# Patient Record
Sex: Male | Born: 1955
Health system: Southern US, Community
[De-identification: ages and names within clinical notes are randomized; demographics above are authoritative.]

## PROBLEM LIST (undated history)

## (undated) DIAGNOSIS — F419 Anxiety disorder, unspecified: Secondary | ICD-10-CM

## (undated) DIAGNOSIS — R519 Headache, unspecified: Secondary | ICD-10-CM

## (undated) DIAGNOSIS — G709 Myoneural disorder, unspecified: Secondary | ICD-10-CM

## (undated) DIAGNOSIS — N400 Enlarged prostate without lower urinary tract symptoms: Secondary | ICD-10-CM

## (undated) DIAGNOSIS — T8859XA Other complications of anesthesia, initial encounter: Secondary | ICD-10-CM

## (undated) DIAGNOSIS — I499 Cardiac arrhythmia, unspecified: Secondary | ICD-10-CM

## (undated) DIAGNOSIS — I10 Essential (primary) hypertension: Secondary | ICD-10-CM

## (undated) DIAGNOSIS — E7800 Pure hypercholesterolemia, unspecified: Secondary | ICD-10-CM

## (undated) DIAGNOSIS — Z8679 Personal history of other diseases of the circulatory system: Secondary | ICD-10-CM

## (undated) DIAGNOSIS — G4730 Sleep apnea, unspecified: Secondary | ICD-10-CM

## (undated) DIAGNOSIS — I493 Ventricular premature depolarization: Secondary | ICD-10-CM

## (undated) DIAGNOSIS — M1990 Unspecified osteoarthritis, unspecified site: Secondary | ICD-10-CM

## (undated) DIAGNOSIS — E119 Type 2 diabetes mellitus without complications: Secondary | ICD-10-CM

## (undated) DIAGNOSIS — E785 Hyperlipidemia, unspecified: Secondary | ICD-10-CM

## (undated) DIAGNOSIS — H548 Legal blindness, as defined in USA: Secondary | ICD-10-CM

## (undated) DIAGNOSIS — I2510 Atherosclerotic heart disease of native coronary artery without angina pectoris: Secondary | ICD-10-CM

## (undated) DIAGNOSIS — J189 Pneumonia, unspecified organism: Secondary | ICD-10-CM

## (undated) DIAGNOSIS — G629 Polyneuropathy, unspecified: Secondary | ICD-10-CM

## (undated) DIAGNOSIS — J449 Chronic obstructive pulmonary disease, unspecified: Secondary | ICD-10-CM

## (undated) HISTORY — DX: Anxiety disorder, unspecified: F41.9

## (undated) HISTORY — PX: HERNIA REPAIR: SHX51

## (undated) HISTORY — DX: Legal blindness, as defined in USA: H54.8

## (undated) HISTORY — DX: Hyperlipidemia, unspecified: E78.5

## (undated) HISTORY — PX: CARDIAC SURGERY: SHX584

## (undated) HISTORY — PX: CARPAL TUNNEL RELEASE: SHX101

## (undated) HISTORY — PX: ULNAR NERVE REPAIR: SHX2594

## (undated) HISTORY — DX: Essential (primary) hypertension: I10

## (undated) HISTORY — PX: TONSILLECTOMY: SUR1361

## (undated) HISTORY — PX: JOINT REPLACEMENT: SHX530

## (undated) HISTORY — DX: Type 2 diabetes mellitus without complications: E11.9

## (undated) HISTORY — PX: BACK SURGERY: SHX140

## (undated) HISTORY — PX: HAND SURGERY: SHX662

---

## 1998-10-08 DIAGNOSIS — I213 ST elevation (STEMI) myocardial infarction of unspecified site: Secondary | ICD-10-CM

## 1998-10-08 HISTORY — DX: ST elevation (STEMI) myocardial infarction of unspecified site: I21.3

## 2007-05-20 ENCOUNTER — Ambulatory Visit: Payer: Self-pay | Admitting: Internal Medicine

## 2009-08-21 ENCOUNTER — Emergency Department: Payer: Self-pay | Admitting: Emergency Medicine

## 2012-08-13 ENCOUNTER — Ambulatory Visit: Payer: Self-pay | Admitting: Pain Medicine

## 2013-08-28 DIAGNOSIS — R7989 Other specified abnormal findings of blood chemistry: Secondary | ICD-10-CM | POA: Insufficient documentation

## 2013-08-28 DIAGNOSIS — G8929 Other chronic pain: Secondary | ICD-10-CM | POA: Insufficient documentation

## 2013-08-28 DIAGNOSIS — L920 Granuloma annulare: Secondary | ICD-10-CM | POA: Insufficient documentation

## 2013-08-28 DIAGNOSIS — E7800 Pure hypercholesterolemia, unspecified: Secondary | ICD-10-CM | POA: Insufficient documentation

## 2013-08-28 DIAGNOSIS — I2510 Atherosclerotic heart disease of native coronary artery without angina pectoris: Secondary | ICD-10-CM | POA: Insufficient documentation

## 2013-08-28 DIAGNOSIS — N400 Enlarged prostate without lower urinary tract symptoms: Secondary | ICD-10-CM | POA: Insufficient documentation

## 2013-08-28 DIAGNOSIS — I10 Essential (primary) hypertension: Secondary | ICD-10-CM | POA: Insufficient documentation

## 2013-08-28 DIAGNOSIS — G629 Polyneuropathy, unspecified: Secondary | ICD-10-CM | POA: Insufficient documentation

## 2013-08-28 DIAGNOSIS — G5622 Lesion of ulnar nerve, left upper limb: Secondary | ICD-10-CM | POA: Insufficient documentation

## 2013-08-28 DIAGNOSIS — H543 Unqualified visual loss, both eyes: Secondary | ICD-10-CM | POA: Insufficient documentation

## 2013-08-28 DIAGNOSIS — F419 Anxiety disorder, unspecified: Secondary | ICD-10-CM | POA: Insufficient documentation

## 2013-11-12 DIAGNOSIS — M67439 Ganglion, unspecified wrist: Secondary | ICD-10-CM | POA: Insufficient documentation

## 2013-12-11 DIAGNOSIS — G4733 Obstructive sleep apnea (adult) (pediatric): Secondary | ICD-10-CM | POA: Insufficient documentation

## 2013-12-11 DIAGNOSIS — F5104 Psychophysiologic insomnia: Secondary | ICD-10-CM | POA: Insufficient documentation

## 2013-12-11 DIAGNOSIS — G43909 Migraine, unspecified, not intractable, without status migrainosus: Secondary | ICD-10-CM | POA: Insufficient documentation

## 2014-01-28 DIAGNOSIS — G5601 Carpal tunnel syndrome, right upper limb: Secondary | ICD-10-CM | POA: Insufficient documentation

## 2014-12-06 DIAGNOSIS — Z9862 Peripheral vascular angioplasty status: Secondary | ICD-10-CM | POA: Insufficient documentation

## 2015-01-17 ENCOUNTER — Ambulatory Visit
Admit: 2015-01-17 | Disposition: A | Discharge status: Home / Self Care | Payer: Self-pay | Attending: Cardiology | Admitting: Cardiology

## 2016-01-11 ENCOUNTER — Ambulatory Visit
Admission: EM | Admit: 2016-01-11 | Discharge: 2016-01-11 | Disposition: A | Discharge status: Home / Self Care | Payer: Managed Care, Other (non HMO) | Attending: Family Medicine | Admitting: Family Medicine

## 2016-01-11 ENCOUNTER — Encounter: Payer: Self-pay | Admitting: *Deleted

## 2016-01-11 ENCOUNTER — Ambulatory Visit (INDEPENDENT_AMBULATORY_CARE_PROVIDER_SITE_OTHER): Payer: Managed Care, Other (non HMO)

## 2016-01-11 DIAGNOSIS — S8011XA Contusion of right lower leg, initial encounter: Secondary | ICD-10-CM

## 2016-01-11 DIAGNOSIS — S81811A Laceration without foreign body, right lower leg, initial encounter: Secondary | ICD-10-CM

## 2016-01-11 HISTORY — DX: Sleep apnea, unspecified: G47.30

## 2016-01-11 HISTORY — DX: Type 2 diabetes mellitus without complications: E11.9

## 2016-01-11 HISTORY — DX: Pure hypercholesterolemia, unspecified: E78.00

## 2016-01-11 HISTORY — DX: Atherosclerotic heart disease of native coronary artery without angina pectoris: I25.10

## 2016-01-11 HISTORY — DX: Polyneuropathy, unspecified: G62.9

## 2016-01-11 HISTORY — DX: Chronic obstructive pulmonary disease, unspecified: J44.9

## 2016-01-11 HISTORY — DX: Essential (primary) hypertension: I10

## 2016-01-11 HISTORY — DX: Benign prostatic hyperplasia without lower urinary tract symptoms: N40.0

## 2016-01-11 MED ORDER — CEPHALEXIN 500 MG PO CAPS: 500.0000 mg | ORAL_CAPSULE | Freq: Three times a day (TID) | ORAL | Status: AC

## 2016-01-11 MED ORDER — LIDOCAINE HCL (PF) 1 % IJ SOLN: 10.0000 mL | Freq: Once | INTRAMUSCULAR | Status: DC

## 2016-01-11 MED ORDER — BACITRACIN ZINC 500 UNIT/GM EX OINT: TOPICAL_OINTMENT | Freq: Once | CUTANEOUS | Status: DC

## 2016-01-11 NOTE — Discharge Instructions (Signed)
Take medication as prescribed. Elevate. Keep clean. Apply ice. Clean daily with soap and water and then apply topical antibiotic ointment such as neosporin.   Follow up with your primary care physician or return to Urgent care in 10 days for suture removal.   Follow up with your primary care physician this week as needed. Return to Urgent care or ER sooner for redness, pain, drainage, new or worsening concerns.    Contusion A contusion is a deep bruise. Contusions are the result of a blunt injury to tissues and muscle fibers under the skin. The injury causes bleeding under the skin. The skin overlying the contusion may turn blue, purple, or yellow. Minor injuries will give you a painless contusion, but more severe contusions may stay painful and swollen for a few weeks.  CAUSES  This condition is usually caused by a blow, trauma, or direct force to an area of the body. SYMPTOMS  Symptoms of this condition include:  Swelling of the injured area.  Pain and tenderness in the injured area.  Discoloration. The area may have redness and then turn blue, purple, or yellow. DIAGNOSIS  This condition is diagnosed based on a physical exam and medical history. An X-ray, CT scan, or MRI may be needed to determine if there are any associated injuries, such as broken bones (fractures). TREATMENT  Specific treatment for this condition depends on what area of the body was injured. In general, the best treatment for a contusion is resting, icing, applying pressure to (compression), and elevating the injured area. This is often called the RICE strategy. Over-the-counter anti-inflammatory medicines may also be recommended for pain control.  HOME CARE INSTRUCTIONS   Rest the injured area.  If directed, apply ice to the injured area:  Put ice in a plastic bag.  Place a towel between your skin and the bag.  Leave the ice on for 20 minutes, 2-3 times per day.  If directed, apply light compression to the  injured area using an elastic bandage. Make sure the bandage is not wrapped too tightly. Remove and reapply the bandage as directed by your health care provider.  If possible, raise (elevate) the injured area above the level of your heart while you are sitting or lying down.  Take over-the-counter and prescription medicines only as told by your health care provider. SEEK MEDICAL CARE IF:  Your symptoms do not improve after several days of treatment.  Your symptoms get worse.  You have difficulty moving the injured area. SEEK IMMEDIATE MEDICAL CARE IF:   You have severe pain.  You have numbness in a hand or foot.  Your hand or foot turns pale or cold.   This information is not intended to replace advice given to you by your health care provider. Make sure you discuss any questions you have with your health care provider.   Document Released: 07/04/2005 Document Revised: 06/15/2015 Document Reviewed: 02/09/2015 Elsevier Interactive Patient Education 2016 [employer]  Laceration Care, Adult A laceration is a cut that goes through all of the layers of the skin and into the tissue that is right under the skin. Some lacerations heal on their own. Others need to be closed with stitches (sutures), staples, skin adhesive strips, or skin glue. Proper laceration care minimizes the risk of infection and helps the laceration to heal better. HOW TO CARE FOR YOUR LACERATION If sutures or staples were used:  Keep the wound clean and dry.  If you were given a bandage (dressing), you  should change it at least one time per day or as told by your health care provider. You should also change it if it becomes wet or dirty.  Keep the wound completely dry for the first 24 hours or as told by your health care provider. After that time, you may shower or bathe. However, make sure that the wound is not soaked in water until after the sutures or staples have been removed.  Clean the wound one time each  day or as told by your health care provider:  Wash the wound with soap and water.  Rinse the wound with water to remove all soap.  Pat the wound dry with a clean towel. Do not rub the wound.  After cleaning the wound, apply a thin layer of antibiotic ointmentas told by your health care provider. This will help to prevent infection and keep the dressing from sticking to the wound.  Have the sutures or staples removed as told by your health care provider. If skin adhesive strips were used:  Keep the wound clean and dry.  If you were given a bandage (dressing), you should change it at least one time per day or as told by your health care provider. You should also change it if it becomes dirty or wet.  Do not get the skin adhesive strips wet. You may shower or bathe, but be careful to keep the wound dry.  If the wound gets wet, pat it dry with a clean towel. Do not rub the wound.  Skin adhesive strips fall off on their own. You may trim the strips as the wound heals. Do not remove skin adhesive strips that are still stuck to the wound. They will fall off in time. If skin glue was used:  Try to keep the wound dry, but you may briefly wet it in the shower or bath. Do not soak the wound in water, such as by swimming.  After you have showered or bathed, gently pat the wound dry with a clean towel. Do not rub the wound.  Do not do any activities that will make you sweat heavily until the skin glue has fallen off on its own.  Do not apply liquid, cream, or ointment medicine to the wound while the skin glue is in place. Using those may loosen the film before the wound has healed.  If you were given a bandage (dressing), you should change it at least one time per day or as told by your health care provider. You should also change it if it becomes dirty or wet.  If a dressing is placed over the wound, be careful not to apply tape directly over the skin glue. Doing that may cause the glue to be  pulled off before the wound has healed.  Do not pick at the glue. The skin glue usually remains in place for 5-10 days, then it falls off of the skin. General Instructions  Take over-the-counter and prescription medicines only as told by your health care provider.  If you were prescribed an antibiotic medicine or ointment, take or apply it as told by your doctor. Do not stop using it even if your condition improves.  To help prevent scarring, make sure to cover your wound with sunscreen whenever you are outside after stitches are removed, after adhesive strips are removed, or when glue remains in place and the wound is healed. Make sure to wear a sunscreen of at least 30 SPF.  Do not scratch or  pick at the wound.  Keep all follow-up visits as told by your health care provider. This is important.  Check your wound every day for signs of infection. Watch for:  Redness, swelling, or pain.  Fluid, blood, or pus.  Raise (elevate) the injured area above the level of your heart while you are sitting or lying down, if possible. SEEK MEDICAL CARE IF:  You received a tetanus shot and you have swelling, severe pain, redness, or bleeding at the injection site.  You have a fever.  A wound that was closed breaks open.  You notice a bad smell coming from your wound or your dressing.  You notice something coming out of the wound, such as wood or glass.  Your pain is not controlled with medicine.  You have increased redness, swelling, or pain at the site of your wound.  You have fluid, blood, or pus coming from your wound.  You notice a change in the color of your skin near your wound.  You need to change the dressing frequently due to fluid, blood, or pus draining from the wound.  You develop a new rash.  You develop numbness around the wound. SEEK IMMEDIATE MEDICAL CARE IF:  You develop severe swelling around the wound.  Your pain suddenly increases and is severe.  You develop  painful lumps near the wound or on skin that is anywhere on your body.  You have a red streak going away from your wound.  The wound is on your hand or foot and you cannot properly move a finger or toe.  The wound is on your hand or foot and you notice that your fingers or toes look pale or bluish.   This information is not intended to replace advice given to you by your health care provider. Make sure you discuss any questions you have with your health care provider.   Document Released: 09/24/2005 Document Revised: 02/08/2015 Document Reviewed: 09/20/2014 Elsevier Interactive Patient Education [employer]

## 2016-01-11 NOTE — ED Notes (Signed)
Patient struck his right leg just below his knee today. A small laceration is present . The patient has had a right knee replacement and is concerned that it may be damaged.

## 2016-01-11 NOTE — ED Provider Notes (Signed)
[facility]  ____________________________________________  Time seen: Approximately 5:49 PM  I have reviewed the triage vital signs and the nursing notes.   HISTORY  Chief Complaint Leg Injury   HPI Bryan Blair is a 60 y.o. male presents for the complaint of right anterior knee pain with laceration. 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 Patient states that he had some loose boards and was using his hammer to fix the boards. Patient states that he hit the board with a hammer and it bounced off and bounced into his right lower knee. Patient states that this impacted causing laceration. Patient states that he does have some pain present described as 3 out of 10 described as mild.  states pain is primarily with direct palpation. Denies pain radiation. Denies numbness or tingling sensation. Patient reports that he was concerned as he has had a right total knee replacement and states that the impact was just below the plate. Denies fall to the ground. Denies other pain or injury.  Reports has remained ambulatory since. Denies head injury or loss consciousness. Denies neck or back pain. Reports last tetanus immunization was last year.  PCP [facility]    Past Medical History  Diagnosis Date  . Coronary artery disease   . Hypertension   . COPD (chronic obstructive pulmonary disease) (HCC)   . Diabetes mellitus without complication (HCC)   . Sleep apnea   . Hypercholesteremia   . BPH (benign prostatic hyperplasia)   . Neuropathy (HCC)     There are no active problems to display for this patient.   Past Surgical History  Procedure Laterality Date  . Cardiac surgery    . Joint replacement    . Hernia repair    . Tonsillectomy    . Back surgery    . Hand surgery      Current Outpatient Rx  Name  Route  Sig  Dispense  Refill  . ALPRAZolam (XANAX) 0.5 MG tablet   Oral   Take 0.5 mg by mouth 3 (three) times daily as  needed for anxiety.         Marland Kitchen aspirin 81 MG tablet   Oral   Take 81 mg by mouth 2 (two) times daily.         Marland Kitchen glipiZIDE (GLUCOTROL) 10 MG tablet   Oral   Take 10 mg by mouth 2 (two) times daily before a meal.         . isosorbide mononitrate (IMDUR) 30 MG 24 hr tablet   Oral   Take 30 mg by mouth daily.         . tamsulosin (FLOMAX) 0.4 MG CAPS capsule   Oral   Take 0.4 mg by mouth daily.         . carisoprodol (SOMA) 350 MG tablet   Oral   Take 350 mg by mouth 4 (four) times daily as needed for muscle spasms.         . nitroGLYCERIN (NITROSTAT) 0.4 MG SL tablet   Sublingual   Place 0.4 mg under the tongue every 5 (five) minutes as needed for chest pain.           Allergies Iodine; Shellfish allergy; Sodium hyaluronate; Prochlorperazine maleate; and Venomil honey bee venom  History reviewed. No pertinent family history.  Social History Social History  Substance Use Topics  . Smoking status: Current Every Day Smoker  . Smokeless tobacco: Former Neurosurgeon  .  Alcohol Use: No    Review of Systems Constitutional: No fever/chills Eyes: No visual changes. ENT: No sore throat. Cardiovascular: Denies chest pain. Respiratory: Denies shortness of breath. Gastrointestinal: No abdominal pain.  No nausea, no vomiting.  No diarrhea.  No constipation. Genitourinary: Negative for dysuria. Musculoskeletal: Negative for back pain. Skin: Negative for rash. laceration as above.  Neurological: Negative for headaches, focal weakness or numbness.  10-point ROS otherwise negative.  ____________________________________________   PHYSICAL EXAM:  VITAL SIGNS: ED Triage Vitals  Enc Vitals Group     BP 01/11/16 1654 114/100 mmHg     Pulse Rate 01/11/16 1654 88     Resp 01/11/16 1654 18     Temp 01/11/16 1654 98.1 F (36.7 C)     Temp Source 01/11/16 1654 Oral     SpO2 01/11/16 1654 98 %     Weight 01/11/16 1654 300 lb (136.079 kg)     Height 01/11/16 1654   (1.702 m)     Head Cir --      Peak Flow --      Pain Score 01/11/16 1710 0     Pain Loc --      Pain Edu? --      Excl. in GC? --    Today's Vitals   01/11/16 1654 01/11/16 1710 01/11/16 1824  BP: 114/100  119/85  Pulse: 88  96  Temp: 98.1 F (36.7 C)    TempSrc: Oral    Resp: 18    Height:  (1.702 m)    Weight: 300 lb (136.079 kg)    SpO2: 98%    PainSc:  0-No pain 4      Constitutional: Alert and oriented. Well appearing and in no acute distress. Eyes: Conjunctivae are normal. PERRL. EOMI. Head: Atraumatic.  Nose: No congestion/rhinnorhea.  Mouth/Throat: Mucous membranes are moist.   Neck: No stridor.  No cervical spine tenderness to palpation. Cardiovascular: Normal rate, regular rhythm. Grossly normal heart sounds.  Good peripheral circulation. Respiratory: Normal respiratory effort.  No retractions. Lungs CTAB. Gastrointestinal: Soft and nontender.  Musculoskeletal: No lower or upper extremity tenderness nor edema.   Bilateral pedal pulses equal and easily palpated. Right anterior proximal tibial mild to moderate tenderness to palpation, with 3cm laceration present with mild surrounding ecchymosis, no erythema, no exudate or drainage, mild active bleeding, no foreign bodies visualized. Right knee nontender to palpation, full range of motion present and no pain with range of motion. Knee appears stable. Right lower extremity otherwise nontender. Ambulatory in room with steady gait.  Neurologic:  Normal speech and language. No gross focal neurologic deficits are appreciated. No gait instability. Skin:  Skin is warm, dry and intact. No rash noted. Psychiatric: Mood and affect are normal. Speech and behavior are normal.  ____________________________________________   LABS (all labs ordered are listed, but only abnormal results are displayed)  Labs Reviewed - No data to display  RADIOLOGY  EXAM: RIGHT TIBIA AND FIBULA - 2 VIEW  COMPARISON:  None.  FINDINGS: The marker indicates laceration is in the vicinity of the tibial tubercle.  No unexpected foreign body or gas tracking in the soft tissues. No fracture or complicating feature related to the tibial portion of the total knee prosthesis.  IMPRESSION: 1. No bony abnormality or complicating feature related to the tibial portion of the total knee prosthesis.   Electronically Signed By: Gaylyn Rong M.D. On: 01/11/2016 18:12 I, Renford Dills, personally discussed these images and results by phone with the  on-call radiologist and used this discussion as part of my medical decision making.   ____________________________________________   PROCEDURES  Procedure(s) performed:  Procedure(s) performed:  Procedure explained and verbal consent obtained. Consent: Verbal consent obtained. Written consent not obtained. Risks and benefits: risks, benefits and alternatives were discussed Patient identity confirmed: verbally with patient and hospital-assigned identification number  Consent given by: patient   Laceration Repair Location: right anterior proximal tibia Length: 3cm Foreign bodies: no foreign bodies Tendon involvement: none Nerve involvement: none Preparation: Patient was prepped and draped in the usual sterile fashion. Anesthesia with 1% Lidocaine 6 mls Irrigation solution: saline and betadine Irrigation method: jet lavage Amount of cleaning: copious Repaired with 4-0 nylon  Number of sutures: 4 Technique: simple interrupted  Approximation: loose Patient tolerate well. Wound well approximated post repair.  Antibiotic ointment and dressing applied.  Wound care instructions provided.  Observe for any signs of infection or other problems.     ____________________________________________   INITIAL IMPRESSION / ASSESSMENT AND PLAN / ED COURSE  Pertinent labs & imaging results that were available during my care of the patient were reviewed by me  and considered in my medical decision making (see chart for details).  Well-appearing patient. No acute distress. Presents for the complaints of right anterior proximal tibial pain and laceration post mechanical injury, but accidentally hitting himself with a hammer. Denies fall to the ground. Denies other pain or injury. Tetanus immunization up-to-date. Will about evaluate tib-fib x-ray. Laceration repaired. Xray negative per radiologist. Patient tolerated well. As patient with lower extremity laceration and diabetic will place patient on oral cephalexin. Denies need for pain medication. Encouraged wound monitoring and discussed cleaning. Discussed return to urgent care or follow up with PCP in 10 days for suture removal. Discussed indication, risks and benefits of medications with patient.  Discussed follow up with Primary care physician this week. Discussed follow up and return parameters including no resolution or any worsening concerns. Patient verbalized understanding and agreed to plan.   ____________________________________________   FINAL CLINICAL IMPRESSION(S) / ED DIAGNOSES  Final diagnoses:  Contusion, lower leg, right, initial encounter  Laceration of lower leg, right, initial encounter      Note: This dictation was prepared with Dragon dictation along with smaller phrase technology. Any transcriptional errors that result from this process are unintentional.    Renford DillsLindsey Aemon Koeller, NP 01/11/16 1918

## 2016-08-24 DIAGNOSIS — E1142 Type 2 diabetes mellitus with diabetic polyneuropathy: Secondary | ICD-10-CM | POA: Insufficient documentation

## 2017-04-12 IMAGING — CR DG TIBIA/FIBULA 2V*R*
2 series · 2 of 2 positions shown · non-contrast
Comparison: None.

CLINICAL DATA: Anterior laceration below the knee after injury.

EXAM:
RIGHT TIBIA AND FIBULA - 2 VIEW

[tibia ap]
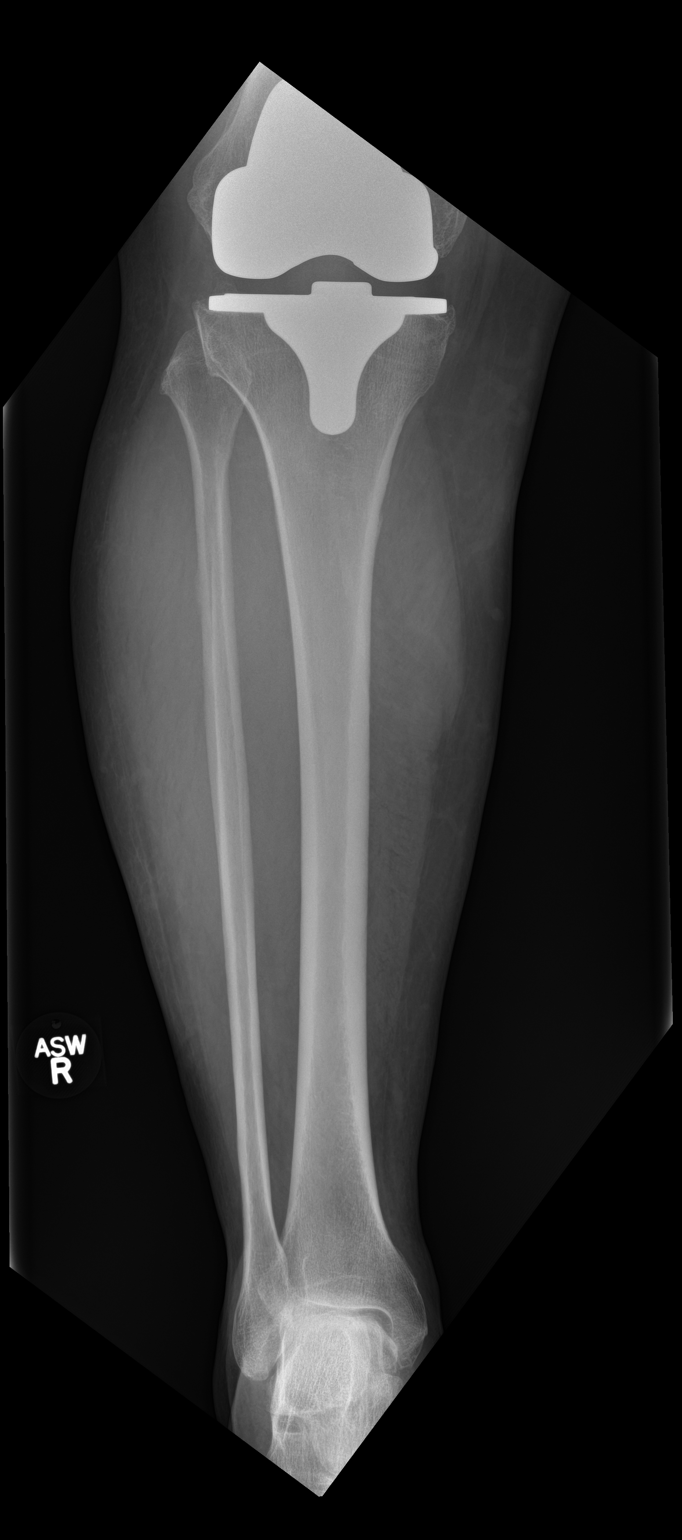

[tibia lat]
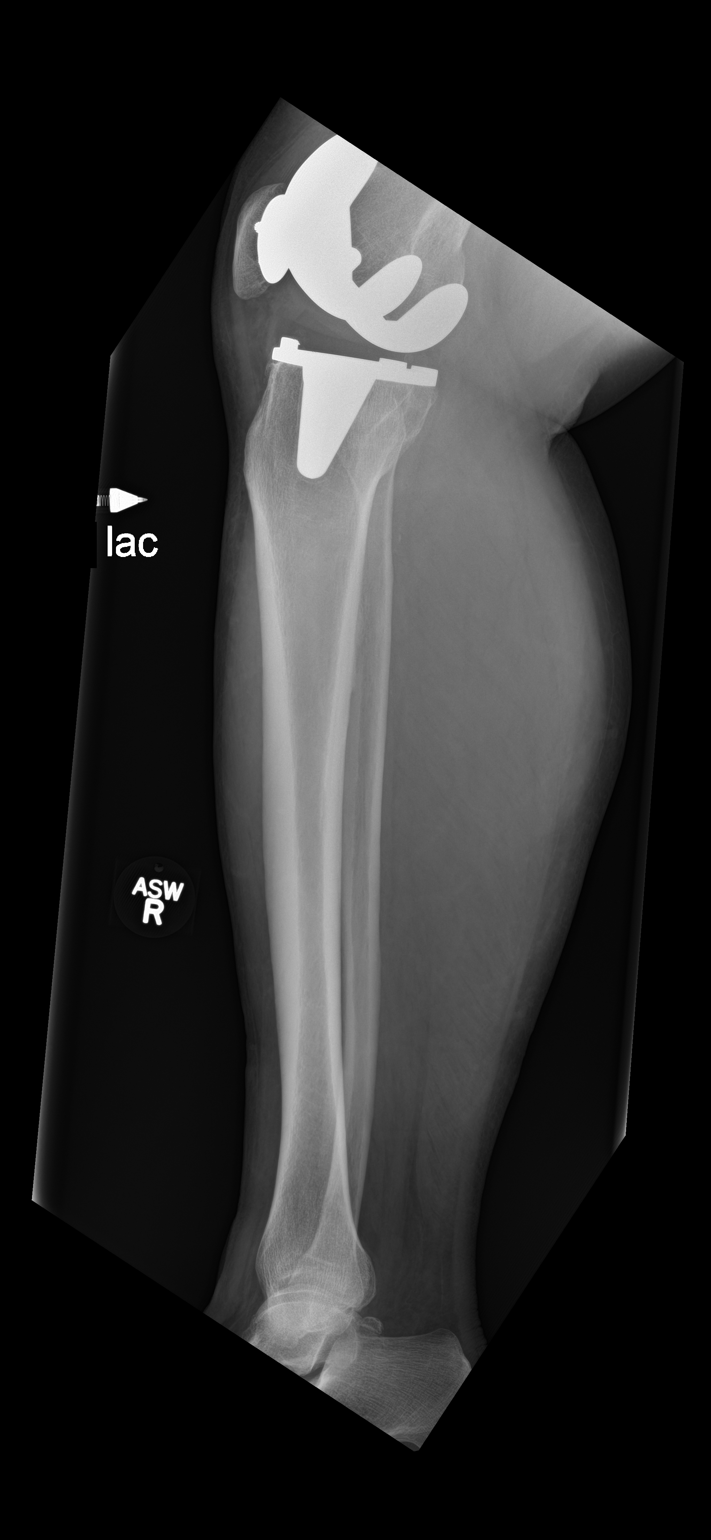

[2 of 2 positions shown; findings below may reference images not displayed]

FINDINGS: The marker indicates laceration is in the vicinity of the tibial
tubercle.

No unexpected foreign body or gas tracking in the soft tissues. No
fracture or complicating feature related to the tibial portion of
the total knee prosthesis.
IMPRESSION: 1. No bony abnormality or complicating feature related to the tibial
portion of the total knee prosthesis.

## 2019-07-13 ENCOUNTER — Encounter: Payer: Self-pay | Admitting: Internal Medicine

## 2020-07-04 DIAGNOSIS — H548 Legal blindness, as defined in USA: Secondary | ICD-10-CM | POA: Insufficient documentation

## 2020-07-04 DIAGNOSIS — J449 Chronic obstructive pulmonary disease, unspecified: Secondary | ICD-10-CM | POA: Insufficient documentation

## 2020-07-04 DIAGNOSIS — I252 Old myocardial infarction: Secondary | ICD-10-CM | POA: Insufficient documentation

## 2020-10-10 ENCOUNTER — Ambulatory Visit: Payer: Managed Care, Other (non HMO) | Admitting: Cardiology

## 2020-10-18 DIAGNOSIS — Z23 Encounter for immunization: Secondary | ICD-10-CM | POA: Diagnosis not present

## 2020-10-18 DIAGNOSIS — E119 Type 2 diabetes mellitus without complications: Secondary | ICD-10-CM | POA: Diagnosis not present

## 2020-10-18 DIAGNOSIS — Z720 Tobacco use: Secondary | ICD-10-CM | POA: Diagnosis not present

## 2020-10-18 DIAGNOSIS — R69 Illness, unspecified: Secondary | ICD-10-CM | POA: Diagnosis not present

## 2020-10-18 DIAGNOSIS — J449 Chronic obstructive pulmonary disease, unspecified: Secondary | ICD-10-CM | POA: Diagnosis not present

## 2020-10-18 DIAGNOSIS — H548 Legal blindness, as defined in USA: Secondary | ICD-10-CM | POA: Diagnosis not present

## 2020-10-18 DIAGNOSIS — Z6841 Body Mass Index (BMI) 40.0 and over, adult: Secondary | ICD-10-CM | POA: Diagnosis not present

## 2020-10-18 DIAGNOSIS — I252 Old myocardial infarction: Secondary | ICD-10-CM | POA: Diagnosis not present

## 2020-10-18 DIAGNOSIS — I200 Unstable angina: Secondary | ICD-10-CM | POA: Diagnosis not present

## 2020-11-17 ENCOUNTER — Encounter: Payer: Self-pay | Admitting: Cardiology

## 2020-11-17 ENCOUNTER — Ambulatory Visit: Payer: Medicare HMO | Admitting: Cardiology

## 2020-11-17 ENCOUNTER — Encounter: Payer: Self-pay | Admitting: *Deleted

## 2020-11-17 VITALS — BP 126/70 | HR 68 | Ht 67.0 in | Wt 293.0 lb

## 2020-11-17 DIAGNOSIS — E782 Mixed hyperlipidemia: Secondary | ICD-10-CM

## 2020-11-17 DIAGNOSIS — I25119 Atherosclerotic heart disease of native coronary artery with unspecified angina pectoris: Secondary | ICD-10-CM

## 2020-11-17 DIAGNOSIS — Z720 Tobacco use: Secondary | ICD-10-CM | POA: Diagnosis not present

## 2020-11-17 NOTE — Patient Instructions (Signed)
Your physician wants you to follow-up in: 1 YEAR WITH DR MCDOWELL  You will receive a reminder letter in the mail two months in advance. If you don't receive a letter, please call our office to schedule the follow-up appointment.   Your physician has recommended you make the following change in your medication:   DECREASE CRESTOR 10 MG DAILY    Thank you for choosing [facility]!!

## 2020-11-17 NOTE — Progress Notes (Signed)
Cardiology Office Note  Date: 11/17/2020   ID: Bryan Blair, DOB 07-06-1956, MRN [id number]  PCP:  [facility]  Cardiologist:  Nona Dell, MD Electrophysiologist:  None   Chief Complaint  Patient presents with  . Establish cardiac follow-up    History of Present Illness: Bryan Blair is a 65 y.o. male referred for cardiology consultation by Ms. Boles PA-C with [facility] to establish cardiac follow-up.  He reports occasional angina symptoms or nitroglycerin use, but overall tells me that he has been doing well.  He moved to [place] from [place], previously lived in [place], and prior to that was originally from [place].  Cardiac history is noted below.  He was previously followed by the [facility] (Dr. Darrold Junker) and reportedly follow-up cardiac catheterization in April 2016 revealed patent left circumflex stent site and otherwise nonobstructive disease.  He was then seen by [facility] in 2019.  Follow-up cardiac catheterization at that time reported minor coronary artery disease with negative IFR of the mid circumflex (1.0).  I reviewed his medications which are outlined below.  He has been concerned about possible neuropathy side effects related to Crestor, and prior to that Lipitor.  His last LDL was 46 in 2020, we are requesting his most recent lab work from [facility].  I have asked him to cut Crestor to 10 mg daily for now.  I personally reviewed his ECG which shows normal sinus rhythm.   Past Medical History:  Diagnosis Date  . Anxiety   . BPH (benign prostatic hyperplasia)   . COPD (chronic obstructive pulmonary disease) (HCC)   . Coronary artery disease    Angioplasty LAD 1995, angioplasty circumflex 1996, stent intervention circumflex 2000  . Essential hypertension   . Hyperlipidemia   . Legally blind   . Neuropathy   . Sleep apnea   . STEMI (ST elevation myocardial infarction) (HCC) 2000  . Type 2 diabetes mellitus  (HCC)     Past Surgical History:  Procedure Laterality Date  . BACK SURGERY    . HAND SURGERY    . HERNIA REPAIR    . JOINT REPLACEMENT    . TONSILLECTOMY      Current Outpatient Medications  Medication Sig Dispense Refill  . albuterol (VENTOLIN HFA) 108 (90 Base) MCG/ACT inhaler albuterol sulfate HFA 90 mcg/actuation aerosol inhaler    . ALPRAZolam (XANAX) 0.5 MG tablet Take 0.5 mg by mouth 3 (three) times daily as needed for anxiety.    Marland Kitchen aspirin 81 MG tablet Take 81 mg by mouth 2 (two) times daily.    . famotidine (PEPCID) 20 MG tablet Take 20 mg by mouth daily as needed.    . Fluticasone-Umeclidin-Vilant (TRELEGY ELLIPTA) 100-62.5-25 MCG/INH AEPB Inhale into the lungs daily.    Marland Kitchen glipiZIDE (GLUCOTROL) 10 MG tablet Take 10 mg by mouth 2 (two) times daily before a meal.    . guaiFENesin (MUCINEX) 600 MG 12 hr tablet Take 600 mg by mouth 2 (two) times daily.    Marland Kitchen ibuprofen (ADVIL) 200 MG tablet Take 200 mg by mouth every 8 (eight) hours as needed.    . isosorbide mononitrate (IMDUR) 30 MG 24 hr tablet Take 30 mg by mouth daily.    Marland Kitchen losartan (COZAAR) 25 MG tablet Take 25 mg by mouth daily.    . Multiple Vitamin (MULTIVITAMIN WITH MINERALS) TABS tablet Take 1 tablet by mouth daily.    . nitroGLYCERIN (NITROSTAT) 0.4 MG SL tablet Place 0.4 mg under the tongue every  5 (five) minutes as needed for chest pain.    . pioglitazone (ACTOS) 45 MG tablet Take 45 mg by mouth daily.    . rosuvastatin (CRESTOR) 20 MG tablet Take 20 mg by mouth daily.    . tamsulosin (FLOMAX) 0.4 MG CAPS capsule Take 0.4 mg by mouth daily.     No current facility-administered medications for this visit.   Allergies:  Iodine, Shellfish allergy, Sodium hyaluronate (avian), Prochlorperazine maleate, and Venomil honey bee venom [honey bee venom]   Social History: The patient  reports that he has been smoking cigarettes. He has quit using smokeless tobacco. He reports that he does not drink alcohol and does not use  drugs.   Family History: The patient's family history includes Heart disease in his father and mother; Stroke in his father.   ROS: Peripheral neuropathy affecting feet and lower legs.  No palpitations or syncope.  Physical Exam: VS:  BP 126/70 (BP Location: Left Arm)   Pulse 68   Ht 5\' 7"  (1.702 m)   Wt 293 lb (132.9 kg)   SpO2 96%   BMI 45.89 kg/m , BMI Body mass index is 45.89 kg/m.  Wt Readings from Last 3 Encounters:  11/17/20 293 lb (132.9 kg)  01/11/16 300 lb (136.1 kg)    General: Morbidly obese male, appears comfortable at rest. HEENT: Conjunctiva and lids normal, wearing a mask. Neck: Supple, no elevated JVP or carotid bruits, no thyromegaly. Lungs: Clear to auscultation, nonlabored breathing at rest. Cardiac: Regular rate and rhythm, no S3 or significant systolic murmur, no pericardial rub. Abdomen: Soft, nontender, bowel sounds present. Extremities: Mild lower leg edema, distal pulses 2+. Skin: Warm and dry. Musculoskeletal: No kyphosis. Neuropsychiatric: Alert and oriented x3, affect grossly appropriate.  ECG:  No old tracing available for review today.  Recent Labwork:  January 2020: Cholesterol 107, triglycerides 77, HDL 46, LDL 46, potassium 3.9, BUN 14, creatinine 0.98, AST 17, ALT 22 August 2019: Hemoglobin A1c 6.9%  Other Studies Reviewed Today:  No interval cardiac testing for review today.  Assessment and Plan:  1.  CAD status post LAD angioplasty in 1995, circumflex angioplasty in 1996, and most recently stent intervention to the circumflex in 2000 in the setting of STEMI.  Cardiac catheterization in 2019 at [facility] showed patent circumflex stent site and otherwise minor disease.  He reports intermittent angina symptoms, not progressive on medical therapy.  ECG is normal today.  Continue aspirin, Imdur, Cozaar, Crestor, and as needed nitroglycerin.  Currently not on beta-blocker with resting heart rate in the 60s.  2.  Mixed hyperlipidemia by  history, last LDL 46 in 2020.  Decrease Crestor to 10 mg daily given patient concerns about potential side effects.  Requesting most recent lab work from 2021.  3.  Tobacco abuse, smoking cessation would also be beneficial.  Medication Adjustments/Labs and Tests Ordered: Current medicines are reviewed at length with the patient today.  Concerns regarding medicines are outlined above.   Tests Ordered: Orders Placed This Encounter  Procedures  . EKG 12-Lead    Medication Changes: No orders of the defined types were placed in this encounter.   Disposition:  Follow up 1 year in the [place] office, sooner if needed.  Signed, [place], MD, [facility] 11/17/2020 11:04 AM    [facility] at [facility] [address] [place], [place], [place] Phone: [phone]; Fax: [phone]

## 2021-01-19 DIAGNOSIS — E119 Type 2 diabetes mellitus without complications: Secondary | ICD-10-CM | POA: Diagnosis not present

## 2021-01-19 DIAGNOSIS — I200 Unstable angina: Secondary | ICD-10-CM | POA: Diagnosis not present

## 2021-01-19 DIAGNOSIS — I252 Old myocardial infarction: Secondary | ICD-10-CM | POA: Diagnosis not present

## 2021-01-19 DIAGNOSIS — Z720 Tobacco use: Secondary | ICD-10-CM | POA: Diagnosis not present

## 2021-01-19 DIAGNOSIS — H548 Legal blindness, as defined in USA: Secondary | ICD-10-CM | POA: Diagnosis not present

## 2021-01-19 DIAGNOSIS — J449 Chronic obstructive pulmonary disease, unspecified: Secondary | ICD-10-CM | POA: Diagnosis not present

## 2021-01-19 DIAGNOSIS — R69 Illness, unspecified: Secondary | ICD-10-CM | POA: Diagnosis not present

## 2021-02-21 DIAGNOSIS — S8392XA Sprain of unspecified site of left knee, initial encounter: Secondary | ICD-10-CM | POA: Diagnosis not present

## 2021-02-21 DIAGNOSIS — S93505A Unspecified sprain of left lesser toe(s), initial encounter: Secondary | ICD-10-CM | POA: Diagnosis not present

## 2021-04-18 DIAGNOSIS — H52223 Regular astigmatism, bilateral: Secondary | ICD-10-CM | POA: Diagnosis not present

## 2021-04-18 DIAGNOSIS — Z0100 Encounter for examination of eyes and vision without abnormal findings: Secondary | ICD-10-CM | POA: Diagnosis not present

## 2021-04-18 DIAGNOSIS — H5213 Myopia, bilateral: Secondary | ICD-10-CM | POA: Diagnosis not present

## 2021-04-18 DIAGNOSIS — H15833 Staphyloma posticum, bilateral: Secondary | ICD-10-CM | POA: Diagnosis not present

## 2021-04-18 DIAGNOSIS — H1045 Other chronic allergic conjunctivitis: Secondary | ICD-10-CM | POA: Diagnosis not present

## 2021-04-18 DIAGNOSIS — H53023 Refractive amblyopia, bilateral: Secondary | ICD-10-CM | POA: Diagnosis not present

## 2021-04-18 DIAGNOSIS — E119 Type 2 diabetes mellitus without complications: Secondary | ICD-10-CM | POA: Diagnosis not present

## 2021-04-18 DIAGNOSIS — Z7984 Long term (current) use of oral hypoglycemic drugs: Secondary | ICD-10-CM | POA: Diagnosis not present

## 2021-07-20 DIAGNOSIS — R69 Illness, unspecified: Secondary | ICD-10-CM | POA: Diagnosis not present

## 2021-07-20 DIAGNOSIS — Z720 Tobacco use: Secondary | ICD-10-CM | POA: Diagnosis not present

## 2021-07-20 DIAGNOSIS — E119 Type 2 diabetes mellitus without complications: Secondary | ICD-10-CM | POA: Diagnosis not present

## 2021-07-20 DIAGNOSIS — Z23 Encounter for immunization: Secondary | ICD-10-CM | POA: Diagnosis not present

## 2021-07-20 DIAGNOSIS — I200 Unstable angina: Secondary | ICD-10-CM | POA: Diagnosis not present

## 2021-07-20 DIAGNOSIS — F419 Anxiety disorder, unspecified: Secondary | ICD-10-CM | POA: Diagnosis not present

## 2021-07-20 DIAGNOSIS — Z6841 Body Mass Index (BMI) 40.0 and over, adult: Secondary | ICD-10-CM | POA: Diagnosis not present

## 2021-07-20 DIAGNOSIS — I252 Old myocardial infarction: Secondary | ICD-10-CM | POA: Diagnosis not present

## 2021-07-20 DIAGNOSIS — H548 Legal blindness, as defined in USA: Secondary | ICD-10-CM | POA: Diagnosis not present

## 2021-07-20 DIAGNOSIS — J449 Chronic obstructive pulmonary disease, unspecified: Secondary | ICD-10-CM | POA: Diagnosis not present

## 2021-09-12 DIAGNOSIS — D7289 Other specified disorders of white blood cells: Secondary | ICD-10-CM | POA: Diagnosis not present

## 2021-09-12 DIAGNOSIS — Z7689 Persons encountering health services in other specified circumstances: Secondary | ICD-10-CM | POA: Diagnosis not present

## 2021-09-13 ENCOUNTER — Other Ambulatory Visit: Payer: Self-pay

## 2021-09-13 ENCOUNTER — Emergency Department (HOSPITAL_COMMUNITY): Payer: Medicare HMO

## 2021-09-13 ENCOUNTER — Encounter (HOSPITAL_COMMUNITY): Payer: Self-pay | Admitting: Emergency Medicine

## 2021-09-13 ENCOUNTER — Emergency Department (HOSPITAL_COMMUNITY)
Admission: EM | Admit: 2021-09-13 | Discharge: 2021-09-13 | Disposition: A | Discharge status: Home / Self Care | Payer: Medicare HMO | Attending: Emergency Medicine | Admitting: Emergency Medicine

## 2021-09-13 ENCOUNTER — Emergency Department (HOSPITAL_COMMUNITY)
Admission: EM | Admit: 2021-09-13 | Discharge: 2021-09-13 | Discharge status: Absent without leave | Payer: Self-pay | Source: Home / Self Care

## 2021-09-13 DIAGNOSIS — Z96698 Presence of other orthopedic joint implants: Secondary | ICD-10-CM | POA: Insufficient documentation

## 2021-09-13 DIAGNOSIS — R0602 Shortness of breath: Secondary | ICD-10-CM | POA: Diagnosis not present

## 2021-09-13 DIAGNOSIS — M542 Cervicalgia: Secondary | ICD-10-CM

## 2021-09-13 DIAGNOSIS — E119 Type 2 diabetes mellitus without complications: Secondary | ICD-10-CM | POA: Insufficient documentation

## 2021-09-13 DIAGNOSIS — I10 Essential (primary) hypertension: Secondary | ICD-10-CM | POA: Diagnosis not present

## 2021-09-13 DIAGNOSIS — I2510 Atherosclerotic heart disease of native coronary artery without angina pectoris: Secondary | ICD-10-CM | POA: Insufficient documentation

## 2021-09-13 DIAGNOSIS — J449 Chronic obstructive pulmonary disease, unspecified: Secondary | ICD-10-CM | POA: Diagnosis not present

## 2021-09-13 DIAGNOSIS — Z7982 Long term (current) use of aspirin: Secondary | ICD-10-CM | POA: Insufficient documentation

## 2021-09-13 DIAGNOSIS — M47812 Spondylosis without myelopathy or radiculopathy, cervical region: Secondary | ICD-10-CM | POA: Diagnosis not present

## 2021-09-13 DIAGNOSIS — R600 Localized edema: Secondary | ICD-10-CM | POA: Insufficient documentation

## 2021-09-13 DIAGNOSIS — Z87891 Personal history of nicotine dependence: Secondary | ICD-10-CM | POA: Diagnosis not present

## 2021-09-13 DIAGNOSIS — M7989 Other specified soft tissue disorders: Secondary | ICD-10-CM | POA: Diagnosis not present

## 2021-09-13 DIAGNOSIS — M79604 Pain in right leg: Secondary | ICD-10-CM | POA: Diagnosis not present

## 2021-09-13 LAB — COMPREHENSIVE METABOLIC PANEL
ALT: 16 U/L (ref 0–44)
AST: 16 U/L (ref 15–41)
Albumin: 3.8 g/dL (ref 3.5–5.0)
Alkaline Phosphatase: 55 U/L (ref 38–126)
Anion gap: 8 (ref 5–15)
BUN: 17 mg/dL (ref 8–23)
CO2: 30 mmol/L (ref 22–32)
Calcium: 9.3 mg/dL (ref 8.9–10.3)
Chloride: 102 mmol/L (ref 98–111)
Creatinine, Ser: 0.95 mg/dL (ref 0.61–1.24)
GFR, Estimated: >60 mL/min (ref >60)
Glucose, Bld: 112 mg/dL — ABNORMAL HIGH (ref 70–99)
Potassium: 4.6 mmol/L (ref 3.5–5.1)
Sodium: 140 mmol/L (ref 135–145)
Total Bilirubin: 0.3 mg/dL (ref 0.3–1.2)
Total Protein: 7 g/dL (ref 6.5–8.1)

## 2021-09-13 LAB — CBC WITH DIFFERENTIAL/PLATELET
Abs Immature Granulocytes: 0.02 10*3/uL (ref 0.00–0.07)
Basophils Absolute: 0.1 10*3/uL (ref 0.0–0.1)
Basophils Relative: 1 %
Eosinophils Absolute: 0.2 10*3/uL (ref 0.0–0.5)
Eosinophils Relative: 3 %
HCT: 40.8 % (ref 39.0–52.0)
Hemoglobin: 13.2 g/dL (ref 13.0–17.0)
Immature Granulocytes: 0 %
Lymphocytes Relative: 20 %
Lymphs Abs: 1.4 10*3/uL (ref 0.7–4.0)
MCH: 31.5 pg (ref 26.0–34.0)
MCHC: 32.4 g/dL (ref 30.0–36.0)
MCV: 97.4 fL (ref 80.0–100.0)
Monocytes Absolute: 0.7 10*3/uL (ref 0.1–1.0)
Monocytes Relative: 9 %
Neutro Abs: 4.8 10*3/uL (ref 1.7–7.7)
Neutrophils Relative %: 67 %
Platelets: 223 10*3/uL (ref 150–400)
RBC: 4.19 MIL/uL — ABNORMAL LOW (ref 4.22–5.81)
RDW: 13.8 % (ref 11.5–15.5)
WBC: 7.1 10*3/uL (ref 4.0–10.5)
nRBC: 0 % (ref 0.0–0.2)

## 2021-09-13 LAB — BRAIN NATRIURETIC PEPTIDE: B Natriuretic Peptide: 85 pg/mL (ref 0.0–100.0)

## 2021-09-13 NOTE — ED Notes (Signed)
Lab tech at bedside

## 2021-09-13 NOTE — ED Provider Notes (Signed)
Emergency Medicine Provider Triage Evaluation Note  Bryan Blair , a 65 y.o. male  was evaluated in triage.  Pt complains of 2 complaints.  Patient states he has had 2 to 3 weeks of right lower extremity swelling, erythema and tenderness.  This is waxing and waning but been present the whole time.  He has some mild left lower extremity edema but this is nothing compared to his right.  He states that he googled and it said that it could be DVT.  He does not know if it is anxiety or not but now he started to have some shortness of breath and chest pain.  He tried to follow-up with his doctor but he did not feel confident in their ability so presents here.  His second complaint is that he had a fall recently.  He was walking on handicap ramp that was moldy and wet he fell landed on his coccyx and has had neck pain with shooting pain down his right arm ever since then.  Patient's concern for possible damage to his cervical hardware.  No weakness.  Review of Systems  Positive: Chest pain, shortness of breath, right lower extremity swelling, fall, right arm paresthesia Negative: Fever  Physical Exam  Ht 5\' 7"  (1.702 m)   Wt (!) 142.9 kg   BMI 49.34 kg/m  Gen:   Awake, no distress   Resp:  Normal effort  MSK:   Moves extremities without difficulty, right lower extremity edema with erythema up to the proximal shin. Other:  No cervical tenderness to palpation  Medical Decision Making  Medically screening exam initiated at 6:44 AM.  Appropriate orders placed.  Bryan Blair was informed that the remainder of the evaluation will be completed by another provider, this initial triage assessment does not replace that evaluation, and the importance of remaining in the ED until their evaluation is complete.  Ultrasound, labs, cervical CT, chest x-ray, EKG ordered   [name], MD 09/13/21 [phone]

## 2021-09-13 NOTE — ED Notes (Signed)
US completed

## 2021-09-13 NOTE — ED Provider Notes (Signed)
[facility] EMERGENCY DEPARTMENT Provider Note   CSN: [id number] Arrival date & time: 09/13/21  0315     History Chief Complaint  Patient presents with   Leg Swelling    Bryan Blair is a 65 y.o. male.  Patient presents with complaint of right lower extremity pain and neck pain.  He states that he is noted swelling to the right lower extremity for the past 2 to 3 weeks.  He is concerned that it may be a clot and presents to the ER.  Also states that about 4 5 days ago he fell onto his bottom and had some neck pain since then.  Denies loss of consciousness.  Denies pain in any other extremity.  Denies fevers or cough or vomiting or diarrhea.  Denies chest pain or shortness of breath.      Past Medical History:  Diagnosis Date   Anxiety    BPH (benign prostatic hyperplasia)    COPD (chronic obstructive pulmonary disease) (HCC)    Coronary artery disease    Angioplasty LAD 1995, angioplasty circumflex 1996, stent intervention circumflex 2000   Essential hypertension    Hyperlipidemia    Legally blind    Neuropathy    Sleep apnea    STEMI (ST elevation myocardial infarction) (HCC) 2000   Type 2 diabetes mellitus (HCC)     There are no problems to display for this patient.   Past Surgical History:  Procedure Laterality Date   BACK SURGERY     HAND SURGERY     HERNIA REPAIR     JOINT REPLACEMENT     TONSILLECTOMY         Family History  Problem Relation Age of Onset   Heart disease Mother    Heart disease Father    Stroke Father     Social History   Tobacco Use   Smoking status: Every Day    Types: Cigarettes   Smokeless tobacco: Former  Building services engineer Use: Former  Substance Use Topics   Alcohol use: No   Drug use: No    Home Medications Prior to Admission medications   Medication Sig Start Date End Date Taking? Authorizing Provider  albuterol (VENTOLIN HFA) 108 (90 Base) MCG/ACT inhaler albuterol sulfate HFA 90 mcg/actuation aerosol  inhaler 07/04/20   [provider]  ALPRAZolam Prudy Feeler) 0.5 MG tablet Take 0.5 mg by mouth 3 (three) times daily as needed for anxiety.    [provider]  aspirin 81 MG tablet Take 81 mg by mouth 2 (two) times daily.    [provider]  famotidine (PEPCID) 20 MG tablet Take 20 mg by mouth daily as needed.    [provider]  Fluticasone-Umeclidin-Vilant (TRELEGY ELLIPTA) 100-62.5-25 MCG/INH AEPB Inhale into the lungs daily.    [provider]  glipiZIDE (GLUCOTROL) 10 MG tablet Take 10 mg by mouth 2 (two) times daily before a meal.    [provider]  guaiFENesin (MUCINEX) 600 MG 12 hr tablet Take 600 mg by mouth 2 (two) times daily.    [provider]  ibuprofen (ADVIL) 200 MG tablet Take 200 mg by mouth every 8 (eight) hours as needed.    [provider]  isosorbide mononitrate (IMDUR) 30 MG 24 hr tablet Take 30 mg by mouth daily.    [provider]  losartan (COZAAR) 25 MG tablet Take 25 mg by mouth daily. 10/19/20   [provider]  Multiple Vitamin (MULTIVITAMIN WITH MINERALS) TABS tablet  Take 1 tablet by mouth daily.    [provider]  nitroGLYCERIN (NITROSTAT) 0.4 MG SL tablet Place 0.4 mg under the tongue every 5 (five) minutes as needed for chest pain.    [provider]  pioglitazone (ACTOS) 45 MG tablet Take 45 mg by mouth daily. 07/09/18   [provider]  rosuvastatin (CRESTOR) 20 MG tablet Take 10 mg by mouth daily. 10/19/20   [provider]  tamsulosin (FLOMAX) 0.4 MG CAPS capsule Take 0.4 mg by mouth daily.    [provider]    Allergies    Iodine, Shellfish allergy, Sodium hyaluronate (avian), Prochlorperazine maleate, and Venomil honey bee venom [honey bee venom]  Review of Systems   Review of Systems  Constitutional:  Negative for fever.  HENT:  Negative for ear pain and sore throat.   Eyes:  Negative for pain.  Respiratory:  Negative for  cough.   Cardiovascular:  Negative for chest pain.  Gastrointestinal:  Negative for abdominal pain.  Genitourinary:  Negative for flank pain.  Musculoskeletal:  Negative for back pain.  Skin:  Negative for color change and rash.  Neurological:  Negative for syncope.  All other systems reviewed and are negative.  Physical Exam Updated Vital Signs BP (!) 153/65   Pulse 74   Temp 98.3 F (36.8 C) (Oral)   Resp (!) 23   Ht 5\' 7"  (1.702 m)   Wt (!) 142.9 kg   SpO2 100%   BMI 49.34 kg/m   Physical Exam Constitutional:      Appearance: He is well-developed.  HENT:     Head: Normocephalic.     Nose: Nose normal.  Eyes:     Extraocular Movements: Extraocular movements intact.  Cardiovascular:     Rate and Rhythm: Normal rate.  Pulmonary:     Effort: Pulmonary effort is normal.  Musculoskeletal:     Comments: Moderate swelling to the right lower extremity.  Otherwise no abnormal warmth no cellulitis noted.  Pulses are intact 2+ dorsalis pedis.  Skin:    Coloration: Skin is not jaundiced.  Neurological:     Mental Status: He is alert. Mental status is at baseline.    ED Results / Procedures / Treatments   Labs (all labs ordered are listed, but only abnormal results are displayed) Labs Reviewed  CBC WITH DIFFERENTIAL/PLATELET - Abnormal; Notable for the following components:      Result Value   RBC 4.19 (*)    All other components within normal limits  COMPREHENSIVE METABOLIC PANEL - Abnormal; Notable for the following components:   Glucose, Bld 112 (*)    All other components within normal limits  BRAIN NATRIURETIC PEPTIDE    EKG EKG Interpretation  Date/Time:  Wednesday September 13 2021 07:20:54 EST Ventricular Rate:  63 PR Interval:  157 QRS Duration: 98 QT Interval:  414 QTC Calculation: 424 R Axis:   6 Text Interpretation: Sinus rhythm Low voltage, precordial leads Confirmed by [id number] ([id number]) on 09/13/2021 7:24:53 AM  Radiology DG Chest 2  View  Result Date: 09/13/2021 CLINICAL DATA:  65 year old male with history of shortness of breath. EXAM: CHEST - 2 VIEW COMPARISON:  No priors. FINDINGS: Lung volumes are normal to slightly low. Widespread areas of interstitial prominence are noted throughout the lungs bilaterally, along with diffuse peribronchial cuffing, most severe in the anterior aspects of the upper lobes of the lungs bilaterally. No confluent consolidative airspace disease. No pleural effusions. No pneumothorax. No evidence of pulmonary edema.  Heart size is normal. Upper mediastinal contours are within normal limits. IMPRESSION: 1. Findings could suggest severe bronchitis with developing multilobar bilateral bronchopneumonia, however, no prior studies are available for comparison. The possibility of chronic interstitial lung disease should also be considered. If there is clinical concern for interstitial lung disease, follow-up high-resolution chest CT would provide additional diagnostic information. Electronically Signed   By: Trudie Reed M.D.   On: 09/13/2021 07:16   CT Cervical Spine Wo Contrast  Result Date: 09/13/2021 CLINICAL DATA:  65 year old male with history of neck trauma. History of cervical fusion. EXAM: CT CERVICAL SPINE WITHOUT CONTRAST TECHNIQUE: Multidetector CT imaging of the cervical spine was performed without intravenous contrast. Multiplanar CT image reconstructions were also generated. COMPARISON:  No priors. FINDINGS: Alignment: Normal. Skull base and vertebrae: No acute fracture. No primary bone lesion or focal pathologic process. Soft tissues and spinal canal: No prevertebral fluid or swelling. No visible canal hematoma. Disc levels: Multilevel degenerative disc disease, most severe at C5-C6 and C7-T1. Evidence of prior cervical spinal fusion at C6-C7 which appears complete. No indwelling hardware is noted at this time. Multilevel facet arthropathy bilaterally (moderate in severity). Upper chest:  Unremarkable. Other: None. IMPRESSION: 1. No evidence of significant acute traumatic injury to the cervical spine. 2. Multilevel degenerative disc disease and cervical spondylosis, as above, with evidence of prior C6-C7 spinal fusion. Electronically Signed   By: Trudie Reed M.D.   On: 09/13/2021 07:15   US Venous Img Lower Unilateral Right  Result Date: 09/13/2021 CLINICAL DATA:  Acute right lower extremity swelling. EXAM: Right LOWER EXTREMITY VENOUS DOPPLER ULTRASOUND TECHNIQUE: Gray-scale sonography with compression, as well as color and duplex ultrasound, were performed to evaluate the deep venous system(s) from the level of the common femoral vein through the popliteal and proximal calf veins. COMPARISON:  None. FINDINGS: VENOUS Normal compressibility of the common femoral, superficial femoral, and popliteal veins, as well as the visualized calf veins. Visualized portions of profunda femoral vein and great saphenous vein unremarkable. No filling defects to suggest DVT on grayscale or color Doppler imaging. Doppler waveforms show normal direction of venous flow, normal respiratory plasticity and response to augmentation. Limited views of the contralateral common femoral vein are unremarkable. OTHER Probable Baker's cyst seen in right popliteal fossa. Limitations: none IMPRESSION: No evidence of deep venous thrombosis seen in right lower extremity. Electronically Signed   By: Lupita Raider M.D.   On: 09/13/2021 10:07    Procedures Procedures   Medications Ordered in ED Medications - No data to display  ED Course  I have reviewed the triage vital signs and the nursing notes.  Pertinent labs & imaging results that were available during my care of the patient were reviewed by me and considered in my medical decision making (see chart for details).    MDM Rules/Calculators/A&P                           Labs are unremarkable chemistry within normal limits white count normal hemoglobin  normal proBNP normal peer ultrasound shows no evidence of DVT.  Patient will be recommended follow-up in outpatient basis with primary care doctor in 1 week.  Recommend immediate return for chest pain difficulty breathing or any additional concerns.  Final Clinical Impression(s) / ED Diagnoses Final diagnoses:  Leg swelling  Neck pain    Rx / DC Orders ED Discharge Orders     None  Cheryll Cockayne, MD 09/13/21 1020

## 2021-09-13 NOTE — ED Triage Notes (Signed)
Pt with c/o bilateral leg swelling- thinks he has a blood clot "accoring to [employer]", pt also fell last week (hx of cervical fusion) and now c/o neck pain. Wants to be evaluated for this as well.

## 2021-09-14 ENCOUNTER — Encounter (HOSPITAL_COMMUNITY): Payer: Self-pay | Admitting: Emergency Medicine

## 2021-09-20 DIAGNOSIS — J449 Chronic obstructive pulmonary disease, unspecified: Secondary | ICD-10-CM | POA: Diagnosis not present

## 2021-09-20 DIAGNOSIS — Z86010 Personal history of colonic polyps: Secondary | ICD-10-CM | POA: Diagnosis not present

## 2021-09-20 DIAGNOSIS — I252 Old myocardial infarction: Secondary | ICD-10-CM | POA: Diagnosis not present

## 2021-09-20 DIAGNOSIS — H548 Legal blindness, as defined in USA: Secondary | ICD-10-CM | POA: Diagnosis not present

## 2021-09-20 DIAGNOSIS — Z720 Tobacco use: Secondary | ICD-10-CM | POA: Diagnosis not present

## 2021-09-20 DIAGNOSIS — E119 Type 2 diabetes mellitus without complications: Secondary | ICD-10-CM | POA: Diagnosis not present

## 2021-09-20 DIAGNOSIS — I200 Unstable angina: Secondary | ICD-10-CM | POA: Diagnosis not present

## 2021-09-20 DIAGNOSIS — Z1211 Encounter for screening for malignant neoplasm of colon: Secondary | ICD-10-CM | POA: Diagnosis not present

## 2021-10-04 ENCOUNTER — Other Ambulatory Visit: Payer: Self-pay | Admitting: *Deleted

## 2021-10-04 DIAGNOSIS — Z1211 Encounter for screening for malignant neoplasm of colon: Secondary | ICD-10-CM

## 2021-10-11 ENCOUNTER — Ambulatory Visit: Payer: Medicare HMO | Admitting: Surgery

## 2021-10-19 ENCOUNTER — Ambulatory Visit: Payer: No Typology Code available for payment source | Admitting: Surgery

## 2021-10-24 ENCOUNTER — Ambulatory Visit (INDEPENDENT_AMBULATORY_CARE_PROVIDER_SITE_OTHER): Payer: No Typology Code available for payment source | Admitting: Surgery

## 2021-10-24 ENCOUNTER — Other Ambulatory Visit: Payer: Self-pay

## 2021-10-24 ENCOUNTER — Encounter: Payer: Self-pay | Admitting: Surgery

## 2021-10-24 VITALS — BP 169/76 | HR 70 | Temp 98.6°F | Resp 18 | Ht 68.0 in | Wt 319.0 lb

## 2021-10-24 DIAGNOSIS — Z86010 Personal history of colonic polyps: Secondary | ICD-10-CM

## 2021-10-24 MED ORDER — SUTAB 1479-225-188 MG PO TABS: ORAL_TABLET | ORAL | 0 refills | Status: DC

## 2021-10-24 NOTE — Progress Notes (Signed)
[facility] History and Physical  Reason for Referral: Colonoscopy Referring Physician: Launa Grill, PA-C  Chief Complaint   New Patient (Initial Visit)     Bryan Blair is a 66 y.o. male.  HPI: He presents to the clinic today to establish care, as he is overdue for colonoscopy.  He states his last colonoscopy was at least 5 years ago, and he was noted to have polyps at that time.  He has had numerous colonoscopies which first started in his early 36s.  He states that he underwent his first colonoscopy, and subsequently required colonoscopies every 1 to 3 months which were then spaced out to every 6 months, and finally to annually and then every 5 years.  He denies any history of colon cancers or high-grade lesions that he knows of.  Upon review of his care everywhere, he has pathology from 2017 that demonstrated colon polyps that were tubular adenomas and serrated polyps with features of sessile serrated adenoma, no high-grade dysplasia or carcinoma in situ.  He underwent his initial colonoscopy at the same time as an EGD, as he was having significant GI issues.  He has a great uncle who was diagnosed with colon cancer.  He has a surgical history significant for 2 previous hiatal hernia repairs and ventral hernia repair.  He also has a history of 2 MIs, requiring stent placement.  His medical history is also significant for anxiety, COPD, hypertension, hyperlipidemia, and diabetes.  He is currently on 81 mg aspirin daily.  He smokes 1 pack/day, denies alcohol use.   Past Medical History:  Diagnosis Date   Anxiety    BPH (benign prostatic hyperplasia)    COPD (chronic obstructive pulmonary disease) ([place])    Coronary artery disease    Angioplasty LAD 1995, angioplasty circumflex 1996, stent intervention circumflex 2000   Essential hypertension    Hyperlipidemia    Legally blind    Neuropathy    Sleep apnea    STEMI (ST elevation myocardial infarction) ([place]) 2000    Type 2 diabetes mellitus ([place])     Past Surgical History:  Procedure Laterality Date   BACK SURGERY     HAND SURGERY     HERNIA REPAIR     JOINT REPLACEMENT     TONSILLECTOMY      Family History  Problem Relation Age of Onset   Heart disease Mother    Heart disease Father    Stroke Father     Social History   Tobacco Use   Smoking status: Every Day    Types: Cigarettes   Smokeless tobacco: Former  Scientific laboratory technician Use: Former  Substance Use Topics   Alcohol use: No   Drug use: No    Medications: I have reviewed the patient's current medications. Allergies as of 10/24/2021       Reactions   Iodine Anaphylaxis   Shellfish Allergy Anaphylaxis   Sodium Hyaluronate (avian) Swelling   Prochlorperazine Maleate Other (See Comments)   Makes hyperactive   Venomil Honey Bee Venom [honey Bee Venom] Other (See Comments)   Causes blood poisoning         Medication List        Accurate as of October 24, 2021  2:36 PM. If you have any questions, ask your nurse or doctor.          STOP taking these medications    ALPRAZolam 0.5 MG tablet Commonly known as: XANAX Stopped by: Rusty Aus, DO  TAKE these medications    albuterol 108 (90 Base) MCG/ACT inhaler Commonly known as: VENTOLIN HFA albuterol sulfate HFA 90 mcg/actuation aerosol inhaler   aspirin 81 MG tablet Take 81 mg by mouth 2 (two) times daily.   famotidine 20 MG tablet Commonly known as: PEPCID Take 20 mg by mouth daily as needed.   glipiZIDE 10 MG tablet Commonly known as: GLUCOTROL Take 10 mg by mouth 2 (two) times daily before a meal.   guaiFENesin 600 MG 12 hr tablet Commonly known as: MUCINEX Take 600 mg by mouth 2 (two) times daily.   ibuprofen 200 MG tablet Commonly known as: ADVIL Take 200 mg by mouth every 8 (eight) hours as needed.   isosorbide mononitrate 30 MG 24 hr tablet Commonly known as: IMDUR Take 30 mg by mouth daily.   losartan 25 MG  tablet Commonly known as: COZAAR Take 25 mg by mouth daily.   multivitamin with minerals Tabs tablet Take 1 tablet by mouth daily.   nitroGLYCERIN 0.4 MG SL tablet Commonly known as: NITROSTAT Place 0.4 mg under the tongue every 5 (five) minutes as needed for chest pain.   pioglitazone 45 MG tablet Commonly known as: ACTOS Take 45 mg by mouth daily.   rosuvastatin 20 MG tablet Commonly known as: CRESTOR Take 10 mg by mouth daily.   Sutab [id number] MG Tabs Generic drug: Sodium Sulfate-Mag Sulfate-KCl Take as directed Started by: Rusty Aus, DO   Sutab [phone] MG Tabs Generic drug: Sodium Sulfate-Mag Sulfate-KCl Take as Directed Started by: Fayth Trefry A Dessie Delcarlo, DO   tamsulosin 0.4 MG Caps capsule Commonly known as: FLOMAX Take 0.4 mg by mouth daily.   Trelegy Ellipta 100-62.5-25 MCG/ACT Aepb Generic drug: Fluticasone-Umeclidin-Vilant Inhale into the lungs daily.        ROS:  Constitutional: negative for chills, fatigue, and fevers Eyes: positive for legally blind, negative for visual disturbance Ears, nose, mouth, throat, and face: positive for sinus problems, negative for sore throat and ear infection Respiratory: positive for cough, wheezing, and shortness of breath Cardiovascular: negative for chest pain and palpitations Gastrointestinal: negative for abdominal pain, nausea, and reflux symptoms Genitourinary:negative for dysuria, frequency, and hematuria Integument/breast: negative for dryness and boils Hematologic/lymphatic: negative for lymphadenopathy Musculoskeletal:positive for back pain and neck pain Neurological: positive for neuropathy, negative for dizziness and tremors Endocrine: positive for diabetic symptoms including polydipsia  Blood pressure (!) 169/76, pulse 70, temperature 98.6 F (37 C), temperature source Other (Comment), resp. rate 18, height 5\' 8"  (1.727 m), weight (!) 319 lb (144.7 kg), SpO2 92 %. Physical  Exam Vitals reviewed.  Constitutional:      Appearance: Normal appearance.  HENT:     Head: Normocephalic and atraumatic.  Eyes:     Extraocular Movements: Extraocular movements intact.     Pupils: Pupils are equal, round, and reactive to light.  Cardiovascular:     Rate and Rhythm: Normal rate and regular rhythm.  Pulmonary:     Effort: Pulmonary effort is normal.     Breath sounds: Normal breath sounds.  Abdominal:     Comments: Soft, nondistended, no percussion tenderness, no tenderness to palpation, no rigidity, guarding, rebound tenderness.  Multiple cicatrix noted  Musculoskeletal:        General: Normal range of motion.     Cervical back: Normal range of motion.  Skin:    General: Skin is warm and dry.  Neurological:     General: No focal deficit present.     Mental Status: He  is alert and oriented to person, place, and time.  Psychiatric:        Mood and Affect: Mood normal.        Behavior: Behavior normal.    Results: No results found for this or any previous visit (from the past 48 hour(s)).  No results found.   Assessment & Plan:  Bryan Blair is a 66 y.o. male who presents to schedule colonoscopy with history of colon polyps.  -Will call patient with date for colonoscopy, as unable to schedule at this time  -Prescription and instructions provided for Sutab prep -Discussed risk and benefits of colonoscopy, including but not limited to bleeding, infection, perforation requiring surgery, and missed lesions.  Patient is agreeable to the procedure. -Patient did state that he usually requires a significant amount of medications for him to be put under for procedures, as he had a significant history of opioid abuse  All questions were answered to the satisfaction of the patient.   Graciella Freer, DO [facility] [address] [place], [place] [address] [phone] (office)

## 2021-11-07 ENCOUNTER — Encounter: Payer: Self-pay | Admitting: Internal Medicine

## 2021-11-07 ENCOUNTER — Other Ambulatory Visit: Payer: Self-pay

## 2021-11-07 ENCOUNTER — Ambulatory Visit (INDEPENDENT_AMBULATORY_CARE_PROVIDER_SITE_OTHER): Payer: No Typology Code available for payment source | Admitting: Internal Medicine

## 2021-11-07 VITALS — BP 144/92 | HR 74 | Temp 98.6°F | Ht 67.0 in | Wt 329.0 lb

## 2021-11-07 DIAGNOSIS — J449 Chronic obstructive pulmonary disease, unspecified: Secondary | ICD-10-CM | POA: Diagnosis not present

## 2021-11-07 DIAGNOSIS — F17210 Nicotine dependence, cigarettes, uncomplicated: Secondary | ICD-10-CM | POA: Diagnosis not present

## 2021-11-07 DIAGNOSIS — R0609 Other forms of dyspnea: Secondary | ICD-10-CM

## 2021-11-07 MED ORDER — AZITHROMYCIN 250 MG PO TABS: ORAL_TABLET | ORAL | 0 refills | Status: DC

## 2021-11-07 MED ORDER — BREZTRI AEROSPHERE 160-9-4.8 MCG/ACT IN AERO: 2.0000 | INHALATION_SPRAY | Freq: Two times a day (BID) | RESPIRATORY_TRACT | 0 refills | Status: DC

## 2021-11-07 NOTE — H&P (Signed)
[facility] History and Physical  Reason for Referral: Colonoscopy Referring Physician: Launa Grill, PA-C  Chief Complaint   New Patient (Initial Visit)     Bryan Blair is a 66 y.o. male.  HPI: He presents to the clinic today to establish care, as he is overdue for colonoscopy.  He states his last colonoscopy was at least 5 years ago, and he was noted to have polyps at that time.  He has had numerous colonoscopies which first started in his early 36s.  He states that he underwent his first colonoscopy, and subsequently required colonoscopies every 1 to 3 months which were then spaced out to every 6 months, and finally to annually and then every 5 years.  He denies any history of colon cancers or high-grade lesions that he knows of.  Upon review of his care everywhere, he has pathology from 2017 that demonstrated colon polyps that were tubular adenomas and serrated polyps with features of sessile serrated adenoma, no high-grade dysplasia or carcinoma in situ.  He underwent his initial colonoscopy at the same time as an EGD, as he was having significant GI issues.  He has a great uncle who was diagnosed with colon cancer.  He has a surgical history significant for 2 previous hiatal hernia repairs and ventral hernia repair.  He also has a history of 2 MIs, requiring stent placement.  His medical history is also significant for anxiety, COPD, hypertension, hyperlipidemia, and diabetes.  He is currently on 81 mg aspirin daily.  He smokes 1 pack/day, denies alcohol use.   Past Medical History:  Diagnosis Date   Anxiety    BPH (benign prostatic hyperplasia)    COPD (chronic obstructive pulmonary disease) ([place])    Coronary artery disease    Angioplasty LAD 1995, angioplasty circumflex 1996, stent intervention circumflex 2000   Essential hypertension    Hyperlipidemia    Legally blind    Neuropathy    Sleep apnea    STEMI (ST elevation myocardial infarction) ([place]) 2000    Type 2 diabetes mellitus ([place])     Past Surgical History:  Procedure Laterality Date   BACK SURGERY     HAND SURGERY     HERNIA REPAIR     JOINT REPLACEMENT     TONSILLECTOMY      Family History  Problem Relation Age of Onset   Heart disease Mother    Heart disease Father    Stroke Father     Social History   Tobacco Use   Smoking status: Every Day    Types: Cigarettes   Smokeless tobacco: Former  Scientific laboratory technician Use: Former  Substance Use Topics   Alcohol use: No   Drug use: No    Medications: I have reviewed the patient's current medications. Allergies as of 10/24/2021       Reactions   Iodine Anaphylaxis   Shellfish Allergy Anaphylaxis   Sodium Hyaluronate (avian) Swelling   Prochlorperazine Maleate Other (See Comments)   Makes hyperactive   Venomil Honey Bee Venom [honey Bee Venom] Other (See Comments)   Causes blood poisoning         Medication List        Accurate as of October 24, 2021  2:36 PM. If you have any questions, ask your nurse or doctor.          STOP taking these medications    ALPRAZolam 0.5 MG tablet Commonly known as: XANAX Stopped by: Rusty Aus, DO  TAKE these medications    albuterol 108 (90 Base) MCG/ACT inhaler Commonly known as: VENTOLIN HFA albuterol sulfate HFA 90 mcg/actuation aerosol inhaler   aspirin 81 MG tablet Take 81 mg by mouth 2 (two) times daily.   famotidine 20 MG tablet Commonly known as: PEPCID Take 20 mg by mouth daily as needed.   glipiZIDE 10 MG tablet Commonly known as: GLUCOTROL Take 10 mg by mouth 2 (two) times daily before a meal.   guaiFENesin 600 MG 12 hr tablet Commonly known as: MUCINEX Take 600 mg by mouth 2 (two) times daily.   ibuprofen 200 MG tablet Commonly known as: ADVIL Take 200 mg by mouth every 8 (eight) hours as needed.   isosorbide mononitrate 30 MG 24 hr tablet Commonly known as: IMDUR Take 30 mg by mouth daily.   losartan 25 MG  tablet Commonly known as: COZAAR Take 25 mg by mouth daily.   multivitamin with minerals Tabs tablet Take 1 tablet by mouth daily.   nitroGLYCERIN 0.4 MG SL tablet Commonly known as: NITROSTAT Place 0.4 mg under the tongue every 5 (five) minutes as needed for chest pain.   pioglitazone 45 MG tablet Commonly known as: ACTOS Take 45 mg by mouth daily.   rosuvastatin 20 MG tablet Commonly known as: CRESTOR Take 10 mg by mouth daily.   Sutab [id number] MG Tabs Generic drug: Sodium Sulfate-Mag Sulfate-KCl Take as directed Started by: Rusty Aus, DO   Sutab [id number] MG Tabs Generic drug: Sodium Sulfate-Mag Sulfate-KCl Take as Directed Started by: CATHERINE A PAPPAYLIOU, DO   tamsulosin 0.4 MG Caps capsule Commonly known as: FLOMAX Take 0.4 mg by mouth daily.   Trelegy Ellipta 100-62.5-25 MCG/ACT Aepb Generic drug: Fluticasone-Umeclidin-Vilant Inhale into the lungs daily.        ROS:  Constitutional: negative for chills, fatigue, and fevers Eyes: positive for legally blind, negative for visual disturbance Ears, nose, mouth, throat, and face: positive for sinus problems, negative for sore throat and ear infection Respiratory: positive for cough, wheezing, and shortness of breath Cardiovascular: negative for chest pain and palpitations Gastrointestinal: negative for abdominal pain, nausea, and reflux symptoms Genitourinary:negative for dysuria, frequency, and hematuria Integument/breast: negative for dryness and boils Hematologic/lymphatic: negative for lymphadenopathy Musculoskeletal:positive for back pain and neck pain Neurological: positive for neuropathy, negative for dizziness and tremors Endocrine: positive for diabetic symptoms including polydipsia  Blood pressure (!) 169/76, pulse 70, temperature 98.6 F (37 C), temperature source Other (Comment), resp. rate 18, height 5\' 8"  (1.727 m), weight (!) 319 lb (144.7 kg), SpO2 92 %. Physical  Exam Vitals reviewed.  Constitutional:      Appearance: Normal appearance.  HENT:     Head: Normocephalic and atraumatic.  Eyes:     Extraocular Movements: Extraocular movements intact.     Pupils: Pupils are equal, round, and reactive to light.  Cardiovascular:     Rate and Rhythm: Normal rate and regular rhythm.  Pulmonary:     Effort: Pulmonary effort is normal.     Breath sounds: Normal breath sounds.  Abdominal:     Comments: Soft, nondistended, no percussion tenderness, no tenderness to palpation, no rigidity, guarding, rebound tenderness.  Multiple cicatrix noted  Musculoskeletal:        General: Normal range of motion.     Cervical back: Normal range of motion.  Skin:    General: Skin is warm and dry.  Neurological:     General: No focal deficit present.     Mental Status: He  is alert and oriented to person, place, and time.  Psychiatric:        Mood and Affect: Mood normal.        Behavior: Behavior normal.    Results: No results found for this or any previous visit (from the past 48 hour(s)).  No results found.   Assessment & Plan:  Bryan Blair is a 66 y.o. male who presents to schedule colonoscopy with history of colon polyps.  -Will call patient with date for colonoscopy, as unable to schedule at this time  -Prescription and instructions provided for Sutab prep -Discussed risk and benefits of colonoscopy, including but not limited to bleeding, infection, perforation requiring surgery, and missed lesions.  Patient is agreeable to the procedure. -Patient did state that he usually requires a significant amount of medications for him to be put under for procedures, as he had a significant history of opioid abuse  All questions were answered to the satisfaction of the patient.  Aviva Signs, MD

## 2021-11-07 NOTE — Patient Instructions (Addendum)
Plan A = Automatic = Always=    Trelegy one click each am or breztri 2 every 12 hours   Work on inhaler technique:  relax and gently blow all the way out then take a nice smooth full deep breath back in, triggering the inhaler at same time you start breathing in.  Hold for up to 5 seconds if you can. Blow out thru nose. Rinse and gargle with water when done.  If mouth or throat bother you at all,  try brushing teeth/gums/tongue with arm and hammer toothpaste/ make a slurry and gargle and spit out.   Zpak   Plan B = Backup (to supplement plan A, not to replace it) Only use your albuterol inhaler as a rescue medication to be used if you can't catch your breath by resting or doing a relaxed purse lip breathing pattern.  - The less you use it, the better it will work when you need it. - Ok to use the inhaler up to 2 puffs  every 4 hours if you must but call for appointment if use goes up over your usual need - Don't leave home without it !!  (think of it like the spare tire for your car)    Ok to try albuterol 15 min before an activity (on alternating days)  that you know would usually make you short of breath and see if it makes any difference and if makes none then don't take albuterol after activity unless you can't catch your breath as this means it's the resting that helps, not the albuterol.   For cough / congestion > mucinex 1200 mg twice daily   The key is to stop smoking completely before smoking completely stops you!  Actos may be contributing to fluid retention and breathing problems   Please schedule a follow up visit in 3 months but call sooner if needed - PFT's on return

## 2021-11-07 NOTE — Progress Notes (Signed)
Bryan Blair, male    DOB: Oct 12, 1955,   MRN: [id number]   Brief patient profile:  61   yowm  active smoker  with nl pfts in 2015 (at wt 320) when by Bryan Blair  referred to pulmonary clinic in [place]  11/07/2021 by  Bryan Blair for doe      History of Present Illness  11/07/2021  Pulmonary/ 1st office eval/ Bryan Blair / [facility]  Chief Complaint  Patient presents with   Consult    Patient has seen Bryan Blair pulmonary with [facility]. Here to establish care in [place], recently moved.  Ref for COPD.   Dyspnea:  mb 100 ft flat has to stop when gets to mailbox or back to house  Can still do [employer]  Cough: smoker's cough w/in 30 min > mucoid  now thruout day due to "crud" 2.5 weeks - taking subtherapeutic mucinex  Sleep: in recline  around 45 degrees  x  40s / can't wear any form of cpap and resltess/ insomnia without excess hypersomnolence  SABA use: once a month   No obvious day to day or daytime variability or assoc  mucus plugs or hemoptysis or cp or chest tightness, subjective wheeze or overt sinus or hb symptoms.    Also denies any obvious fluctuation of symptoms with weather or environmental changes or other aggravating or alleviating factors except as outlined above   No unusual exposure hx or h/o childhood pna/ asthma or knowledge of premature birth.  Current Allergies, Complete Past Medical History, Past Surgical History, Family History, and Social History were reviewed in [employer] record.  ROS  The following are not active complaints unless bolded Hoarseness, sore throat, dysphagia, dental problems, itching, sneezing,  nasal congestion or discharge of excess mucus or purulent secretions, ear ache,   fever, chills, sweats, unintended wt loss or wt gain, classically pleuritic or exertional cp,  orthopnea pnd or arm/hand swelling  or leg swelling, presyncope, palpitations, abdominal pain, anorexia, nausea, vomiting, diarrhea  or change in bowel habits  or change in bladder habits, change in stools or change in urine, dysuria, hematuria,  rash, arthralgias, visual complaints, headache, numbness, weakness or ataxia or problems with walking or coordination,  change in mood or  memory.           Past Medical History:  Diagnosis Date   Anxiety    BPH (benign prostatic hyperplasia)    COPD (chronic obstructive pulmonary disease) ([place])    Coronary artery disease    Angioplasty LAD 1995, angioplasty circumflex 1996, stent intervention circumflex 2000   Essential hypertension    Hyperlipidemia    Legally blind    Neuropathy    Sleep apnea    STEMI (ST elevation myocardial infarction) ([place]) 2000   Type 2 diabetes mellitus ([place])     Outpatient Medications Prior to Visit  Medication Sig Dispense Refill   albuterol (VENTOLIN HFA) 108 (90 Base) MCG/ACT inhaler albuterol sulfate HFA 90 mcg/actuation aerosol inhaler     aspirin 81 MG tablet Take 81 mg by mouth 2 (two) times daily.     famotidine (PEPCID) 20 MG tablet Take 20 mg by mouth daily as needed.     Fluticasone-Umeclidin-Vilant (TRELEGY ELLIPTA) 100-62.5-25 MCG/INH AEPB Inhale into the lungs daily.     glipiZIDE (GLUCOTROL) 10 MG tablet Take 10 mg by mouth 2 (two) times daily before a meal.     guaiFENesin (MUCINEX) 600 MG 12 hr tablet Take 600 mg by mouth 2 (two) times  daily.     ibuprofen (ADVIL) 200 MG tablet Take 200 mg by mouth every 8 (eight) hours as needed.     isosorbide mononitrate (IMDUR) 30 MG 24 hr tablet Take 30 mg by mouth daily.     losartan (COZAAR) 25 MG tablet Take 25 mg by mouth daily.     Multiple Vitamin (MULTIVITAMIN WITH MINERALS) TABS tablet Take 1 tablet by mouth daily.     nitroGLYCERIN (NITROSTAT) 0.4 MG SL tablet Place 0.4 mg under the tongue every 5 (five) minutes as needed for chest pain.     pioglitazone (ACTOS) 45 MG tablet Take 45 mg by mouth daily.     rosuvastatin (CRESTOR) 20 MG tablet Take 10 mg by mouth daily.     Sodium Sulfate-Mag Sulfate-KCl  (SUTAB) [phone] MG TABS Take as Directed 24 tablet 0   tamsulosin (FLOMAX) 0.4 MG CAPS capsule Take 0.4 mg by mouth daily.     Sodium Sulfate-Mag Sulfate-KCl (SUTAB) [phone] MG TABS Take as directed 12 tablet 0   No facility-administered medications prior to visit.     Objective:     BP (!) 144/92 (BP Location: Left Arm, Patient Position: Sitting)    Pulse 74    Temp 98.6 F (37 C) (Temporal)    Ht 5\' 7"  (1.702 m)    Wt (!) 329 lb (149.2 kg)    SpO2 97% Comment: ra   BMI 51.53 kg/m   SpO2: 97 % (ra)  Obese wm nad / smoker's rattle     HEENT : pt wearing mask not removed for exam due to covid -19 concerns.    NECK :  without JVD/Nodes/TM/ nl carotid upstrokes bilaterally   LUNGS: no acc muscle use,  Nl contour chest  with distant bs  bilaterally without cough on insp or exp maneuvers   CV:  RRR  no s3 or murmur or increase in P2, and 1+ pitting both LE  ABD:  quite obese but soft and nontender with nl inspiratory excursion in the supine position. No bruits or organomegaly appreciated, bowel sounds nl  MS:  Nl gait/ ext warm without deformities, calf tenderness, cyanosis or clubbing No obvious joint restrictions   SKIN: warm and dry without lesions    NEURO:  alert, approp, nl sensorium with  no motor or cerebellar deficits apparent.     I personally reviewed images and agree with radiology impression as follows:  CXR:   PA and lateral  09/13/21  1. Findings could suggest severe bronchitis with developing multilobar bilateral bronchopneumonia, however, no prior studies are available for comparison. The possibility of chronic interstitial lung disease should also be considered.      Assessment   Asthmatic bronchitis , chronic (HCC) Active smoker Onset around 20-30s - PFTs 2015 by Bryan Blair no obstruction but pos response to trelegy  11/07/2021  After extensive coaching inhaler device,  effectiveness =    75% try breztri x one week - 11/07/2021   Walked RA  x   3  lap(s) =  approx 450  ft  @ mod fast pace, stopped due to end of study, min sob  with lowest 02 sats 92%   Symptoms are markedly disproportionate to objective findings and not clear to what extent this is actually a pulmonary  problem but pt does appear to have difficult to sort out respiratory symptoms of unknown origin for which  DDX  = almost all start with A and  include Adherence, Ace Inhibitors, Acid Reflux, Active Sinus  Disease, Alpha 1 Antitripsin deficiency, Anxiety masquerading as Airways dz,  ABPA,  Allergy(esp in young), Aspiration (esp in elderly), Adverse effects of meds,  Active smoking or Vaping, A bunch of PE's/clot burden (a few small clots can't cause this syndrome unless there is already severe underlying pulm or vascular dz with poor reserve),  Anemia or thyroid disorder, plus two Bs  = Bronchiectasis and Beta blocker use..and one C= CHF     Adherence is always the initial "prime suspect" and is a multilayered concern that requires a "trust but verify" approach in every patient - starting with knowing how to use medications, especially inhalers, correctly, keeping up with refills and understanding the fundamental difference between maintenance and prns vs those medications only taken for a very short course and then stopped and not refilled.  - see hfa teaching - return with all meds in hand using a trust but verify approach to confirm accurate Medication  Reconciliation The principal here is that until we are certain that the  patients are doing what we've asked, it makes no sense to ask them to do more.  - may need pt assistance to continue breztri vs symbicort with the latter maybe a better option is absence of copd   Active smoking > see sep a/p  ? Anxiety /depression/ deconditioning/wt gain > usually at the bottom of this list of usual suspects but may interfere with efforts to stop smoking and may also interfere with adherence and also interpretation of response or lack  thereof to symptom management which can be quite subjective.   ? Active sinusitis/ rhintis/ bronchitis > zpak   ? Adverse effects of dpi > try off trelegy and on breztri x one week   ? chf > note edema on exam but BNP nl > consider may be side effect of actos but defer to PCP and cards to sort out.   Morbid obesity due to excess calories (HCC) Body mass index is 51.53 kg/m.   No results found for: TSH    Contributing to doe and risk of GERD >>>   reviewed the need and the process to achieve and maintain neg calorie balance > defer f/u primary care including intermittently monitoring thyroid status     Cigarette smoker  Counseled re importance of smoking cessation but did not meet time criteria for separate billing     Each maintenance medication was reviewed in detail including emphasizing most importantly the difference between maintenance and prns and under what circumstances the prns are to be triggered using an action plan format where appropriate.  Total time for H and P, chart review, counseling, reviewing hfa/dpi device(s) , directly observing portions of ambulatory 02 saturation study/ and generating customized AVS unique to this office visit / same day charting = 48 min                   .   Christinia Gully, MD 11/07/2021

## 2021-11-07 NOTE — Addendum Note (Signed)
Addended by: Carleene Mains D on: 11/07/2021 04:09 PM   Modules accepted: Orders

## 2021-11-07 NOTE — Assessment & Plan Note (Signed)
Body mass index is 51.53 kg/m.   No results found for: TSH    Contributing to doe and risk of GERD >>>   reviewed the need and the process to achieve and maintain neg calorie balance > defer f/u primary care including intermittently monitoring thyroid status

## 2021-11-07 NOTE — Assessment & Plan Note (Addendum)
Counseled re importance of smoking cessation but did not meet time criteria for separate billing     Each maintenance medication was reviewed in detail including emphasizing most importantly the difference between maintenance and prns and under what circumstances the prns are to be triggered using an action plan format where appropriate.  Total time for H and P, chart review, counseling, reviewing hfa/dpi device(s) , directly observing portions of ambulatory 02 saturation study/ and generating customized AVS unique to this initial office visit / same day charting = 48 min                   .

## 2021-11-07 NOTE — Assessment & Plan Note (Addendum)
Active smoker Onset around 20-30s - PFTs 2015 by Snapper no obstruction but pos response to trelegy  11/07/2021  After extensive coaching inhaler device,  effectiveness =    75% try breztri x one week - 11/07/2021   Walked RA  x  3  lap(s) =  approx 450  ft  @ mod fast pace, stopped due to end of study, min sob  with lowest 02 sats 92%   Symptoms are markedly disproportionate to objective findings and not clear to what extent this is actually a pulmonary  problem but pt does appear to have difficult to sort out respiratory symptoms of unknown origin for which  DDX  = almost all start with A and  include Adherence, Ace Inhibitors, Acid Reflux, Active Sinus Disease, Alpha 1 Antitripsin deficiency, Anxiety masquerading as Airways dz,  ABPA,  Allergy(esp in young), Aspiration (esp in elderly), Adverse effects of meds,  Active smoking or Vaping, A bunch of PE's/clot burden (a few small clots can't cause this syndrome unless there is already severe underlying pulm or vascular dz with poor reserve),  Anemia or thyroid disorder, plus two Bs  = Bronchiectasis and Beta blocker use..and one C= CHF     Adherence is always the initial "prime suspect" and is a multilayered concern that requires a "trust but verify" approach in every patient - starting with knowing how to use medications, especially inhalers, correctly, keeping up with refills and understanding the fundamental difference between maintenance and prns vs those medications only taken for a very short course and then stopped and not refilled.  - see hfa teaching - return with all meds in hand using a trust but verify approach to confirm accurate Medication  Reconciliation The principal here is that until we are certain that the  patients are doing what we've asked, it makes no sense to ask them to do more.  - may need pt assistance to continue breztri vs symbicort with the latter maybe a better option is absence of copd   Active smoking > see sep a/p  ?  Anxiety /depression/ deconditioning/wt gain > usually at the bottom of this list of usual suspects but may interfere with efforts to stop smoking and may also interfere with adherence and also interpretation of response or lack thereof to symptom management which can be quite subjective.   ? Active sinusitis/ rhintis/ bronchitis > zpak   ? Adverse effects of dpi > try off trelegy and on breztri x one week   ? chf > note edema on exam but BNP nl > consider may be side effect of actos but defer to PCP and cards to sort out.

## 2021-11-14 ENCOUNTER — Telehealth: Payer: Self-pay | Admitting: Internal Medicine

## 2021-11-14 ENCOUNTER — Encounter (HOSPITAL_COMMUNITY): Payer: No Typology Code available for payment source

## 2021-11-14 MED ORDER — AMOXICILLIN-POT CLAVULANATE 875-125 MG PO TABS: 1.0000 | ORAL_TABLET | Freq: Two times a day (BID) | ORAL | 0 refills | Status: AC

## 2021-11-14 NOTE — Telephone Encounter (Signed)
Primary Pulmonologist: Dr.Wert  Last office visit and with whom: 11/07/2021 Dr. Sherene Sires  What do we see them for (pulmonary problems): DOE asthmatic bronchitis  Last OV assessment/plan: see below   Was appointment offered to patient (explain)?  No none available    Reason for call: Patient called stating he has the same congestions he had at last OV on Jan 31. He states it has improved a lot since taking the ZPAK but is still coughing up green/brown mucus. He has some congestion and wheezing but no fevers.   He would like another ZPAK   Dr. Sherene Sires please advise.    Allergies  Allergen Reactions   Iodine Anaphylaxis   Shellfish Allergy Anaphylaxis   Sodium Hyaluronate ([place]) Swelling   Prochlorperazine Maleate Other (See Comments)    Makes hyperactive   Venomil Honey Bee Venom [Honey Bee Venom] Other (See Comments)    Causes blood poisoning     Immunization History  Administered Date(s) Administered   Fluad Quad(high Dose 65+) 07/07/2021   Influenza, Seasonal, Injecte, Preservative Fre 08/28/2013, 07/29/2014   Influenza,inj,Quad PF,6+ Mos 07/22/2015, 07/10/2016, 10/11/2017, 07/09/2018   Influenza-Unspecified 08/28/2013, 07/29/2014, 07/22/2015, 07/10/2016, 10/11/2017, 07/09/2018   Pneumococcal Conjugate-13 08/28/2013   Pneumococcal Polysaccharide-23 09/10/2014   Tdap 07/22/2015     Assessment    Asthmatic bronchitis , chronic (HCC) Active smoker Onset around 20-30s - PFTs 2015 by Snapper no obstruction but pos response to trelegy  11/07/2021  After extensive coaching inhaler device,  effectiveness =    75% try breztri x one week - 11/07/2021   Walked RA  x  3  lap(s) =  approx 450  ft  @ mod fast pace, stopped due to end of study, min sob  with lowest 02 sats 92%    Symptoms are markedly disproportionate to objective findings and not clear to what extent this is actually a pulmonary  problem but pt does appear to have difficult to sort out respiratory symptoms of unknown origin  for which  DDX  = almost all start with A and  include Adherence, Ace Inhibitors, Acid Reflux, Active Sinus Disease, Alpha 1 Antitripsin deficiency, Anxiety masquerading as Airways dz,  ABPA,  Allergy(esp in young), Aspiration (esp in elderly), Adverse effects of meds,  Active smoking or Vaping, A bunch of PE's/clot burden (a few small clots can't cause this syndrome unless there is already severe underlying pulm or vascular dz with poor reserve),  Anemia or thyroid disorder, plus two Bs  = Bronchiectasis and Beta blocker use..and one C= CHF      Adherence is always the initial "prime suspect" and is a multilayered concern that requires a "trust but verify" approach in every patient - starting with knowing how to use medications, especially inhalers, correctly, keeping up with refills and understanding the fundamental difference between maintenance and prns vs those medications only taken for a very short course and then stopped and not refilled.  - see hfa teaching - return with all meds in hand using a trust but verify approach to confirm accurate Medication  Reconciliation The principal here is that until we are certain that the  patients are doing what we've asked, it makes no sense to ask them to do more.  - may need pt assistance to continue breztri vs symbicort with the latter maybe a better option is absence of copd    Active smoking > see sep a/p   ? Anxiety /depression/ deconditioning/wt gain > usually at the bottom of this list  of usual suspects but may interfere with efforts to stop smoking and may also interfere with adherence and also interpretation of response or lack thereof to symptom management which can be quite subjective.    ? Active sinusitis/ rhintis/ bronchitis > zpak    ? Adverse effects of dpi > try off trelegy and on breztri x one week    ? chf > note edema on exam but BNP nl > consider may be side effect of actos but defer to PCP and cards to sort out.    Morbid obesity  due to excess calories (HCC) Body mass index is 51.53 kg/m.   No results found for: TSH      Contributing to doe and risk of GERD >>>   reviewed the need and the process to achieve and maintain neg calorie balance > defer f/u primary care including intermittently monitoring thyroid status      Cigarette smoker  Counseled re importance of smoking cessation but did not meet time criteria for separate billing       Each maintenance medication was reviewed in detail including emphasizing most importantly the difference between maintenance and prns and under what circumstances the prns are to be triggered using an action plan format where appropriate.   Total time for H and P, chart review, counseling, reviewing hfa/dpi device(s) , directly observing portions of ambulatory 02 saturation study/ and generating customized AVS unique to this office visit / same day charting = 48 min

## 2021-11-14 NOTE — Telephone Encounter (Signed)
Called and spoke to patient he voiced understanding on how to take augmentin. Verified pharmacy with patient. Nothing further needed

## 2021-11-14 NOTE — Telephone Encounter (Signed)
Augmentin 875 mg take one pill twice daily  X 10 days - take at breakfast and supper with large glass of water.  It would help reduce the usual side effects (diarrhea and yeast infections) if you ate cultured yogurt at lunch.  

## 2021-11-20 ENCOUNTER — Telehealth: Payer: Self-pay | Admitting: *Deleted

## 2021-11-20 NOTE — Telephone Encounter (Signed)
Received call from patient.   Reports that he needed to change date of colonoscopy from 11/21/2021.   States that he will also need to change bowel prep as he cannot afford it. Unfortunately, patient states that he is unable to afford SuTabs and requested different prep.  Please advise.

## 2021-11-21 ENCOUNTER — Encounter (HOSPITAL_COMMUNITY): Admission: RE | Payer: Self-pay | Source: Home / Self Care

## 2021-11-21 ENCOUNTER — Ambulatory Visit (HOSPITAL_COMMUNITY)
Admission: RE | Admit: 2021-11-21 | Payer: No Typology Code available for payment source | Source: Home / Self Care | Admitting: General Surgery

## 2021-11-21 SURGERY — COLONOSCOPY WITH PROPOFOL
Anesthesia: Monitor Anesthesia Care

## 2021-11-21 NOTE — Telephone Encounter (Signed)
Bowel Preparation  Buy from the store: Miralax bottle (288gm) Gatorade 64oz (not red or purple)  Dulcolax Tablets  The day prior to the procedure: Take 4 Dulcolax tablets at 7am with water.  Drink plenty of clear liquids all day to avoid dehydration.  Do not consume any solid foods.  Consume only clear liquids like clear broth or bouillon, black coffee or tea, clear juice (apple, white grape), clear soft drinks or sports drinks, Jell-O, popsicles, etc. Avoid Red and Purple Dye Mix the entire bottle of Miralax and 64oz of Gatorade. Begin drinking this mixture at 10am. Drink this gradually over the next few hours (8oz every 15- 30 minutes) until you have completed the mixture. Complete the 64oz by 2pm.   IMPORTANT: If you experience preparation-related symptoms (for example, nausea, bloating, or cramping), pause or slow the rate of drinking the additional water until your symptoms diminish. 

## 2021-11-21 NOTE — Telephone Encounter (Signed)
Dr Arnoldo Morale reviewed and approved alternative bowel prep as described below.   Reports that patient will be required to have clear liquid diet x2 days.   Call placed to patient. [place].

## 2021-11-22 NOTE — Telephone Encounter (Signed)
Call placed to patient. LMTRC.  

## 2021-11-23 ENCOUNTER — Ambulatory Visit: Payer: Medicare HMO | Admitting: Cardiology

## 2021-11-23 ENCOUNTER — Telehealth: Payer: Self-pay | Admitting: Student

## 2021-11-23 NOTE — Telephone Encounter (Signed)
Pt would like to be put on the wait list and if a sooner appt comes available he would like to have it virtually. Pt's EKG is in Epic from December 2022.

## 2021-11-23 NOTE — Telephone Encounter (Signed)
Multiple calls placed to patient with no answer and no return call.   Message to be closed.  

## 2021-11-23 NOTE — Telephone Encounter (Signed)
Patient would like to know if his appointment can be done virtually so if a sooner appointment comes up, he would not need to worry about getting transportation 3 days in advance. He states he got his EKG done in December.

## 2021-11-23 NOTE — Progress Notes (Deleted)
Cardiology Office Note  Date: 11/23/2021   ID: Bryan Blair, DOB 12-09-1955, MRN [id number]  PCP:  Dion Saucier, PA-C  Cardiologist:  Nona Dell, MD Electrophysiologist:  None   No chief complaint on file.   History of Present Illness: Bryan Blair is a 66 y.o. male last seen in February 2022.  Past Medical History:  Diagnosis Date   Anxiety    BPH (benign prostatic hyperplasia)    COPD (chronic obstructive pulmonary disease) (HCC)    Coronary artery disease    Angioplasty LAD 1995, angioplasty circumflex 1996, stent intervention circumflex 2000   Essential hypertension    Hyperlipidemia    Legally blind    Neuropathy    Sleep apnea    STEMI (ST elevation myocardial infarction) (HCC) 2000   Type 2 diabetes mellitus (HCC)     Past Surgical History:  Procedure Laterality Date   BACK SURGERY     HAND SURGERY     HERNIA REPAIR     JOINT REPLACEMENT     TONSILLECTOMY      Current Outpatient Medications  Medication Sig Dispense Refill   albuterol (VENTOLIN HFA) 108 (90 Base) MCG/ACT inhaler albuterol sulfate HFA 90 mcg/actuation aerosol inhaler     amoxicillin-clavulanate (AUGMENTIN) 875-125 MG tablet Take 1 tablet by mouth 2 (two) times daily for 10 days. Take 1 pill with breakfast and 1 pill with dinner 20 tablet 0   aspirin 81 MG tablet Take 81 mg by mouth 2 (two) times daily.     azithromycin (ZITHROMAX) 250 MG tablet Take 2 on day one then 1 daily x 4 days 6 tablet 0   Budeson-Glycopyrrol-Formoterol (BREZTRI AEROSPHERE) 160-9-4.8 MCG/ACT AERO Inhale 2 puffs into the lungs in the morning and at bedtime. 10.7 g 0   famotidine (PEPCID) 20 MG tablet Take 20 mg by mouth daily as needed.     Fluticasone-Umeclidin-Vilant (TRELEGY ELLIPTA) 100-62.5-25 MCG/INH AEPB Inhale into the lungs daily.     glipiZIDE (GLUCOTROL) 10 MG tablet Take 10 mg by mouth 2 (two) times daily before a meal.     guaiFENesin (MUCINEX) 600 MG 12 hr tablet Take 600 mg by mouth 2  (two) times daily.     ibuprofen (ADVIL) 200 MG tablet Take 200 mg by mouth every 8 (eight) hours as needed.     isosorbide mononitrate (IMDUR) 30 MG 24 hr tablet Take 30 mg by mouth daily.     losartan (COZAAR) 25 MG tablet Take 25 mg by mouth daily.     Multiple Vitamin (MULTIVITAMIN WITH MINERALS) TABS tablet Take 1 tablet by mouth daily.     nitroGLYCERIN (NITROSTAT) 0.4 MG SL tablet Place 0.4 mg under the tongue every 5 (five) minutes as needed for chest pain.     pioglitazone (ACTOS) 45 MG tablet Take 45 mg by mouth daily.     rosuvastatin (CRESTOR) 20 MG tablet Take 10 mg by mouth daily.     Sodium Sulfate-Mag Sulfate-KCl (SUTAB) [phone] MG TABS Take as Directed 24 tablet 0   tamsulosin (FLOMAX) 0.4 MG CAPS capsule Take 0.4 mg by mouth daily.     No current facility-administered medications for this visit.   Allergies:  Iodine, Shellfish allergy, Sodium hyaluronate (avian), Prochlorperazine maleate, and Venomil honey bee venom [honey bee venom]   Social History: The patient  reports that he has been smoking cigarettes. He has quit using smokeless tobacco. He reports that he does not drink alcohol and does not use drugs.   Family  History: The patient's family history includes Heart disease in his father and mother; Stroke in his father.   ROS:  Please see the history of present illness. Otherwise, complete review of systems is positive for {NONE DEFAULTED:18576}.  All other systems are reviewed and negative.   Physical Exam: VS:  There were no vitals taken for this visit., BMI There is no height or weight on file to calculate BMI.  Wt Readings from Last 3 Encounters:  11/07/21 (!) 329 lb (149.2 kg)  10/24/21 (!) 319 lb (144.7 kg)  09/13/21 (!) 315 lb (142.9 kg)    General: Patient appears comfortable at rest. HEENT: Conjunctiva and lids normal, oropharynx clear with moist mucosa. Neck: Supple, no elevated JVP or carotid bruits, no thyromegaly. Lungs: Clear to auscultation,  nonlabored breathing at rest. Cardiac: Regular rate and rhythm, no S3 or significant systolic murmur, no pericardial rub. Abdomen: Soft, nontender, no hepatomegaly, bowel sounds present, no guarding or rebound. Extremities: No pitting edema, distal pulses 2+. Skin: Warm and dry. Musculoskeletal: No kyphosis. Neuropsychiatric: Alert and oriented x3, affect grossly appropriate.  ECG:  An ECG dated 09/13/2021 was personally reviewed today and demonstrated:  Sinus rhythm with low voltage.  Recent Labwork: 09/13/2021: ALT 16; AST 16; B Natriuretic Peptide 85.0; BUN 17; Creatinine, Ser 0.95; Hemoglobin 13.2; Platelets 223; Potassium 4.6; Sodium 140   Other Studies Reviewed Today:  No interval cardiac testing for review today.  Assessment and Plan:    Medication Adjustments/Labs and Tests Ordered: Current medicines are reviewed at length with the patient today.  Concerns regarding medicines are outlined above.   Tests Ordered: No orders of the defined types were placed in this encounter.   Medication Changes: No orders of the defined types were placed in this encounter.   Disposition:  Follow up {follow up:15908}  Signed, Jonelle Sidle, MD, [facility] 11/23/2021 8:22 AM    [facility] at [facility] [address] [place], [place], [place] [address] Phone: [phone]; Fax: [phone]

## 2022-01-22 DIAGNOSIS — H2521 Age-related cataract, morgagnian type, right eye: Secondary | ICD-10-CM | POA: Diagnosis not present

## 2022-01-22 DIAGNOSIS — H01002 Unspecified blepharitis right lower eyelid: Secondary | ICD-10-CM | POA: Diagnosis not present

## 2022-01-22 DIAGNOSIS — H01001 Unspecified blepharitis right upper eyelid: Secondary | ICD-10-CM | POA: Diagnosis not present

## 2022-01-22 DIAGNOSIS — H2512 Age-related nuclear cataract, left eye: Secondary | ICD-10-CM | POA: Diagnosis not present

## 2022-02-02 ENCOUNTER — Ambulatory Visit (INDEPENDENT_AMBULATORY_CARE_PROVIDER_SITE_OTHER): Payer: No Typology Code available for payment source | Admitting: Student

## 2022-02-02 ENCOUNTER — Encounter: Payer: Self-pay | Admitting: Student

## 2022-02-02 ENCOUNTER — Encounter: Payer: Self-pay | Admitting: *Deleted

## 2022-02-02 VITALS — BP 138/84 | HR 58 | Ht 67.0 in | Wt 321.0 lb

## 2022-02-02 DIAGNOSIS — E785 Hyperlipidemia, unspecified: Secondary | ICD-10-CM | POA: Diagnosis not present

## 2022-02-02 DIAGNOSIS — I25119 Atherosclerotic heart disease of native coronary artery with unspecified angina pectoris: Secondary | ICD-10-CM

## 2022-02-02 DIAGNOSIS — I10 Essential (primary) hypertension: Secondary | ICD-10-CM | POA: Diagnosis not present

## 2022-02-02 DIAGNOSIS — I25118 Atherosclerotic heart disease of native coronary artery with other forms of angina pectoris: Secondary | ICD-10-CM | POA: Diagnosis not present

## 2022-02-02 DIAGNOSIS — Z720 Tobacco use: Secondary | ICD-10-CM

## 2022-02-02 MED ORDER — FUROSEMIDE 20 MG PO TABS: 20.0000 mg | ORAL_TABLET | Freq: Every day | ORAL | 2 refills | Status: DC | PRN

## 2022-02-02 NOTE — Patient Instructions (Signed)
Medication Instructions:  ? ?Take Lasix 20 mg As Needed for fluid  ? ?*If you need a refill on your cardiac medications before your next appointment, please call your pharmacy* ? ? ?Lab Work: ?None  ? ?If you have labs (blood work) drawn today and your tests are completely normal, you will receive your results only by: ?MyChart Message (if you have MyChart) OR ?A paper copy in the mail ?If you have any lab test that is abnormal or we need to change your treatment, we will call you to review the results. ? ? ?Testing/Procedures: ?None  ? ? ?Follow-Up: ?At [facility], you and your health needs are our priority.  As part of our continuing mission to provide you with exceptional heart care, we have created designated Provider Care Teams.  These Care Teams include your primary Cardiologist (physician) and Advanced Practice Providers (APPs -  Physician Assistants and Nurse Practitioners) who all work together to provide you with the care you need, when you need it. ? ?We recommend signing up for the patient portal called "MyChart".  Sign up information is provided on this After Visit Summary.  MyChart is used to connect with patients for Virtual Visits (Telemedicine).  Patients are able to view lab/test results, encounter notes, upcoming appointments, etc.  Non-urgent messages can be sent to your provider as well.   ?To learn more about what you can do with MyChart, go to [email or web address].   ? ?Your next appointment:   ?6 month(s) ? ?The format for your next appointment:   ?In Person ? ?Provider:   ?Rozann Lesches, MD  ? ? ?Other Instructions ?Thank you for choosing [facility]! ? ? ? ?Important Information About Sugar ? ? ? ? ? ? ?

## 2022-02-02 NOTE — Progress Notes (Signed)
? ?Cardiology Office Note   ? ?Date:  02/03/2022  ? ?ID:  Bryan Blair, DOB 09-30-1956, MRN [id number] ? ?PCP:  Dion SaucierBoles, Alyssa H, PA-C  ?Cardiologist: Nona DellSamuel McDowell, MD   ? ?Chief Complaint  ?Patient presents with  ? Follow-up  ?  Annual Visit  ? ? ?History of Present Illness:   ? ?Bryan Blair is a 66 y.o. male with past medical history of CAD (s/p angioplasty to LAD in 1995 and angioplasty to LCx in 1996, stenting to LCx in 2000 and caths in 01/2015 and 11/2017 showing patent stents), HTN, HLD, COPD and OSA (intolerant to CPAP) who presents to the office today for annual follow-up. ? ?He did establish care with Dr. Diona BrownerMcDowell in 11/2020 and denied any recent anginal symptoms at that time he was continued on his current cardiac medications including ASA, Imdur, Losartan and Crestor.  He was on beta-blocker therapy given his resting heart rate in the 50's to 60's.  ? ?In talking with the patient today, he reports having baseline dyspnea on exertion for several years. Feels like this progressed in the setting of inactivity as his bilateral knee pain has increased. He is trying to be active for at least 1.5 hours each day now. He denies any exertional chest pain or palpitations. Last used SL NTG x1 2-3 months back with no recent use. No reported orthopnea or PND. He does experience intermittent lower extremity edema and previously had an Rx for PRN Lasix but this expired.  ? ?Past Medical History:  ?Diagnosis Date  ? Anxiety   ? BPH (benign prostatic hyperplasia)   ? COPD (chronic obstructive pulmonary disease) (HCC)   ? Coronary artery disease   ? Angioplasty LAD 1995, angioplasty circumflex 1996, stent intervention circumflex 2000  ? Essential hypertension   ? Hyperlipidemia   ? Legally blind   ? Neuropathy   ? Sleep apnea   ? STEMI (ST elevation myocardial infarction) (HCC) 2000  ? Type 2 diabetes mellitus (HCC)   ? ? ?Past Surgical History:  ?Procedure Laterality Date  ? BACK SURGERY    ? HAND SURGERY    ?  HERNIA REPAIR    ? JOINT REPLACEMENT    ? TONSILLECTOMY    ? ? ?Current Medications: ?Outpatient Medications Prior to Visit  ?Medication Sig Dispense Refill  ? albuterol (VENTOLIN HFA) 108 (90 Base) MCG/ACT inhaler albuterol sulfate HFA 90 mcg/actuation aerosol inhaler    ? aspirin 81 MG tablet Take 81 mg by mouth 2 (two) times daily.    ? azithromycin (ZITHROMAX) 250 MG tablet Take 2 on day one then 1 daily x 4 days 6 tablet 0  ? Budeson-Glycopyrrol-Formoterol (BREZTRI AEROSPHERE) 160-9-4.8 MCG/ACT AERO Inhale 2 puffs into the lungs in the morning and at bedtime. 10.7 g 0  ? famotidine (PEPCID) 20 MG tablet Take 20 mg by mouth daily as needed.    ? Fluticasone-Umeclidin-Vilant (TRELEGY ELLIPTA) 100-62.5-25 MCG/INH AEPB Inhale into the lungs daily.    ? glipiZIDE (GLUCOTROL) 10 MG tablet Take 10 mg by mouth 2 (two) times daily before a meal.    ? guaiFENesin (MUCINEX) 600 MG 12 hr tablet Take 600 mg by mouth 2 (two) times daily.    ? ibuprofen (ADVIL) 200 MG tablet Take 200 mg by mouth every 8 (eight) hours as needed.    ? isosorbide mononitrate (IMDUR) 30 MG 24 hr tablet Take 30 mg by mouth daily.    ? losartan (COZAAR) 25 MG tablet Take 25 mg by mouth daily.    ?  Multiple Vitamin (MULTIVITAMIN WITH MINERALS) TABS tablet Take 1 tablet by mouth daily.    ? nitroGLYCERIN (NITROSTAT) 0.4 MG SL tablet Place 0.4 mg under the tongue every 5 (five) minutes as needed for chest pain.    ? pioglitazone (ACTOS) 45 MG tablet Take 45 mg by mouth daily.    ? rosuvastatin (CRESTOR) 20 MG tablet Take 10 mg by mouth daily.    ? Sodium Sulfate-Mag Sulfate-KCl (SUTAB) [phone] MG TABS Take as Directed 24 tablet 0  ? tamsulosin (FLOMAX) 0.4 MG CAPS capsule Take 0.4 mg by mouth daily.    ? ?No facility-administered medications prior to visit.  ?  ? ?Allergies:   Iodine, Shellfish allergy, Sodium hyaluronate (avian), Prochlorperazine maleate, and Venomil honey bee venom [honey bee venom]  ? ?Social History  ? ?Socioeconomic History   ? Marital status: Single  ?  Spouse name: Not on file  ? Number of children: Not on file  ? Years of education: Not on file  ? Highest education level: Not on file  ?Occupational History  ? Not on file  ?Tobacco Use  ? Smoking status: Every Day  ?  Types: Cigarettes  ? Smokeless tobacco: Former  ?Vaping Use  ? Vaping Use: Some days  ?Substance and Sexual Activity  ? Alcohol use: No  ? Drug use: No  ? Sexual activity: Not on file  ?Other Topics Concern  ? Not on file  ?Social History Narrative  ? ** Merged History Encounter **  ?    ? ?Social Determinants of Health  ? ?Financial Resource Strain: Not on file  ?Food Insecurity: Not on file  ?Transportation Needs: Not on file  ?Physical Activity: Not on file  ?Stress: Not on file  ?Social Connections: Not on file  ?  ? ?Family History:  The patient's family history includes Heart disease in his father and mother; Stroke in his father.  ? ?Review of Systems:   ? ?Please see the history of present illness.    ? ?All other systems reviewed and are otherwise negative except as noted above. ? ? ?Physical Exam:   ? ?VS:  BP 138/84   Pulse (!) 58   Ht 5\' 7"  (1.702 m)   Wt (!) 321 lb (145.6 kg)   SpO2 94%   BMI 50.28 kg/m?    ?General: Pleasant obese male appearing in no acute distress. ?Head: Normocephalic, atraumatic. ?Neck: No carotid bruits. JVD difficult to assess.  ?Lungs: Respirations regular and unlabored, without wheezes or rales.  ?Heart: Regular rate and rhythm. No S3 or S4.  No murmur, no rubs, or gallops appreciated. ?Abdomen: Appears non-distended. No obvious abdominal masses. ?Msk:  Strength and tone appear normal for age. No obvious joint deformities or effusions. ?Extremities: No clubbing or cyanosis. Trace lower extremity edema bilaterally.  Distal pedal pulses are 2+ bilaterally. ?Neuro: Alert and oriented X 3. Moves all extremities spontaneously. No focal deficits noted. ?Psych:  Responds to questions appropriately with a normal affect. ?Skin: No  rashes or lesions noted ? ?Wt Readings from Last 3 Encounters:  ?02/02/22 (!) 321 lb (145.6 kg)  ?11/07/21 (!) 329 lb (149.2 kg)  ?10/24/21 (!) 319 lb (144.7 kg)  ?  ? ?Studies/Labs Reviewed:  ? ?EKG:  EKG is ordered today.  The ekg ordered today demonstrates sinus bradycardia, HR 58 with isolated TWI along Lead III.   ? ?Recent Labs: ?09/13/2021: ALT 16; B Natriuretic Peptide 85.0; BUN 17; Creatinine, Ser 0.95; Hemoglobin 13.2; Platelets 223; Potassium 4.6; Sodium  140  ? ?Lipid Panel ?No results found for: CHOL, TRIG, HDL, CHOLHDL, VLDL, LDLCALC, LDLDIRECT ? ?Additional studies/ records that were reviewed today include:  ? ?Cardiac Catheterization: 11/2017 ?#1. Insignificant coronary artery disease  ?#2. Negative iFR of mid LCx: 1.0  ? ?Recommendations:  ? ?#1. Medical therapy ? ?PFT's: 2019 ?Pulmonary function testing was obtained while the patient was in clinic. He had no significant baseline obstruction with an FEV1/FVC of 71% and an FEV1 of 79% predicted. He did not have a significant response to bronchodilators in terms of either his FEV1 or FVC. He did have a 17% improvement in his FEF 25 to 75% after albuterol. He had normal lung volumes. He does have a significant reduction in his diffusing capacity to 64.5% of predicted. He had a normal hemoglobin. ? ?Assessment:   ? ?1. Coronary artery disease of native artery of native heart with stable angina pectoris (HCC)   ?2. Essential hypertension   ?3. Hyperlipidemia LDL goal <70   ?4. Tobacco abuse   ? ? ? ?Plan:  ? ?In order of problems listed above: ? ?1. CAD ?- He is s/p angioplasty to LAD in 1995 and angioplasty to LCx in 1996 with stenting to LCx in 2000 and most recent caths in 01/2015 and 11/2017 showed patent stents. ?- He has baseline dyspnea on exertion which is likely multifactorial in the setting of deconditioning, COPD, untreated OSA and continued tobacco use. No recent exertional chest pain.  ?- Continue current medical therapy with ASA 81mg  daily,  Imdur 30mg  daily, Losartan 25mg  daily and Crestor 10mg  daily. He is no longer on BB therapy due to baseline bradycardia.  ? ?2. HTN ?- His BP is at 138/84 during today's visit and he reports it is typically lower

## 2022-02-03 ENCOUNTER — Encounter: Payer: Self-pay | Admitting: Student

## 2022-02-13 ENCOUNTER — Ambulatory Visit: Payer: No Typology Code available for payment source | Admitting: Internal Medicine

## 2022-02-26 ENCOUNTER — Other Ambulatory Visit (HOSPITAL_COMMUNITY): Payer: Self-pay | Admitting: Physician Assistant

## 2022-02-26 ENCOUNTER — Other Ambulatory Visit: Payer: Self-pay | Admitting: Physician Assistant

## 2022-02-26 DIAGNOSIS — M79602 Pain in left arm: Secondary | ICD-10-CM

## 2022-02-26 DIAGNOSIS — M542 Cervicalgia: Secondary | ICD-10-CM | POA: Diagnosis not present

## 2022-02-26 DIAGNOSIS — Z9889 Other specified postprocedural states: Secondary | ICD-10-CM | POA: Diagnosis not present

## 2022-02-26 DIAGNOSIS — R202 Paresthesia of skin: Secondary | ICD-10-CM | POA: Diagnosis not present

## 2022-02-26 DIAGNOSIS — I10 Essential (primary) hypertension: Secondary | ICD-10-CM | POA: Diagnosis not present

## 2022-02-26 DIAGNOSIS — M79601 Pain in right arm: Secondary | ICD-10-CM | POA: Diagnosis not present

## 2022-02-26 DIAGNOSIS — R29898 Other symptoms and signs involving the musculoskeletal system: Secondary | ICD-10-CM | POA: Diagnosis not present

## 2022-02-26 DIAGNOSIS — M545 Low back pain, unspecified: Secondary | ICD-10-CM | POA: Diagnosis not present

## 2022-03-02 ENCOUNTER — Other Ambulatory Visit: Payer: Self-pay

## 2022-03-02 ENCOUNTER — Encounter (HOSPITAL_COMMUNITY): Payer: Self-pay | Admitting: *Deleted

## 2022-03-02 ENCOUNTER — Emergency Department (HOSPITAL_COMMUNITY): Payer: No Typology Code available for payment source

## 2022-03-02 ENCOUNTER — Emergency Department (HOSPITAL_COMMUNITY)
Admission: EM | Admit: 2022-03-02 | Discharge: 2022-03-03 | Disposition: A | Discharge status: Home / Self Care | Payer: No Typology Code available for payment source | Attending: Emergency Medicine | Admitting: Emergency Medicine

## 2022-03-02 DIAGNOSIS — I10 Essential (primary) hypertension: Secondary | ICD-10-CM | POA: Diagnosis not present

## 2022-03-02 DIAGNOSIS — Z7951 Long term (current) use of inhaled steroids: Secondary | ICD-10-CM | POA: Insufficient documentation

## 2022-03-02 DIAGNOSIS — M50221 Other cervical disc displacement at C4-C5 level: Secondary | ICD-10-CM | POA: Diagnosis not present

## 2022-03-02 DIAGNOSIS — J449 Chronic obstructive pulmonary disease, unspecified: Secondary | ICD-10-CM | POA: Diagnosis not present

## 2022-03-02 DIAGNOSIS — E119 Type 2 diabetes mellitus without complications: Secondary | ICD-10-CM | POA: Diagnosis not present

## 2022-03-02 DIAGNOSIS — M4802 Spinal stenosis, cervical region: Secondary | ICD-10-CM | POA: Insufficient documentation

## 2022-03-02 DIAGNOSIS — R531 Weakness: Secondary | ICD-10-CM | POA: Diagnosis not present

## 2022-03-02 DIAGNOSIS — Z981 Arthrodesis status: Secondary | ICD-10-CM | POA: Diagnosis not present

## 2022-03-02 DIAGNOSIS — I2510 Atherosclerotic heart disease of native coronary artery without angina pectoris: Secondary | ICD-10-CM | POA: Insufficient documentation

## 2022-03-02 DIAGNOSIS — Z7982 Long term (current) use of aspirin: Secondary | ICD-10-CM | POA: Insufficient documentation

## 2022-03-02 DIAGNOSIS — Z7984 Long term (current) use of oral hypoglycemic drugs: Secondary | ICD-10-CM | POA: Insufficient documentation

## 2022-03-02 DIAGNOSIS — I672 Cerebral atherosclerosis: Secondary | ICD-10-CM | POA: Diagnosis not present

## 2022-03-02 DIAGNOSIS — R29818 Other symptoms and signs involving the nervous system: Secondary | ICD-10-CM | POA: Diagnosis not present

## 2022-03-02 DIAGNOSIS — M4803 Spinal stenosis, cervicothoracic region: Secondary | ICD-10-CM | POA: Diagnosis not present

## 2022-03-02 LAB — CBC
HCT: 42.5 % (ref 39.0–52.0)
Hemoglobin: 14.2 g/dL (ref 13.0–17.0)
MCH: 31.5 pg (ref 26.0–34.0)
MCHC: 33.4 g/dL (ref 30.0–36.0)
MCV: 94.2 fL (ref 80.0–100.0)
Platelets: 216 10*3/uL (ref 150–400)
RBC: 4.51 MIL/uL (ref 4.22–5.81)
RDW: 14.1 % (ref 11.5–15.5)
WBC: 8.6 10*3/uL (ref 4.0–10.5)
nRBC: 0 % (ref 0.0–0.2)

## 2022-03-02 LAB — BASIC METABOLIC PANEL
Anion gap: 7 (ref 5–15)
BUN: 15 mg/dL (ref 8–23)
CO2: 27 mmol/L (ref 22–32)
Calcium: 9.3 mg/dL (ref 8.9–10.3)
Chloride: 105 mmol/L (ref 98–111)
Creatinine, Ser: 1.04 mg/dL (ref 0.61–1.24)
GFR, Estimated: >60 mL/min (ref >60)
Glucose, Bld: 165 mg/dL — ABNORMAL HIGH (ref 70–99)
Potassium: 4.4 mmol/L (ref 3.5–5.1)
Sodium: 139 mmol/L (ref 135–145)

## 2022-03-02 LAB — URINALYSIS, ROUTINE W REFLEX MICROSCOPIC
Bilirubin Urine: NEGATIVE
Glucose, UA: NEGATIVE mg/dL
Hgb urine dipstick: NEGATIVE
Ketones, ur: NEGATIVE mg/dL
Leukocytes,Ua: NEGATIVE
Nitrite: NEGATIVE
Protein, ur: NEGATIVE mg/dL
Specific Gravity, Urine: 1.014 (ref 1.005–1.030)
pH: 6 (ref 5.0–8.0)

## 2022-03-02 LAB — TROPONIN I (HIGH SENSITIVITY)
Troponin I (High Sensitivity): 86 ng/L — ABNORMAL HIGH (ref <18)
Troponin I (High Sensitivity): 78 ng/L — ABNORMAL HIGH (ref <18)

## 2022-03-02 MED ORDER — SODIUM CHLORIDE 0.9 % IV SOLN: INTRAVENOUS | Status: DC

## 2022-03-02 MED ORDER — LORAZEPAM 2 MG/ML IJ SOLN
1.0000 mg | Freq: Once | INTRAMUSCULAR | Status: AC | PRN
  Administered 2022-03-02: 1 mg via INTRAVENOUS
  Filled 2022-03-02: qty 1

## 2022-03-02 NOTE — Discharge Instructions (Addendum)
  Neurosurgeon in [place]:  [facility]:  [phone]

## 2022-03-02 NOTE — ED Provider Triage Note (Cosign Needed Addendum)
Emergency Medicine Provider Triage Evaluation Note  Chadley Dziedzic , a 66 y.o. male  was evaluated in triage.  Pt complains of worsening weakness bilateral arms and legs, that he feels is radiating around the body, worse in the left arm and hand since Monday, multiple falls secondary to unsteadiness. Symptoms started 6 weeks ago. He reports he feels dizzy, lightheaded as well.  History of cervical spine fusion.  Doctor was concerned about spinal cord compression in the C-spine versus MS versus other.  Patient denies chest pain, shortness of breath, nausea, vomiting, syncope, recent head or neck injury  Review of Systems  Positive: Weakness, dizziness, balance issues Negative: Chest pain, shob, fever, chills  Physical Exam  BP (!) 141/65   Pulse 66   Temp 98.3 F (36.8 C)   Resp 20   Ht 5\' 7"  (1.702 m)   Wt (!) 145.6 kg   SpO2 100%   BMI 50.27 kg/m  Gen:   Awake, no distress   Resp:  Normal effort  MSK:   Moves extremities without difficulty  Other:  No tenderness palpation of the cervical spine, slightly decreased strength of grip bilateral upper and lower extremities otherwise no significant deficits noted, or globally decreased strength throughout.  Patient with normal coordination on my exam.  Gait not assessed secondary to dizziness, concern for fall  Medical Decision Making  Medically screening exam initiated at 6:10 PM.  Appropriate orders placed.  Ishaq Maffei was informed that the remainder of the evaluation will be completed by another provider, this initial triage assessment does not replace that evaluation, and the importance of remaining in the ED until their evaluation is complete.  Workup initiated  Patient may need an MRI of the C-spine versus head, C-spine, T-spine if concern for MS, however will defer to nontriage provider for more detailed exam, and pending CT results.   [name], Olene Floss 03/02/22 1813

## 2022-03-02 NOTE — ED Triage Notes (Signed)
The pt has had weakness for 6 weeks he has been out of town until this past Monday when he finally saw his docto  who told him that he needed a mri  he is also c/o sob none at present  weakness in his lt arm and hand since Monday  and he has had multiple falls since Monday  unsteady gait also

## 2022-03-02 NOTE — ED Provider Notes (Signed)
[facility] EMERGENCY DEPARTMENT Provider Note   CSN: [id number] Arrival date & time: 03/02/22  1703     History  Chief Complaint  Patient presents with   Weakness    Bryan Blair is a 66 y.o. male.  Pt is a 66 yo male with a pmhx significant for CAD, COPD, BPH, sleep apnea, htn, dm2, and anxiety.  Pt said he's been in [place] for the last few weeks.  He has been feeling progressively weak.  He has fallen several times.  Pt said he is now back home and saw his pcp for this problem.  He said his pcp ordered a MRI, but it is not until June 8.  Pt said he feels like his sx are worsening and he's falling more.  He said he is getting very worried.  He also feels a little dizzy.  No cp.      Home Medications Prior to Admission medications   Medication Sig Start Date End Date Taking? Authorizing Provider  albuterol (VENTOLIN HFA) 108 (90 Base) MCG/ACT inhaler albuterol sulfate HFA 90 mcg/actuation aerosol inhaler 07/04/20   [provider]  aspirin 81 MG tablet Take 81 mg by mouth 2 (two) times daily.    [provider]  azithromycin (ZITHROMAX) 250 MG tablet Take 2 on day one then 1 daily x 4 days 11/07/21   Nyoka Cowden, MD  Budeson-Glycopyrrol-Formoterol (BREZTRI AEROSPHERE) 160-9-4.8 MCG/ACT AERO Inhale 2 puffs into the lungs in the morning and at bedtime. 11/07/21   Nyoka Cowden, MD  famotidine (PEPCID) 20 MG tablet Take 20 mg by mouth daily as needed.    [provider]  Fluticasone-Umeclidin-Vilant (TRELEGY ELLIPTA) 100-62.5-25 MCG/INH AEPB Inhale into the lungs daily.    [provider]  furosemide (LASIX) 20 MG tablet Take 1 tablet (20 mg total) by mouth daily as needed for edema. 02/02/22   Strader, Lennart Pall, PA-C  glipiZIDE (GLUCOTROL) 10 MG tablet Take 10 mg by mouth 2 (two) times daily before a meal.    [provider]  guaiFENesin (MUCINEX) 600 MG 12 hr tablet Take 600 mg by mouth 2 (two) times daily.     [provider]  ibuprofen (ADVIL) 200 MG tablet Take 200 mg by mouth every 8 (eight) hours as needed.    [provider]  isosorbide mononitrate (IMDUR) 30 MG 24 hr tablet Take 30 mg by mouth daily.    [provider]  losartan (COZAAR) 25 MG tablet Take 25 mg by mouth daily. 10/19/20   [provider]  Multiple Vitamin (MULTIVITAMIN WITH MINERALS) TABS tablet Take 1 tablet by mouth daily.    [provider]  nitroGLYCERIN (NITROSTAT) 0.4 MG SL tablet Place 0.4 mg under the tongue every 5 (five) minutes as needed for chest pain.    [provider]  pioglitazone (ACTOS) 45 MG tablet Take 45 mg by mouth daily. 07/09/18   [provider]  rosuvastatin (CRESTOR) 20 MG tablet Take 10 mg by mouth daily. 10/19/20   [provider]  Sodium Sulfate-Mag Sulfate-KCl (SUTAB) 825 507 4815 MG TABS Take as Directed 10/24/21   Pappayliou, Santina Evans A, DO  tamsulosin (FLOMAX) 0.4 MG CAPS capsule Take 0.4 mg by mouth daily.    [provider]      Allergies    Iodine, Shellfish allergy, Sodium hyaluronate (avian), Prochlorperazine maleate, and Venomil honey bee venom [honey bee venom]    Review of Systems   Review of Systems  Neurological:  Positive for dizziness and weakness.  All other systems reviewed and are negative.  Physical Exam Updated Vital Signs BP (!) 161/73   Pulse (!) 58   Temp 98.3 F (36.8 C)   Resp 16   Ht 5\' 7"  (1.702 m)   Wt (!) 145.6 kg   SpO2 96%   BMI 50.27 kg/m  Physical Exam Vitals and nursing note reviewed.  Constitutional:      Appearance: Normal appearance. He is obese.  HENT:     Head: Normocephalic and atraumatic.     Right Ear: External ear normal.     Left Ear: External ear normal.     Nose: Nose normal.     Mouth/Throat:     Mouth: Mucous membranes are dry.  Eyes:     Extraocular Movements: Extraocular movements intact.     Pupils: Pupils are equal, round, and reactive to light.   Cardiovascular:     Rate and Rhythm: Normal rate and regular rhythm.     Pulses: Normal pulses.     Heart sounds: Normal heart sounds.  Pulmonary:     Effort: Pulmonary effort is normal.     Breath sounds: Normal breath sounds.  Abdominal:     General: Abdomen is flat. Bowel sounds are normal.     Palpations: Abdomen is soft.  Musculoskeletal:        General: Normal range of motion.     Cervical back: Normal range of motion and neck supple.  Skin:    General: Skin is warm.     Capillary Refill: Capillary refill takes less than 2 seconds.  Neurological:     General: No focal deficit present.     Mental Status: He is alert and oriented to person, place, and time.  Psychiatric:        Mood and Affect: Mood normal.        Behavior: Behavior normal.    ED Results / Procedures / Treatments   Labs (all labs ordered are listed, but only abnormal results are displayed) Labs Reviewed  BASIC METABOLIC PANEL - Abnormal; Notable for the following components:      Result Value   Glucose, Bld 165 (*)    All other components within normal limits  TROPONIN I (HIGH SENSITIVITY) - Abnormal; Notable for the following components:   Troponin I (High Sensitivity) 86 (*)    All other components within normal limits  TROPONIN I (HIGH SENSITIVITY) - Abnormal; Notable for the following components:   Troponin I (High Sensitivity) 78 (*)    All other components within normal limits  CBC  URINALYSIS, ROUTINE W REFLEX MICROSCOPIC    EKG EKG Interpretation  Date/Time:  Friday Mar 02 2022 19:28:11 EDT Ventricular Rate:  68 PR Interval:  181 QRS Duration: 102 QT Interval:  415 QTC Calculation: 442 R Axis:   12 Text Interpretation: Sinus rhythm Low voltage, precordial leads No significant change since last tracing Confirmed by Jacalyn LefevreHaviland, Demarqus Jocson [phone]([id number]) on 03/02/2022 7:55:19 PM  Radiology CT Head Wo Contrast  Result Date: 03/02/2022 CLINICAL DATA:  Neuro deficit, stroke suspected EXAM: CT HEAD  WITHOUT CONTRAST TECHNIQUE: Contiguous axial images were obtained from the base of the skull through the vertex without intravenous contrast. RADIATION DOSE REDUCTION: This exam was performed according to the departmental dose-optimization program which includes automated exposure control, adjustment of the mA and/or kV according to patient size and/or use of iterative reconstruction technique. COMPARISON:  None Available. FINDINGS: Brain: No evidence of acute infarction, hemorrhage, hydrocephalus,  extra-axial collection or mass lesion/mass effect. Mild subcortical white matter and periventricular small vessel ischemic changes. Vascular: Intracranial atherosclerosis. Skull: Normal. Negative for fracture or focal lesion. Sinuses/Orbits: The visualized paranasal sinuses are essentially clear. The mastoid air cells are unopacified. Other: None. IMPRESSION: No acute intracranial abnormality. Mild small vessel ischemic changes. Electronically Signed   By: Charline Bills M.D.   On: 03/02/2022 19:05   CT Cervical Spine Wo Contrast  Result Date: 03/02/2022 CLINICAL DATA:  Spinal stenosis, neuro deficit. EXAM: CT CERVICAL SPINE WITHOUT CONTRAST TECHNIQUE: Multidetector CT imaging of the cervical spine was performed without intravenous contrast. Multiplanar CT image reconstructions were also generated. RADIATION DOSE REDUCTION: This exam was performed according to the departmental dose-optimization program which includes automated exposure control, adjustment of the mA and/or kV according to patient size and/or use of iterative reconstruction technique. COMPARISON:  Cervical spine CT 09/13/2021 FINDINGS: Alignment: There is 2 mm of anterolisthesis at C7-T1, unchanged. Alignment is otherwise anatomic. Skull base and vertebrae: No acute fracture. No primary bone lesion or focal pathologic process. Examination is mildly technically limited secondary to motion artifact. Soft tissues and spinal canal: No prevertebral fluid  or swelling. No visible canal hematoma. Disc levels: Prior C6-C7 vertebral body fusion changes are similar to the prior study. There is mild disc space narrowing at C5-C6 and C7-T1, unchanged. Scattered endplate osteophytes are also unchanged. There is bilateral facet arthropathy diffusely causing moderate neural foraminal stenosis at all levels. There is severe right neural foraminal stenosis at C5-C6 secondary to uncovertebral spurring (progressed compared to prior). Central spinal canal stenosis is seen diffusely from C3 through T1 similar to prior. Upper chest: Negative. Other: None. IMPRESSION: 1. No acute fracture or traumatic subluxation of the cervical spine. 2. Multilevel degenerative changes are again seen throughout the cervical spine. There has been progression of severe right-sided neural foraminal stenosis at C5-C6. Degenerative changes are otherwise stable. Electronically Signed   By: Darliss Cheney M.D.   On: 03/02/2022 19:07   MR BRAIN WO CONTRAST  Result Date: 03/02/2022 CLINICAL DATA:  Worsening weakness in bilateral arms and legs, stroke suspected EXAM: MRI HEAD WITHOUT CONTRAST MRI CERVICAL SPINE WITHOUT CONTRAST TECHNIQUE: Multiplanar, multiecho pulse sequences of the brain and surrounding structures, and cervical spine, to include the craniocervical junction and cervicothoracic junction, were obtained without intravenous contrast. COMPARISON:  No prior MRI of the head or cervical spine, correlation is made with 03/02/2022 CT head and 09/13/2021 CT cervical spine FINDINGS: MRI HEAD FINDINGS Evaluation is limited by motion artifact. Brain: No restricted diffusion to suggest acute or subacute infarct. No acute hemorrhage, mass, mass effect, or midline shift. No hydrocephalus or extra-axial collection. T2 hyperintense signal in the periventricular white matter, likely the sequela of chronic small vessel ischemic disease. Vascular: Normal arterial flow voids. Skull and upper cervical spine:  Marrow evaluation is limited by motion artifact on the T1 sequences. Sinuses/Orbits: No acute finding in the sinuses or orbits. Ovoid, elongated globes bilaterally, possibly staphylomas. Other: Trace fluid in the mastoid air cells. MRI CERVICAL SPINE FINDINGS Evaluation is limited by motion artifact. Alignment: Straightening of the normal cervical lordosis. Trace anterolisthesis of C7 on T1. Vertebrae: No acute fracture or suspicious osseous lesion. Osseous fusion of C6 and C7, which may be postsurgical or degenerative. Congenitally short pedicles, which narrow the AP diameter of the spinal canal. Cord: Limited by motion artifact.  No definite signal abnormality. Posterior Fossa, vertebral arteries, paraspinal tissues: No acute finding. Disc levels: The axial sequences are nearly nondiagnostic  due to the degree of motion. C2-C3: No significant disc bulge. No spinal canal stenosis or neural foraminal narrowing. C3-C4: Mild disc bulge. Ligamentum flavum hypertrophy. Moderate spinal canal stenosis. The neural foramina can not be evaluated. C4-C5: Moderate disc bulge. Severe spinal canal stenosis. The neural foramina can not be evaluated. C5-C6: Mild disc bulge. Mild-to-moderate spinal canal stenosis. The neural foramina can not be evaluated. C6-C7: Osseous fusion across the disc space. No spinal canal stenosis. The neural foramina can not be evaluated. C7-T1: Trace anterolisthesis with disc unroofing. Mild spinal canal stenosis. The neural foramina can not be evaluated. IMPRESSION: 1. Evaluation of the brain is limited by motion artifact. Within this limitation, no acute intracranial process. No evidence of acute or subacute infarct. 2. Evaluation of the cervical spine is significantly limited by motion artifact. Within this limitation, there is multifocal disc degenerative disease, superimposed on short pedicles, which results in severe spinal canal stenosis at C4-C5, moderate spinal canal stenosis at C3-C4,  mild-to-moderate spinal canal stenosis at C5-C6, and mild spinal canal stenosis at C7-T1. Evaluation of the neural foramina is not possible due to the degree of motion on the axial sequences. Electronically Signed   By: Wiliam Ke M.D.   On: 03/02/2022 21:35   MR Cervical Spine Wo Contrast  Result Date: 03/02/2022 CLINICAL DATA:  Worsening weakness in bilateral arms and legs, stroke suspected EXAM: MRI HEAD WITHOUT CONTRAST MRI CERVICAL SPINE WITHOUT CONTRAST TECHNIQUE: Multiplanar, multiecho pulse sequences of the brain and surrounding structures, and cervical spine, to include the craniocervical junction and cervicothoracic junction, were obtained without intravenous contrast. COMPARISON:  No prior MRI of the head or cervical spine, correlation is made with 03/02/2022 CT head and 09/13/2021 CT cervical spine FINDINGS: MRI HEAD FINDINGS Evaluation is limited by motion artifact. Brain: No restricted diffusion to suggest acute or subacute infarct. No acute hemorrhage, mass, mass effect, or midline shift. No hydrocephalus or extra-axial collection. T2 hyperintense signal in the periventricular white matter, likely the sequela of chronic small vessel ischemic disease. Vascular: Normal arterial flow voids. Skull and upper cervical spine: Marrow evaluation is limited by motion artifact on the T1 sequences. Sinuses/Orbits: No acute finding in the sinuses or orbits. Ovoid, elongated globes bilaterally, possibly staphylomas. Other: Trace fluid in the mastoid air cells. MRI CERVICAL SPINE FINDINGS Evaluation is limited by motion artifact. Alignment: Straightening of the normal cervical lordosis. Trace anterolisthesis of C7 on T1. Vertebrae: No acute fracture or suspicious osseous lesion. Osseous fusion of C6 and C7, which may be postsurgical or degenerative. Congenitally short pedicles, which narrow the AP diameter of the spinal canal. Cord: Limited by motion artifact.  No definite signal abnormality. Posterior Fossa,  vertebral arteries, paraspinal tissues: No acute finding. Disc levels: The axial sequences are nearly nondiagnostic due to the degree of motion. C2-C3: No significant disc bulge. No spinal canal stenosis or neural foraminal narrowing. C3-C4: Mild disc bulge. Ligamentum flavum hypertrophy. Moderate spinal canal stenosis. The neural foramina can not be evaluated. C4-C5: Moderate disc bulge. Severe spinal canal stenosis. The neural foramina can not be evaluated. C5-C6: Mild disc bulge. Mild-to-moderate spinal canal stenosis. The neural foramina can not be evaluated. C6-C7: Osseous fusion across the disc space. No spinal canal stenosis. The neural foramina can not be evaluated. C7-T1: Trace anterolisthesis with disc unroofing. Mild spinal canal stenosis. The neural foramina can not be evaluated. IMPRESSION: 1. Evaluation of the brain is limited by motion artifact. Within this limitation, no acute intracranial process. No evidence of acute or subacute  infarct. 2. Evaluation of the cervical spine is significantly limited by motion artifact. Within this limitation, there is multifocal disc degenerative disease, superimposed on short pedicles, which results in severe spinal canal stenosis at C4-C5, moderate spinal canal stenosis at C3-C4, mild-to-moderate spinal canal stenosis at C5-C6, and mild spinal canal stenosis at C7-T1. Evaluation of the neural foramina is not possible due to the degree of motion on the axial sequences. Electronically Signed   By: Wiliam Ke M.D.   On: 03/02/2022 21:35    Procedures Procedures    Medications Ordered in ED Medications  0.9 %  sodium chloride infusion ( Intravenous Not Given 03/02/22 2316)  LORazepam (ATIVAN) injection 1 mg (1 mg Intravenous Given 03/02/22 2021)    ED Course/ Medical Decision Making/ A&P                           Medical Decision Making Amount and/or Complexity of Data Reviewed Labs: ordered. Radiology: ordered.  Risk Prescription drug  management.   This patient presents to the ED for concern of weakness, this involves an extensive number of treatment options, and is a complaint that carries with it a high risk of complications and morbidity.  The differential diagnosis includes cva, cervical problem   Co morbidities that complicate the patient evaluation  CAD, COPD, BPH, sleep apnea, htn, dm2, and anxiety   Additional history obtained:  Additional history obtained from epic chart review   Lab Tests:  I Ordered, and personally interpreted labs.  The pertinent results include:  cbc nl, bmp nl other than glucose elevated at 165; ua neg; first trop 86; 2nd trop 78.   Imaging Studies ordered:  I ordered imaging studies including ct head/ct c-spine/mri  I independently visualized and interpreted imaging which showed  CT head:   IMPRESSION:  No acute intracranial abnormality.     Mild small vessel ischemic changes.  CT cervical spine:   IMPRESSION:  1. No acute fracture or traumatic subluxation of the cervical spine.  2. Multilevel degenerative changes are again seen throughout the  cervical spine. There has been progression of severe right-sided  neural foraminal stenosis at C5-C6. Degenerative changes are  otherwise stable.  MRI brain and cervical spine:   IMPRESSION:  1. Evaluation of the brain is limited by motion artifact. Within  this limitation, no acute intracranial process. No evidence of acute  or subacute infarct.  2. Evaluation of the cervical spine is significantly limited by  motion artifact. Within this limitation, there is multifocal disc  degenerative disease, superimposed on short pedicles, which results  in severe spinal canal stenosis at C4-C5, moderate spinal canal  stenosis at C3-C4, mild-to-moderate spinal canal stenosis at C5-C6,  and mild spinal canal stenosis at C7-T1. Evaluation of the neural  foramina is not possible due to the degree of motion on the axial  sequences.   I  agree with the radiologist interpretation   Cardiac Monitoring:  The patient was maintained on a cardiac monitor.  I personally viewed and interpreted the cardiac monitored which showed an underlying rhythm of: nsr   Medicines ordered and prescription drug management:   I have reviewed the patients home medicines and have made adjustments as needed   Test Considered:  mri   Critical Interventions:  mri    Problem List / ED Course:  Multiple falls and leg weakness:  likely due to cervical spinal stenosis.  Pt given the number of NS to f/u.  Pt is legally blind with transportation issues.  I have requested toc consult to do some home PT.  He is also given a rx for a walker and a cane.  He is to return if worse.  F/u with pcp.   Reevaluation:  After the interventions noted above, I reevaluated the patient and found that they have :improved   Social Determinants of Health:  Lives at home   Dispostion:  After consideration of the diagnostic results and the patients response to treatment, I feel that the patent would benefit from discharge with outpatient f/u.          Final Clinical Impression(s) / ED Diagnoses Final diagnoses:  Spinal stenosis of cervical region    Rx / DC Orders ED Discharge Orders          Ordered    For home use only DME 4 wheeled rolling walker with seat        03/02/22 2315    Cane adjustable wide base quad        03/02/22 2315              Jacalyn Lefevre, MD 03/02/22 2318

## 2022-03-02 NOTE — ED Notes (Signed)
Patient transported to MRI 

## 2022-03-03 NOTE — ED Notes (Signed)
All discharge instructions reviewed with patient including follow up care and prescriptions. Patient verbalized understanding of same and had no other questions. Patient verbalized to this RN that he will set up a lyft ride home. Wheel chair provided to patient prior to leaving the department.

## 2022-03-03 NOTE — Care Management (Signed)
Orders noted for RW 4 wheels and cane. Called [employer], they can provide one of the other with insurance. The patients information was given to them for RW, they will contact patient, and drop ship to home

## 2022-03-12 ENCOUNTER — Encounter (HOSPITAL_COMMUNITY): Payer: No Typology Code available for payment source

## 2022-03-12 DIAGNOSIS — H2521 Age-related cataract, morgagnian type, right eye: Secondary | ICD-10-CM | POA: Diagnosis not present

## 2022-03-13 DIAGNOSIS — G959 Disease of spinal cord, unspecified: Secondary | ICD-10-CM | POA: Diagnosis not present

## 2022-03-13 DIAGNOSIS — Z6841 Body Mass Index (BMI) 40.0 and over, adult: Secondary | ICD-10-CM | POA: Diagnosis not present

## 2022-03-14 ENCOUNTER — Encounter (HOSPITAL_COMMUNITY)
Admission: RE | Admit: 2022-03-14 | Discharge: 2022-03-14 | Disposition: A | Discharge status: Home / Self Care | Payer: No Typology Code available for payment source | Source: Ambulatory Visit | Attending: Ophthalmology | Admitting: Ophthalmology

## 2022-03-14 ENCOUNTER — Encounter (HOSPITAL_COMMUNITY): Payer: Self-pay

## 2022-03-14 HISTORY — DX: Personal history of other diseases of the circulatory system: Z86.79

## 2022-03-14 HISTORY — DX: Ventricular premature depolarization: I49.3

## 2022-03-14 HISTORY — DX: Other complications of anesthesia, initial encounter: T88.59XA

## 2022-03-14 HISTORY — DX: Cardiac arrhythmia, unspecified: I49.9

## 2022-03-15 ENCOUNTER — Encounter (HOSPITAL_COMMUNITY): Payer: Self-pay

## 2022-03-15 ENCOUNTER — Telehealth: Payer: Self-pay

## 2022-03-15 ENCOUNTER — Other Ambulatory Visit (HOSPITAL_COMMUNITY): Payer: No Typology Code available for payment source

## 2022-03-15 NOTE — H&P (Signed)
Surgical History & Physical  Patient Name: Bryan Blair DOB: 1956-03-27  Surgery: Cataract extraction with intraocular lens implant phacoemulsification; Right Eye  Surgeon: [name] MD Surgery Date:  03-19-22 Pre-Op Date:  03-12-22  HPI: A 2 Yr. old male patient presents for a Cataract Evaluation. The patient complains of difficulty when recognizing people, which began many years ago. Both eyes are affected, OD is worse. The episode is constant and gradual. The condition's severity decreased in the last 8 to 10 months The condition is worse with daily activities. The complaint is associated with blurry vision, the patient has not had good vision his whole life, but now it seems that the patient is looking through a brown smoke OD. The patient states when it is dark, he cannot see anything at all. This is negatively affecting the patient's quality of life and the patient is unable to function adequately in life with the current level of vision.The patient experiences no eye pain and no flashes, floater, shadow, curtain or veil.  Medical History: Cataracts Diabetic Retinopathy Diabetes Heart Problem High Blood Pressure LDL Lung Problems  Review of Systems Negative Allergic/Immunologic Negative Cardiovascular Negative Constitutional Negative Ear, Nose, Mouth & Throat Negative Endocrine Negative Eyes Negative Gastrointestinal Negative Genitourinary Negative Hemotologic/Lymphatic Negative Integumentary Negative Musculoskeletal Negative Neurological Negative Psychiatry Negative Respiratory  Social   Current every day smoker   Medication Tamsulosin, Glipizide, Actos, Norvasc, Rosuvastatin, Amlodipine, Xanax,   Sx/Procedures  Cervical fusion, Tonsilectomy, Adenoids Removed, Hernia surgery, Bilateral Knee Replacement, Heart Stents,   Drug Allergies  Iodine, Compazine, Supartz, BENADRYL, Bees,   History & Physical: Heent: Cataract, Right Eye NECK: supple without  bruits LUNGS: lungs clear to auscultation CV: regular rate and rhythm Abdomen: soft and non-tender  Impression & Plan: Assessment: 1.  CATARACT HYPERMATURE (MORGAGNIAN) AGE RELATED; Right Eye (H25.21) 2.  NUCLEAR SCLEROSIS AGE RELATED; Left Eye (H25.12) 3.  BLEPHARITIS; Right Upper Lid, Right Lower Lid, Left Upper Lid, Left Lower Lid (H01.001, H01.002,H01.004,H01.005)  Plan: 1.  Cataract accounts for the patient's decreased vision. This visual impairment is not correctable with a tolerable change in glasses or contact lenses. Cataract surgery with an implantation of a new lens should significantly improve the visual and functional status of the patient. Discussed all risks, benefits, alternatives, and potential complications. Discussed the procedures and recovery. Patient desires to have surgery. A-scan ordered and performed today for intra-ocular lens calculations. The surgery will be performed in order to improve vision for driving, reading, and for eye examinations. Recommend phacoemulsification with intra-ocular lens. Recommend Dextenza for post-operative pain and inflammation. Right Eye. Dilates poorly - shugacaine by protocol. [employer]. Malyugin Ring. Omidira. General anesthesia.  2.  Will consider surgery after right eye.  3.  Recommend regular lid cleaning.

## 2022-03-15 NOTE — Telephone Encounter (Signed)
Call patched through from [facility] about Bryan Blair order. They did not  have the diagnosis or reason for walker, therefore they would need qan updated order. Patient was DC from [facility] several days ago, provided information for them to get in touch with PCP =Bryan Blair

## 2022-03-19 ENCOUNTER — Encounter (HOSPITAL_COMMUNITY)
Admission: RE | Disposition: A | Discharge status: Home / Self Care | Payer: Self-pay | Source: Home / Self Care | Attending: Ophthalmology

## 2022-03-19 ENCOUNTER — Ambulatory Visit (HOSPITAL_BASED_OUTPATIENT_CLINIC_OR_DEPARTMENT_OTHER): Payer: No Typology Code available for payment source | Admitting: Anesthesiology

## 2022-03-19 ENCOUNTER — Ambulatory Visit (HOSPITAL_COMMUNITY)
Admission: RE | Admit: 2022-03-19 | Discharge: 2022-03-19 | Disposition: A | Discharge status: Home / Self Care | Payer: No Typology Code available for payment source | Attending: Ophthalmology | Admitting: Ophthalmology

## 2022-03-19 ENCOUNTER — Ambulatory Visit (HOSPITAL_COMMUNITY): Payer: No Typology Code available for payment source | Admitting: Anesthesiology

## 2022-03-19 ENCOUNTER — Encounter (HOSPITAL_COMMUNITY): Payer: Self-pay | Admitting: Ophthalmology

## 2022-03-19 DIAGNOSIS — H2521 Age-related cataract, morgagnian type, right eye: Secondary | ICD-10-CM | POA: Diagnosis not present

## 2022-03-19 DIAGNOSIS — F17210 Nicotine dependence, cigarettes, uncomplicated: Secondary | ICD-10-CM

## 2022-03-19 DIAGNOSIS — H2512 Age-related nuclear cataract, left eye: Secondary | ICD-10-CM | POA: Diagnosis not present

## 2022-03-19 DIAGNOSIS — H0100A Unspecified blepharitis right eye, upper and lower eyelids: Secondary | ICD-10-CM | POA: Diagnosis not present

## 2022-03-19 DIAGNOSIS — F17200 Nicotine dependence, unspecified, uncomplicated: Secondary | ICD-10-CM | POA: Insufficient documentation

## 2022-03-19 DIAGNOSIS — H2181 Floppy iris syndrome: Secondary | ICD-10-CM | POA: Insufficient documentation

## 2022-03-19 DIAGNOSIS — E11319 Type 2 diabetes mellitus with unspecified diabetic retinopathy without macular edema: Secondary | ICD-10-CM | POA: Insufficient documentation

## 2022-03-19 DIAGNOSIS — I10 Essential (primary) hypertension: Secondary | ICD-10-CM | POA: Insufficient documentation

## 2022-03-19 DIAGNOSIS — I2510 Atherosclerotic heart disease of native coronary artery without angina pectoris: Secondary | ICD-10-CM

## 2022-03-19 DIAGNOSIS — F419 Anxiety disorder, unspecified: Secondary | ICD-10-CM | POA: Insufficient documentation

## 2022-03-19 DIAGNOSIS — E1136 Type 2 diabetes mellitus with diabetic cataract: Secondary | ICD-10-CM | POA: Insufficient documentation

## 2022-03-19 DIAGNOSIS — I252 Old myocardial infarction: Secondary | ICD-10-CM | POA: Insufficient documentation

## 2022-03-19 DIAGNOSIS — H0100B Unspecified blepharitis left eye, upper and lower eyelids: Secondary | ICD-10-CM | POA: Insufficient documentation

## 2022-03-19 HISTORY — PX: CATARACT EXTRACTION W/PHACO: SHX586

## 2022-03-19 LAB — GLUCOSE, CAPILLARY: Glucose-Capillary: 96 mg/dL (ref 70–99)

## 2022-03-19 SURGERY — PHACOEMULSIFICATION, CATARACT, WITH IOL INSERTION
Anesthesia: General | Site: Eye | Laterality: Right

## 2022-03-19 MED ORDER — LIDOCAINE HCL 3.5 % OP GEL
1.0000 "application " | Freq: Once | OPHTHALMIC | Status: AC
  Administered 2022-03-19: 1 via OPHTHALMIC

## 2022-03-19 MED ORDER — TRYPAN BLUE 0.06 % IO SOSY
PREFILLED_SYRINGE | INTRAOCULAR | Status: AC
  Filled 2022-03-19: qty 1

## 2022-03-19 MED ORDER — SUGAMMADEX SODIUM 200 MG/2ML IV SOLN
INTRAVENOUS | Status: DC | PRN
  Administered 2022-03-19: 200 mg via INTRAVENOUS

## 2022-03-19 MED ORDER — LIDOCAINE 2% (20 MG/ML) 5 ML SYRINGE
INTRAMUSCULAR | Status: DC | PRN
  Administered 2022-03-19: 100 mg via INTRAVENOUS

## 2022-03-19 MED ORDER — LIDOCAINE HCL (PF) 1 % IJ SOLN
INTRAOCULAR | Status: DC | PRN
  Administered 2022-03-19: 1 mL via OPHTHALMIC

## 2022-03-19 MED ORDER — TRYPAN BLUE 0.06 % IO SOSY
PREFILLED_SYRINGE | INTRAOCULAR | Status: DC | PRN
  Administered 2022-03-19: 0.5 mL via INTRAOCULAR

## 2022-03-19 MED ORDER — IPRATROPIUM-ALBUTEROL 0.5-2.5 (3) MG/3ML IN SOLN
3.0000 mL | Freq: Once | RESPIRATORY_TRACT | Status: AC
  Administered 2022-03-19: 3 mL via RESPIRATORY_TRACT

## 2022-03-19 MED ORDER — IPRATROPIUM-ALBUTEROL 0.5-2.5 (3) MG/3ML IN SOLN
RESPIRATORY_TRACT | Status: AC
  Filled 2022-03-19: qty 3

## 2022-03-19 MED ORDER — DEXMEDETOMIDINE HCL IN NACL 80 MCG/20ML IV SOLN
INTRAVENOUS | Status: AC
  Filled 2022-03-19: qty 20

## 2022-03-19 MED ORDER — MIDAZOLAM HCL 2 MG/2ML IJ SOLN
INTRAMUSCULAR | Status: AC
  Filled 2022-03-19: qty 2

## 2022-03-19 MED ORDER — NEOMYCIN-POLYMYXIN-DEXAMETH 3.5-10000-0.1 OP SUSP
OPHTHALMIC | Status: DC | PRN
  Administered 2022-03-19: 2 [drp] via OPHTHALMIC

## 2022-03-19 MED ORDER — TETRACAINE HCL 0.5 % OP SOLN
1.0000 [drp] | OPHTHALMIC | Status: AC | PRN
  Administered 2022-03-19 (×3): 1 [drp] via OPHTHALMIC

## 2022-03-19 MED ORDER — POVIDONE-IODINE 5 % OP SOLN
OPHTHALMIC | Status: DC | PRN
  Administered 2022-03-19: 1 via OPHTHALMIC

## 2022-03-19 MED ORDER — FENTANYL CITRATE PF 50 MCG/ML IJ SOSY: 25.0000 ug | PREFILLED_SYRINGE | INTRAMUSCULAR | Status: DC | PRN

## 2022-03-19 MED ORDER — LIDOCAINE HCL (PF) 2 % IJ SOLN
INTRAMUSCULAR | Status: AC
  Filled 2022-03-19: qty 5

## 2022-03-19 MED ORDER — DEXAMETHASONE SODIUM PHOSPHATE 10 MG/ML IJ SOLN
INTRAMUSCULAR | Status: AC
  Filled 2022-03-19: qty 1

## 2022-03-19 MED ORDER — PHENYLEPHRINE HCL 2.5 % OP SOLN
1.0000 [drp] | OPHTHALMIC | Status: AC | PRN
  Administered 2022-03-19 (×3): 1 [drp] via OPHTHALMIC

## 2022-03-19 MED ORDER — ROCURONIUM BROMIDE 10 MG/ML (PF) SYRINGE
PREFILLED_SYRINGE | INTRAVENOUS | Status: AC
  Filled 2022-03-19: qty 10

## 2022-03-19 MED ORDER — ROCURONIUM BROMIDE 10 MG/ML (PF) SYRINGE
PREFILLED_SYRINGE | INTRAVENOUS | Status: DC | PRN
  Administered 2022-03-19: 50 mg via INTRAVENOUS

## 2022-03-19 MED ORDER — PHENYLEPHRINE-KETOROLAC 1-0.3 % IO SOLN
INTRAOCULAR | Status: AC
  Filled 2022-03-19 (×2): qty 4

## 2022-03-19 MED ORDER — FENTANYL CITRATE (PF) 100 MCG/2ML IJ SOLN
INTRAMUSCULAR | Status: AC
  Filled 2022-03-19: qty 2

## 2022-03-19 MED ORDER — PROPOFOL 10 MG/ML IV BOLUS
INTRAVENOUS | Status: AC
  Filled 2022-03-19: qty 20

## 2022-03-19 MED ORDER — SODIUM HYALURONATE 23MG/ML IO SOSY
PREFILLED_SYRINGE | INTRAOCULAR | Status: DC | PRN
  Administered 2022-03-19: 0.6 mL via INTRAOCULAR

## 2022-03-19 MED ORDER — SODIUM HYALURONATE 10 MG/ML IO SOLUTION
PREFILLED_SYRINGE | INTRAOCULAR | Status: DC | PRN
  Administered 2022-03-19: 0.85 mL via INTRAOCULAR

## 2022-03-19 MED ORDER — ONDANSETRON HCL 4 MG/2ML IJ SOLN
INTRAMUSCULAR | Status: AC
  Filled 2022-03-19: qty 2

## 2022-03-19 MED ORDER — IPRATROPIUM-ALBUTEROL 0.5-2.5 (3) MG/3ML IN SOLN: 3.0000 mL | RESPIRATORY_TRACT | Status: DC

## 2022-03-19 MED ORDER — TROPICAMIDE 1 % OP SOLN
1.0000 [drp] | OPHTHALMIC | Status: AC | PRN
  Administered 2022-03-19 (×3): 1 [drp] via OPHTHALMIC

## 2022-03-19 MED ORDER — PHENYLEPHRINE-KETOROLAC 1-0.3 % IO SOLN
INTRAOCULAR | Status: DC | PRN
  Administered 2022-03-19: 500 mL via OPHTHALMIC

## 2022-03-19 MED ORDER — ONDANSETRON HCL 4 MG/2ML IJ SOLN
INTRAMUSCULAR | Status: DC | PRN
  Administered 2022-03-19: 4 mg via INTRAVENOUS

## 2022-03-19 MED ORDER — STERILE WATER FOR IRRIGATION IR SOLN
Status: DC | PRN
  Administered 2022-03-19: 250 mL

## 2022-03-19 MED ORDER — MIDAZOLAM HCL 2 MG/2ML IJ SOLN
INTRAMUSCULAR | Status: DC | PRN
  Administered 2022-03-19: 2 mg via INTRAVENOUS

## 2022-03-19 MED ORDER — BSS IO SOLN
INTRAOCULAR | Status: DC | PRN
  Administered 2022-03-19: 15 mL via INTRAOCULAR

## 2022-03-19 MED ORDER — LACTATED RINGERS IV SOLN: INTRAVENOUS | Status: DC

## 2022-03-19 MED ORDER — ONDANSETRON HCL 4 MG/2ML IJ SOLN: 4.0000 mg | Freq: Once | INTRAMUSCULAR | Status: DC | PRN

## 2022-03-19 MED ORDER — PROPOFOL 10 MG/ML IV BOLUS
INTRAVENOUS | Status: DC | PRN
  Administered 2022-03-19: 200 mg via INTRAVENOUS

## 2022-03-19 MED ORDER — FENTANYL CITRATE (PF) 250 MCG/5ML IJ SOLN
INTRAMUSCULAR | Status: DC | PRN
Start: 2022-03-19 — End: 2022-03-19
  Administered 2022-03-19 (×2): 50 ug via INTRAVENOUS

## 2022-03-19 MED ORDER — EPINEPHRINE PF 1 MG/ML IJ SOLN
INTRAMUSCULAR | Status: AC
  Filled 2022-03-19: qty 2

## 2022-03-19 SURGICAL SUPPLY — 15 items
CATARACT SUITE SIGHTPATH (MISCELLANEOUS) ×2 IMPLANT
CLOTH BEACON ORANGE TIMEOUT ST (SAFETY) ×2 IMPLANT
EYE SHIELD UNIVERSAL CLEAR (GAUZE/BANDAGES/DRESSINGS) ×1 IMPLANT
FEE CATARACT SUITE SIGHTPATH (MISCELLANEOUS) ×1 IMPLANT
GLOVE BIOGEL PI IND STRL 7.0 (GLOVE) ×2 IMPLANT
GLOVE BIOGEL PI INDICATOR 7.0 (GLOVE) ×3
GOWN STRL REUS W/TWL LRG LVL3 (GOWN DISPOSABLE) ×1 IMPLANT
LENS IOL ACRSF EXPAND 0.0 (Intraocular Lens) IMPLANT
LENS IOL ACRYSOF EXPAND 0.0 (Intraocular Lens) ×2 IMPLANT
PAD ARMBOARD 7.5X6 YLW CONV (MISCELLANEOUS) ×2 IMPLANT
RING MALYGIN 7.0 (MISCELLANEOUS) ×1 IMPLANT
SYR TB 1ML LL NO SAFETY (SYRINGE) ×2 IMPLANT
TAPE SURG TRANSPORE 1 IN (GAUZE/BANDAGES/DRESSINGS) IMPLANT
TAPE SURGICAL TRANSPORE 1 IN (GAUZE/BANDAGES/DRESSINGS) ×1
WATER STERILE IRR 250ML POUR (IV SOLUTION) ×2 IMPLANT

## 2022-03-19 NOTE — Op Note (Signed)
Date of procedure: 03/19/22  Pre-operative diagnosis: Visually significant age-related mature cataract, Right Eye; Poor Dilation, Right Eye (H25.21)  Post-operative diagnosis: Visually significant age-related cataract, Right Eye; Intra-operative Floppy Iris Syndrome, Right Eye (H21.81)  Procedure: Removal of cataract via phacoemulsification and insertion of intra-ocular lens Alcon MA60MA +0.0D into the capsular bag of the Right Eye (CPT [phone])  Attending surgeon: Rudy Jew. Jairen Goldfarb, MD, MA  Anesthesia: General, Topical Akten  Complications: None  Estimated Blood Loss: <24mL (minimal)  Specimens: None  Implants: As above  Indications:  Visually significant cataract, Right Eye  Procedure:  The patient was seen and identified in the pre-operative area. The operative eye was identified and dilated.  The operative eye was marked.  Topical anesthesia was administered to the operative eye.     The patient was then to the operative suite and placed in the supine position. General anesthesia was administered.  A timeout was performed confirming the patient, procedure to be performed, and all other relevant information.   The patient's face was prepped and draped in the usual fashion for intra-ocular surgery.  A lid speculum was placed into the operative eye and the surgical microscope moved into place and focused.  Poor dilation of the iris was confirmed as well as a lack of a red reflex.  A superotemporal paracentesis was created using a 20 gauge paracentesis blade.  Vision Blue was used to stain the anterior capsule. Shugarcaine was injected into the anterior chamber.  Viscoelastic was injected into the anterior chamber.  A temporal clear-corneal main wound incision was created using a 2.51mm microkeratome.  A Malyugin ring was placed.  A continuous curvilinear capsulorrhexis was initiated using an irrigating cystitome and completed using capsulorrhexis forceps.  Hydrodissection and hydrodeliniation  were performed.  Viscoelastic was injected into the anterior chamber.  A phacoemulsification handpiece and a chopper as a second instrument were used to remove the nucleus and epinucleus. The irrigation/aspiration handpiece was used to remove any remaining cortical material.   The capsular bag was reinflated with viscoelastic, checked, and found to be intact.  The intraocular lens was inserted into the capsular bag and dialed into place using a Customer service manager.  The Malyugin ring was removed.  The irrigation/aspiration handpiece was used to remove any remaining viscoelastic.  The clear corneal wound and paracentesis wounds were then hydrated and checked with Weck-Cels to be watertight.  The lid-speculum and drape was removed, and the patient's face was cleaned with a wet and dry 4x4.  Maxitrol was instilled in the eye. A clear shield was taped over the eye. The patient was taken to the post-operative care unit in good condition, having tolerated the procedure well.  Post-Op Instructions: The patient will follow up at [facility] for a same day post-operative evaluation and will receive all other orders and instructions.

## 2022-03-19 NOTE — Discharge Instructions (Signed)
Please discharge patient when stable, will follow up today with Dr. Lenise Jr at the [facility] office immediately following discharge.  Leave shield in place until visit.  All paperwork with discharge instructions will be given at the office.  [facility] Address:  [address]  [place], [place] [address]  

## 2022-03-19 NOTE — Interval H&P Note (Signed)
History and Physical Interval Note:  03/19/2022 3:09 PM  Bryan Blair  has presented today for surgery, with the diagnosis of cataract hypermature age related; right.  The various methods of treatment have been discussed with the patient and family. After consideration of risks, benefits and other options for treatment, the patient has consented to  Procedure(s) with comments: CATARACT EXTRACTION PHACO AND INTRAOCULAR LENS PLACEMENT (IOC) (Right) - right as a surgical intervention.  The patient's history has been reviewed, patient examined, no change in status, stable for surgery.  I have reviewed the patient's chart and labs.  Questions were answered to the patient's satisfaction.     [name]

## 2022-03-19 NOTE — Transfer of Care (Signed)
Immediate Anesthesia Transfer of Care Note  Patient: Bryan Blair  Procedure(s) Performed: CATARACT EXTRACTION PHACO AND INTRAOCULAR LENS PLACEMENT (IOC) (Right: Eye)  Patient Location: PACU  Anesthesia Type:General  Level of Consciousness: oriented and drowsy  Airway & Oxygen Therapy: Patient Spontanous Breathing and Patient connected to face mask oxygen  Post-op Assessment: Report given to RN and Post -op Vital signs reviewed and stable  Post vital signs: Reviewed and stable  Last Vitals:  Vitals Value Taken Time  BP 173/78 03/19/22 1616  Temp 36.7 C 03/19/22 1615  Pulse 87 03/19/22 1617  Resp 15 03/19/22 1617  SpO2 95 % 03/19/22 1617  Vitals shown include unvalidated device data.  Last Pain:  Vitals:   03/19/22 1404  TempSrc: Oral  PainSc: 0-No pain         Complications: No notable events documented.

## 2022-03-19 NOTE — Anesthesia Procedure Notes (Signed)
Procedure Name: Intubation Date/Time: 03/19/2022 3:27 PM  Performed by: Orlie Dakin, CRNAPre-anesthesia Checklist: Patient identified, Emergency Drugs available, Suction available and Patient being monitored Patient Re-evaluated:Patient Re-evaluated prior to induction Oxygen Delivery Method: Circle system utilized Preoxygenation: Pre-oxygenation with 100% oxygen Induction Type: IV induction Ventilation: Mask ventilation with difficulty and Oral airway inserted - appropriate to patient size Laryngoscope Size: Miller and 3 Grade View: Grade II Tube type: Oral Tube size: 7.0 mm Number of attempts: 1 Airway Equipment and Method: Stylet Placement Confirmation: positive ETCO2, ETT inserted through vocal cords under direct vision and breath sounds checked- equal and bilateral Secured at: 24 cm Tube secured with: Tape Dental Injury: Teeth and Oropharynx as per pre-operative assessment

## 2022-03-19 NOTE — Anesthesia Preprocedure Evaluation (Signed)
Anesthesia Evaluation  Patient identified by MRN, date of birth, ID band Patient awake    Reviewed: Allergy & Precautions, H&P , NPO status , Patient's Chart, lab work & pertinent test results, reviewed documented beta blocker date and time   Airway Mallampati: II  TM Distance: >3 FB Neck ROM: full    Dental no notable dental hx.    Pulmonary neg pulmonary ROS, Current Smoker,    Pulmonary exam normal breath sounds clear to auscultation       Cardiovascular Exercise Tolerance: Good hypertension, + CAD and + Past MI   Rhythm:regular Rate:Normal     Neuro/Psych PSYCHIATRIC DISORDERS Anxiety negative neurological ROS     GI/Hepatic negative GI ROS, Neg liver ROS,   Endo/Other  negative endocrine ROSdiabetes, Type 2  Renal/GU negative Renal ROS  negative genitourinary   Musculoskeletal   Abdominal   Peds  Hematology negative hematology ROS (+)   Anesthesia Other Findings   Reproductive/Obstetrics negative OB ROS                             Anesthesia Physical Anesthesia Plan  ASA: 3  Anesthesia Plan: General and General LMA   Post-op Pain Management:    Induction:   PONV Risk Score and Plan: Ondansetron  Airway Management Planned:   Additional Equipment:   Intra-op Plan:   Post-operative Plan:   Informed Consent: I have reviewed the patients History and Physical, chart, labs and discussed the procedure including the risks, benefits and alternatives for the proposed anesthesia with the patient or authorized representative who has indicated his/her understanding and acceptance.     Dental Advisory Given  Plan Discussed with: CRNA  Anesthesia Plan Comments:         Anesthesia Quick Evaluation

## 2022-03-20 ENCOUNTER — Telehealth: Payer: Self-pay | Admitting: Cardiology

## 2022-03-20 ENCOUNTER — Encounter (HOSPITAL_COMMUNITY): Payer: Self-pay | Admitting: Ophthalmology

## 2022-03-20 ENCOUNTER — Telehealth: Payer: Self-pay | Admitting: Internal Medicine

## 2022-03-20 NOTE — Telephone Encounter (Signed)
   Pre-operative Risk Assessment    Patient Name: Bryan Blair  DOB: Aug 03, 1956 MRN: [id number]      Request for Surgical Clearance    Procedure:   C3-4, C4-5, C5-6 Anterior Cervical Fusion  Date of Surgery:  Clearance TBD                                 Surgeon:  Dr. Dominica Severin P. Saintclair Halsted Surgeon's Group or Practice Name:  [facility] Phone number:    Fax number:  [phone]   Type of Clearance Requested:   - Medical    Type of Anesthesia:  General    Additional requests/questions:    Signed, Desma Paganini   03/20/2022, 4:37 PM

## 2022-03-20 NOTE — Telephone Encounter (Signed)
   Pre-operative Risk Assessment    Patient Name: Bryan Blair  DOB: 11-08-1955 MRN: [id number]      Request for Surgical Clearance    Procedure C3-4, C4-5, C5-6 Anterior Cervical Fusion   Date of Surgery:  Clearance TBD                                 Surgeon:  Julien Girt. Chevis Pretty Group or Practice Name:  [facility] Phone number:  [phone] Fax number:  [phone]   Type of Clearance Requested:   - Medical    Type of Anesthesia:  General    Additional requests/questions:    Bryan Blair   03/20/2022, 12:47 PM

## 2022-03-20 NOTE — Telephone Encounter (Signed)
Pt is needing to be cleared for surgery for neck surgery.  [facility].  Dr. Kary Kos, Brooke Bonito.  FaxHA:[phone] [phone]. Please advise.    Patient was last seen 11/07/2021.  Dr. Melvyn Novas please advise, how would you like to proceed?

## 2022-03-20 NOTE — Anesthesia Postprocedure Evaluation (Signed)
Anesthesia Post Note  Patient: Bryan Blair  Procedure(s) Performed: CATARACT EXTRACTION PHACO AND INTRAOCULAR LENS PLACEMENT (IOC) (Right: Eye)  Patient location during evaluation: Phase II Anesthesia Type: General Level of consciousness: awake Pain management: pain level controlled Vital Signs Assessment: post-procedure vital signs reviewed and stable Respiratory status: spontaneous breathing and respiratory function stable Cardiovascular status: blood pressure returned to baseline and stable Postop Assessment: no headache and no apparent nausea or vomiting Anesthetic complications: no Comments: Late entry   No notable events documented.   Last Vitals:  Vitals:   03/19/22 1643 03/19/22 1648  BP:    Pulse:    Resp: 19   Temp:    SpO2:  91%    Last Pain:  Vitals:   03/19/22 1615  TempSrc:   PainSc: 0-No pain                 Louann Sjogren

## 2022-03-20 NOTE — Telephone Encounter (Signed)
Pt states that he has an up coming procedure and needs a Clearance faxed sent over to the office below. Pt would like a call back if he needs to be seen prior to procedure. Please advise  Dr. Channing [facility] Tele [phone] Fax - [phone]

## 2022-03-21 NOTE — Telephone Encounter (Signed)
Pt unable to come for OV due to being legally blind, requested phone visit but made pt aware we aren't doing televisits, suggested mychart visit would need approval.  Please advise.   Dr. Sherene Sires please advise

## 2022-03-21 NOTE — Telephone Encounter (Signed)
Would need preop eval with all meds in hand - ok to add on to pm schedule next week any day but Wednesday

## 2022-03-21 NOTE — Telephone Encounter (Signed)
Primary Cardiologist:Bryan Diona Browner, MD  Chart reviewed as part of pre-operative protocol coverage. Because of Bryan Blair past medical history and time since last visit, he/she will require a virtual visit/telephone call in order to better assess preoperative cardiovascular risk.  Pre-op covering staff: - Please contact patient, obtain consent, and schedule appointment   There is no request to hold aspirin.   [name], NP-C    03/21/2022, 11:14 AM [facility] [address], [address] Office [phone] Fax [phone]

## 2022-03-21 NOTE — Telephone Encounter (Signed)
This is a duplicate encounter, therefore I am closing it.

## 2022-03-21 NOTE — Telephone Encounter (Signed)
Pt unable to come for OV due to being legally blind, requested phone visit but made pt aware we aren't doing televisits, suggested mychart visit would need approval.  Please advise.

## 2022-03-21 NOTE — Telephone Encounter (Signed)
ATC patient. LMTCB was going to offer next Friday pm in [place].

## 2022-03-22 ENCOUNTER — Telehealth: Payer: Self-pay | Admitting: Internal Medicine

## 2022-03-22 NOTE — Telephone Encounter (Signed)
Please advise on doing phone or video visit with patient

## 2022-03-22 NOTE — Telephone Encounter (Signed)
ERROR

## 2022-03-22 NOTE — Telephone Encounter (Signed)
We could try a video visit if can't get to office

## 2022-03-23 ENCOUNTER — Telehealth: Payer: Self-pay | Admitting: *Deleted

## 2022-03-23 NOTE — Telephone Encounter (Signed)
Pt agreeable to plan of care for tele pre op appt 03/28/22 @ 9 am. Med rec and consent are done.

## 2022-03-23 NOTE — Telephone Encounter (Signed)
Left message for the pt to call the office to set up a tele pre op appt.  ?

## 2022-03-23 NOTE — Telephone Encounter (Signed)
Called and talked to patient he states he has already been scheduled. Was scheduled with Rhunette Croft NP for Monday 6/19 Mychart video visit for surg clearance. Dr. Sherene Sires is this okay with you?

## 2022-03-23 NOTE — Telephone Encounter (Signed)
Pt agreeable to plan of care for tele pre op appt 03/28/22 @ 9 am. Med rec and consent are done.    Patient Consent for Virtual Visit        Bryan Blair has provided verbal consent on 03/23/2022 for a virtual visit (video or telephone).   CONSENT FOR VIRTUAL VISIT FOR:  Bryan Blair  By participating in this virtual visit I agree to the following:  I hereby voluntarily request, consent and authorize [facility] and its employed or contracted physicians, physician assistants, nurse practitioners or other licensed health care professionals (the Practitioner), to provide me with telemedicine health care services (the "Services") as deemed necessary by the treating Practitioner. I acknowledge and consent to receive the Services by the Practitioner via telemedicine. I understand that the telemedicine visit will involve communicating with the Practitioner through live audiovisual communication technology and the disclosure of certain medical information by electronic transmission. I acknowledge that I have been given the opportunity to request an in-person assessment or other available alternative prior to the telemedicine visit and am voluntarily participating in the telemedicine visit.  I understand that I have the right to withhold or withdraw my consent to the use of telemedicine in the course of my care at any time, without affecting my right to future care or treatment, and that the Practitioner or I may terminate the telemedicine visit at any time. I understand that I have the right to inspect all information obtained and/or recorded in the course of the telemedicine visit and may receive copies of available information for a reasonable fee.  I understand that some of the potential risks of receiving the Services via telemedicine include:  Delay or interruption in medical evaluation due to technological equipment failure or disruption; Information transmitted may not be sufficient (e.g.  poor resolution of images) to allow for appropriate medical decision making by the Practitioner; and/or  In rare instances, security protocols could fail, causing a breach of personal health information.  Furthermore, I acknowledge that it is my responsibility to provide information about my medical history, conditions and care that is complete and accurate to the best of my ability. I acknowledge that Practitioner's advice, recommendations, and/or decision may be based on factors not within their control, such as incomplete or inaccurate data provided by me or distortions of diagnostic images or specimens that may result from electronic transmissions. I understand that the practice of medicine is not an exact science and that Practitioner makes no warranties or guarantees regarding treatment outcomes. I acknowledge that a copy of this consent can be made available to me via my patient portal [facility] MyChart), or I can request a printed copy by calling the office of [facility].    I understand that my insurance will be billed for this visit.   I have read or had this consent read to me. I understand the contents of this consent, which adequately explains the benefits and risks of the Services being provided via telemedicine.  I have been provided ample opportunity to ask questions regarding this consent and the Services and have had my questions answered to my satisfaction. I give my informed consent for the services to be provided through the use of telemedicine in my medical care

## 2022-03-26 ENCOUNTER — Telehealth (INDEPENDENT_AMBULATORY_CARE_PROVIDER_SITE_OTHER): Payer: No Typology Code available for payment source | Admitting: Nurse Practitioner

## 2022-03-26 ENCOUNTER — Encounter: Payer: Self-pay | Admitting: Nurse Practitioner

## 2022-03-26 DIAGNOSIS — J449 Chronic obstructive pulmonary disease, unspecified: Secondary | ICD-10-CM

## 2022-03-26 DIAGNOSIS — I252 Old myocardial infarction: Secondary | ICD-10-CM | POA: Diagnosis not present

## 2022-03-26 DIAGNOSIS — Z6841 Body Mass Index (BMI) 40.0 and over, adult: Secondary | ICD-10-CM | POA: Diagnosis not present

## 2022-03-26 DIAGNOSIS — Z01818 Encounter for other preprocedural examination: Secondary | ICD-10-CM | POA: Diagnosis not present

## 2022-03-26 DIAGNOSIS — F17210 Nicotine dependence, cigarettes, uncomplicated: Secondary | ICD-10-CM

## 2022-03-26 DIAGNOSIS — E119 Type 2 diabetes mellitus without complications: Secondary | ICD-10-CM | POA: Diagnosis not present

## 2022-03-26 DIAGNOSIS — R29898 Other symptoms and signs involving the musculoskeletal system: Secondary | ICD-10-CM | POA: Diagnosis not present

## 2022-03-26 DIAGNOSIS — Z9889 Other specified postprocedural states: Secondary | ICD-10-CM | POA: Diagnosis not present

## 2022-03-26 DIAGNOSIS — M542 Cervicalgia: Secondary | ICD-10-CM | POA: Diagnosis not present

## 2022-03-26 NOTE — Assessment & Plan Note (Signed)
Factors that increase the risk for postoperative pulmonary complications are age, obesity, current smoker, asthma, untreated OSA. High risk.  Respiratory complications generally occur in 1% of ASA Class I patients, 5% of ASA Class II and 10% of ASA Class III-IV patients These complications rarely result in mortality and include postoperative pneumonia, atelectasis, pulmonary embolism, ARDS and increased time requiring postoperative mechanical ventilation.   Overall, I recommend proceeding with the surgery if the risk for respiratory complications are outweighed by the potential benefits. This will need to be discussed between the patient and surgeon.   To reduce risks of respiratory complications, I recommend: --Pre- and post-operative incentive spirometry performed frequently while awake --Inpatient use of currently prescribed inhalers --Short duration of surgery as much as possible and avoid paralytic if possible --OOB, encourage mobility post-op, DVT prophylaxis    1) RISK FOR PROLONGED MECHANICAL VENTILAION - > 48h  1A) A[place] - Prolonged mech ventilation risk [place] Postperative Pulmonary Risk Score - for mech ventilation dependence >48h USAA, Ann Surg 2000, major non-cardiac surgery) Comment Score  Type of surgery - abd ao aneurysm (27), thoracic (21), neurosurgery / upper abdominal / vascular (21), neck (11) Cervical spine surgery 11  Emergency Surgery - (11)  0  ALbumin < 3 or poor nutritional state - (9)  0  BUN > 30 -  (8)  0  Partial or completely dependent functional status - (7)  7  COPD -  (6)  6  Age - 60 to 69 (4), > 70  (6)  4  TOTAL  28  Risk Stratifcation scores  - < 10 (0.5%), 11-19 (1.8%), 20-27 (4.2%), 28-40 (10.1%), >40 (26.6%)  10.1%

## 2022-03-26 NOTE — Progress Notes (Signed)
Patient ID: Bryan Blair, male     DOB: [birth year], 66 y.o.      MRN: [id number]  Chief Complaint  Patient presents with   Follow-up    Eye surgery, back surgery     Virtual Visit via Video Note  I connected with Bryan Blair on 03/26/22 at  3:00 PM EDT by a video enabled telemedicine application and verified that I am speaking with the correct person using two identifiers.  Location: Patient: Home Provider: Visit    I discussed the limitations of evaluation and management by telemedicine and the availability of in person appointments. The patient expressed understanding and agreed to proceed.  History of Present Illness: 66 year old male active smoker followed for asthmatic bronchitis and DOE.  He is a patient of Dr. Thurston Hole and last seen in office on 11/07/2021.  Past medical history significant for CAD, hypertension, history of STEMI, OSA intolerant of CPAP, DM 2, neuropathy, chronic neck pain, legally blind, morbid obesity, HLD, anxiety.  11/07/2021: OV with Dr. Sherene Sires to reestablish care.  Has a chronic smoker's cough with slight increase over the last 2.5 weeks-treated for acute bronchitis with Z-Pak.  No obstruction on former PFTs in 2015 but had a positive response to Trelegy.  Concern he was possibly having adverse effects of DPI-advised trial off of Trelegy and on Breztri for a week to see if this makes a difference.  If not can return to Trelegy.  Did have bilateral lower extremity edema on exam but BNP was normal; could be side effect of Actos but defer to PCP or cards for further assessment.  Counseled on smoking cessation.   03/26/2022: Today-surgical clearance Patient presents today via virtual visit for surgical clearance prior to cervical neck surgery with Dr. Wynetta Emery.  He reports that his breathing has been stable since he was seen last.  He still has a chronic, daily productive cough which is unchanged from his baseline.  He did switch back to Trelegy after last OV due to  not noticing much of a difference with Breztri.  Feels like this has controlled his breathing well.  He denies any recent flares requiring prednisone or antibiotics.  Denies any hemoptysis, worsening lower extremity edema, recent fevers, night sweats, wheezing.  He does live a relatively sedentary lifestyle due to progressive pain and neuropathy in his lower legs from his cervical compression.  Hopeful that this surgery will give him a better quality of life and allow him to be a bit more active.  He does continue to smoke; however is hoping to quit soon.  He was advised that he would be started on nicotine replacement therapy while in the hospital after his procedure and is hopeful that this will help him do so.  Allergies  Allergen Reactions   Iodine Anaphylaxis   Shellfish Allergy Anaphylaxis   Sodium Hyaluronate (Avian) Swelling   Prochlorperazine Maleate Other (See Comments)    Makes hyperactive   Benadryl [Diphenhydramine] Anxiety   Venomil Honey Bee Venom [Honey Bee Venom] Other (See Comments)    Causes blood poisoning    Immunization History  Administered Date(s) Administered   Fluad Quad(high Dose 65+) 07/07/2021   Influenza, Seasonal, Injecte, Preservative Fre 08/28/2013, 07/29/2014   Influenza,inj,Quad PF,6+ Mos 07/22/2015, 07/10/2016, 10/11/2017, 07/09/2018   Influenza-Unspecified 08/28/2013, 07/29/2014, 07/22/2015, 07/10/2016, 10/11/2017, 07/09/2018   Pneumococcal Conjugate-13 08/28/2013   Pneumococcal Polysaccharide-23 09/10/2014   Tdap 07/22/2015   Past Medical History:  Diagnosis Date   Anxiety    BPH (benign prostatic hyperplasia)  Complication of anesthesia    difficult to get patient to sleep   COPD (chronic obstructive pulmonary disease) (HCC)    Coronary artery disease    Angioplasty LAD 1995, angioplasty circumflex 1996, stent intervention circumflex 2000   Dysrhythmia    Essential hypertension    H/O unstable angina    Hyperlipidemia    Legally blind     Neuropathy    PVC (premature ventricular contraction)    Sleep apnea    STEMI (ST elevation myocardial infarction) (HCC) 2000   Type 2 diabetes mellitus (HCC)     Tobacco History: Social History   Tobacco Use  Smoking Status Every Day   Types: Cigarettes  Smokeless Tobacco Former   Ready to quit: Not Answered Counseling given: Not Answered   Outpatient Medications Prior to Visit  Medication Sig Dispense Refill   albuterol (VENTOLIN HFA) 108 (90 Base) MCG/ACT inhaler albuterol sulfate HFA 90 mcg/actuation aerosol inhaler     ALPRAZolam (XANAX) 0.5 MG tablet Take 0.5 mg by mouth 3 (three) times daily as needed for anxiety.     aspirin 81 MG tablet Take 81 mg by mouth 2 (two) times daily.     famotidine (PEPCID) 20 MG tablet Take 20 mg by mouth daily as needed.     Fluticasone-Umeclidin-Vilant (TRELEGY ELLIPTA) 100-62.5-25 MCG/INH AEPB Inhale into the lungs daily.     furosemide (LASIX) 20 MG tablet Take 1 tablet (20 mg total) by mouth daily as needed for edema. 30 tablet 2   glipiZIDE (GLUCOTROL) 10 MG tablet Take 10 mg by mouth 2 (two) times daily before a meal.     guaiFENesin (MUCINEX) 600 MG 12 hr tablet Take 600 mg by mouth 2 (two) times daily.     ibuprofen (ADVIL) 200 MG tablet Take 200 mg by mouth every 8 (eight) hours as needed.     isosorbide mononitrate (IMDUR) 30 MG 24 hr tablet Take 30 mg by mouth daily.     losartan (COZAAR) 25 MG tablet Take 25 mg by mouth daily.     Multiple Vitamin (MULTIVITAMIN WITH MINERALS) TABS tablet Take 1 tablet by mouth daily.     nitroGLYCERIN (NITROSTAT) 0.4 MG SL tablet Place 0.4 mg under the tongue every 5 (five) minutes as needed for chest pain.     pioglitazone (ACTOS) 45 MG tablet Take 45 mg by mouth daily.     rosuvastatin (CRESTOR) 20 MG tablet Take 10 mg by mouth daily.     Sodium Sulfate-Mag Sulfate-KCl (SUTAB) [id number] MG TABS Take as Directed 24 tablet 0   tamsulosin (FLOMAX) 0.4 MG CAPS capsule Take 0.4 mg by mouth daily.      Budeson-Glycopyrrol-Formoterol (BREZTRI AEROSPHERE) 160-9-4.8 MCG/ACT AERO Inhale 2 puffs into the lungs in the morning and at bedtime. (Patient not taking: Reported on 03/26/2022) 10.7 g 0   No facility-administered medications prior to visit.     Review of Systems:   Constitutional: No weight loss or gain, night sweats, fevers, chills. + Chronic fatigue HEENT: No headaches, difficulty swallowing, tooth/dental problems, or sore throat. No sneezing, itching, ear ache, nasal congestion, or post nasal drip CV:  No chest pain, orthopnea, PND, swelling in lower extremities, anasarca, dizziness, palpitations, syncope Resp: +shortness of breath with exertion (baseline); chronic productive cough (unchanged). No excess mucus or change in color of mucus. No hemoptysis. No wheezing.  No chest wall deformity GI:  No heartburn, indigestion, abdominal pain, nausea, vomiting, diarrhea, change in bowel habits, loss of appetite, bloody stools.  GU:  No dysuria, change in color of urine, urgency or frequency.  No flank pain, no hematuria  Skin: No rash, lesions, ulcerations MSK:  No joint pain or swelling.  Neuro: No dizziness or lightheadedness. +Chronic back/neck pain; limited mobility Psych: No depression or anxiety. Mood stable.   Observations/Objective: Patient is pleasant, interactive; chronically ill-appearing; in no acute distress. A&Ox3. Resting comfortably at home. Unlabored breathing. Speech is clear and coherent with logical content.    Assessment and Plan: Asthmatic bronchitis , chronic (HCC) Compensated on current regimen.  Continue triple therapy with Trelegy and as needed albuterol.  Continue mucolytic therapy twice daily.  Pulmonary function testing was never completed after previous visit.  He would like to hold off on scheduling this given his upcoming procedure.  Will schedule at next OV.  Patient Instructions  Continue Trelegy 1 puff daily.  Brush tongue and rinse mouth  afterwards Continue Albuterol inhaler 2 puffs or 3 mL neb every 6 hours as needed for shortness of breath or wheezing. Notify if symptoms persist despite rescue inhaler/neb use. Continue Mucinex twice daily for chest congestion  Take all of your inhalers with you to surgery. Use incentive spirometer 10 times an hour while awake after procedure. Out of bed as soon as possible after procedure  Follow-up as scheduled with Dr. Sherene Sires. If symptoms do not improve or worsen, please contact office for sooner follow up or seek emergency care.     Cigarette smoker Smoking cessation strongly advised.  He is planning to try to quit after his upcoming surgery.  Encouraged him to notify us if he would like further help with this.  Recommended that he be enrolled in the lung cancer screening program-patient was agreeable to this so referral sent today.  Preoperative clearance Factors that increase the risk for postoperative pulmonary complications are age, obesity, current smoker, asthma, untreated OSA. High risk.  Respiratory complications generally occur in 1% of ASA Class I patients, 5% of ASA Class II and 10% of ASA Class III-IV patients These complications rarely result in mortality and include postoperative pneumonia, atelectasis, pulmonary embolism, ARDS and increased time requiring postoperative mechanical ventilation.   Overall, I recommend proceeding with the surgery if the risk for respiratory complications are outweighed by the potential benefits. This will need to be discussed between the patient and surgeon.   To reduce risks of respiratory complications, I recommend: --Pre- and post-operative incentive spirometry performed frequently while awake --Inpatient use of currently prescribed inhalers --Short duration of surgery as much as possible and avoid paralytic if possible --OOB, encourage mobility post-op, DVT prophylaxis    1) RISK FOR PROLONGED MECHANICAL VENTILAION - > 48h  1A)  [place]roz[place] - Prolonged mech ventilation risk [place]roz[place] Postperative Pulmonary Risk Score - for mech ventilation dependence >48h USAA, Ann Surg 2000, major non-cardiac surgery) Comment Score  Type of surgery - abd ao aneurysm (27), thoracic (21), neurosurgery / upper abdominal / vascular (21), neck (11) Cervical spine surgery 11  Emergency Surgery - (11)  0  ALbumin < 3 or poor nutritional state - (9)  0  BUN > 30 -  (8)  0  Partial or completely dependent functional status - (7)  7  COPD -  (6)  6  Age - 60 to 69 (4), > 70  (6)  4  TOTAL  28  Risk Stratifcation scores  - < 10 (0.5%), 11-19 (1.8%), 20-27 (4.2%), 28-40 (10.1%), >40 (26.6%)  10.1%      I discussed the assessment and  treatment plan with the patient. The patient was provided an opportunity to ask questions and all were answered. The patient agreed with the plan and demonstrated an understanding of the instructions.   The patient was advised to call back or seek an in-person evaluation if the symptoms worsen or if the condition fails to improve as anticipated.  I provided 25 minutes of non-face-to-face time during this encounter.   Noemi Chapel, NP

## 2022-03-26 NOTE — Patient Instructions (Signed)
Continue Trelegy 1 puff daily.  Brush tongue and rinse mouth afterwards Continue Albuterol inhaler 2 puffs or 3 mL neb every 6 hours as needed for shortness of breath or wheezing. Notify if symptoms persist despite rescue inhaler/neb use. Continue Mucinex twice daily for chest congestion  Take all of your inhalers with you to surgery. Use incentive spirometer 10 times an hour while awake after procedure. Out of bed as soon as possible after procedure  Follow-up as scheduled with Dr. Sherene Sires. If symptoms do not improve or worsen, please contact office for sooner follow up or seek emergency care.

## 2022-03-26 NOTE — Telephone Encounter (Signed)
Patient needs surgical clearance if he did video visit with you today, please advise

## 2022-03-26 NOTE — Assessment & Plan Note (Signed)
Smoking cessation strongly advised.  He is planning to try to quit after his upcoming surgery.  Encouraged him to notify us if he would like further help with this.  Recommended that he be enrolled in the lung cancer screening program-patient was agreeable to this so referral sent today.

## 2022-03-26 NOTE — Telephone Encounter (Signed)
ok 

## 2022-03-26 NOTE — Assessment & Plan Note (Signed)
Compensated on current regimen.  Continue triple therapy with Trelegy and as needed albuterol.  Continue mucolytic therapy twice daily.  Pulmonary function testing was never completed after previous visit.  He would like to hold off on scheduling this given his upcoming procedure.  Will schedule at next OV.  Patient Instructions  Continue Trelegy 1 puff daily.  Brush tongue and rinse mouth afterwards Continue Albuterol inhaler 2 puffs or 3 mL neb every 6 hours as needed for shortness of breath or wheezing. Notify if symptoms persist despite rescue inhaler/neb use. Continue Mucinex twice daily for chest congestion  Take all of your inhalers with you to surgery. Use incentive spirometer 10 times an hour while awake after procedure. Out of bed as soon as possible after procedure  Follow-up as scheduled with Dr. Sherene Sires. If symptoms do not improve or worsen, please contact office for sooner follow up or seek emergency care.

## 2022-03-27 ENCOUNTER — Encounter (HOSPITAL_COMMUNITY)
Admission: RE | Admit: 2022-03-27 | Discharge: 2022-03-27 | Disposition: A | Discharge status: Home / Self Care | Payer: No Typology Code available for payment source | Source: Ambulatory Visit | Attending: Ophthalmology | Admitting: Ophthalmology

## 2022-03-27 NOTE — Telephone Encounter (Signed)
Completed yesterday. Thanks!

## 2022-03-28 ENCOUNTER — Ambulatory Visit (INDEPENDENT_AMBULATORY_CARE_PROVIDER_SITE_OTHER): Payer: No Typology Code available for payment source | Admitting: Physician Assistant

## 2022-03-28 DIAGNOSIS — Z01810 Encounter for preprocedural cardiovascular examination: Secondary | ICD-10-CM

## 2022-03-28 NOTE — Progress Notes (Signed)
Virtual Visit via Telephone Note   Because of Bryan Blair co-morbid illnesses, he is at least at moderate risk for complications without adequate follow up.  This format is felt to be most appropriate for this patient at this time.  The patient did not have access to video technology/had technical difficulties with video requiring transitioning to audio format only (telephone).  All issues noted in this document were discussed and addressed.  No physical exam could be performed with this format.  Please refer to the patient's chart for his consent to telehealth for [facility].  Evaluation Performed:  Preoperative cardiovascular risk assessment _____________   Date:  03/28/2022   Patient ID:  Bryan Blair, DOB Jun 05, 1956, MRN [id number] Patient Location:  Home Provider location:   Office  Primary Care Provider:  Dion Saucier, PA-C Primary Cardiologist:  Bryan Dell, MD  Chief Complaint / Patient Profile   66 y.o. y/o male with a h/o CAD s/p PCI to LAD 1995, PCI to LCx in 1996, stenting to LCx in 2000, with repeat angiography 01/2015 and 11/2017 showing patent stents, hypertension, hyperlipidemia, COPD, and OSA intolerant to CPAP who is pending C3-4, C4-5, C5-6 Anterior Cervical Fusion and presents today for telephonic preoperative cardiovascular risk assessment.  Past Medical History    Past Medical History:  Diagnosis Date   Anxiety    BPH (benign prostatic hyperplasia)    Complication of anesthesia    difficult to get patient to sleep   COPD (chronic obstructive pulmonary disease) (HCC)    Coronary artery disease    Angioplasty LAD 1995, angioplasty circumflex 1996, stent intervention circumflex 2000   Dysrhythmia    Essential hypertension    H/O unstable angina    Hyperlipidemia    Legally blind    Neuropathy    PVC (premature ventricular contraction)    Sleep apnea    STEMI (ST elevation myocardial infarction) (HCC) 2000   Type 2 diabetes mellitus  (HCC)    Past Surgical History:  Procedure Laterality Date   BACK SURGERY     CARPAL TUNNEL RELEASE Left    CATARACT EXTRACTION W/PHACO Right 03/19/2022   Procedure: CATARACT EXTRACTION PHACO AND INTRAOCULAR LENS PLACEMENT (IOC);  Surgeon: Bryan Pierce, MD;  Location: [facility];  Service: Ophthalmology;  Laterality: Right;  CDE: 35.65   HAND SURGERY     HERNIA REPAIR     JOINT REPLACEMENT     TONSILLECTOMY     ULNAR NERVE REPAIR      Allergies  Allergies  Allergen Reactions   Iodine Anaphylaxis   Shellfish Allergy Anaphylaxis   Sodium Hyaluronate (Avian) Swelling   Prochlorperazine Maleate Other (See Comments)    Makes hyperactive   Benadryl [Diphenhydramine] Anxiety   Venomil Honey Bee Venom [Honey Bee Venom] Other (See Comments)    Causes blood poisoning     History of Present Illness    Bryan Blair is a 66 y.o. male who presents via audio/video conferencing for a telehealth visit today.  Pt was last seen in cardiology clinic on 02/02/2022 by Bryan Blair PAC.  At that time Bryan Blair was doing well.  The patient is now pending procedure as outlined above. He is legally blind.  He has baseline dyspnea on exertion felt multifactorial in the setting of deconditioning, COPD, untreated OSA, and ongoing tobacco abuse.  Since his last visit, he continues to have dyspnea, but has not worsened. He describes moderate house work and can walk 4 blocks round trip to [employer]. He  can do this without angina.    Home Medications    Prior to Admission medications   Medication Sig Start Date End Date Taking? Authorizing Provider  albuterol (VENTOLIN HFA) 108 (90 Base) MCG/ACT inhaler albuterol sulfate HFA 90 mcg/actuation aerosol inhaler 07/04/20   [provider]  ALPRAZolam Prudy Feeler) 0.5 MG tablet Take 0.5 mg by mouth 3 (three) times daily as needed for anxiety.    [provider]  aspirin 81 MG tablet Take 81 mg by mouth 2 (two) times daily.    [provider]  famotidine (PEPCID) 20 MG tablet Take 20 mg by mouth daily as needed.    [provider]  Fluticasone-Umeclidin-Vilant (TRELEGY ELLIPTA) 100-62.5-25 MCG/INH AEPB Inhale into the lungs daily.    [provider]  furosemide (LASIX) 20 MG tablet Take 1 tablet (20 mg total) by mouth daily as needed for edema. 02/02/22   Blair, Bryan Pall, PA-C  glipiZIDE (GLUCOTROL) 10 MG tablet Take 10 mg by mouth 2 (two) times daily before a meal.    [provider]  guaiFENesin (MUCINEX) 600 MG 12 hr tablet Take 600 mg by mouth 2 (two) times daily.    [provider]  ibuprofen (ADVIL) 200 MG tablet Take 200 mg by mouth every 8 (eight) hours as needed.    [provider]  isosorbide mononitrate (IMDUR) 30 MG 24 hr tablet Take 30 mg by mouth daily.    [provider]  losartan (COZAAR) 25 MG tablet Take 25 mg by mouth daily. 10/19/20   [provider]  Multiple Vitamin (MULTIVITAMIN WITH MINERALS) TABS tablet Take 1 tablet by mouth daily.    [provider]  nitroGLYCERIN (NITROSTAT) 0.4 MG SL tablet Place 0.4 mg under the tongue every 5 (five) minutes as needed for chest pain.    [provider]  pioglitazone (ACTOS) 45 MG tablet Take 45 mg by mouth daily. 07/09/18   [provider]  rosuvastatin (CRESTOR) 20 MG tablet Take 10 mg by mouth daily. 10/19/20   [provider]  Sodium Sulfate-Mag Sulfate-KCl (SUTAB) [phone] MG TABS Take as Directed 10/24/21   Blair, Bryan Evans A, DO  tamsulosin (FLOMAX) 0.4 MG CAPS capsule Take 0.4 mg by mouth daily.    [provider]    Physical Exam    Vital Signs:  Bryan Blair does not have vital signs available for review today.  Given telephonic nature of communication, physical exam is limited. AAOx3. NAD. Normal affect.  Speech and respirations are unlabored.  Accessory Clinical Findings    None  Assessment & Plan    1.  Preoperative  Cardiovascular Risk Assessment:  He has a history of ischemic heart disease. He is fairly sedentary but does describe activity equivalent to 4.0 METS without angina. He has a 0.9% risk of MACE during the perioperative period. May hold ASA for spine surgery, resume after when safe to do so.   Therefore, based on ACC/AHA guidelines, the patient would be at acceptable risk for the planned procedure without further cardiovascular testing.   The patient was advised that if he develops new symptoms prior to surgery to contact our office to arrange for a follow-up visit, and he verbalized understanding.   A copy of this note will be routed to requesting surgeon.  Time:   Today, I have spent 10 minutes with the patient with telehealth technology discussing medical history, symptoms, and management plan.     Roe Rutherford Georgi Navarrete, PA  03/28/2022, 9:23 AM

## 2022-03-29 ENCOUNTER — Other Ambulatory Visit: Payer: Self-pay | Admitting: Neurosurgery

## 2022-04-02 ENCOUNTER — Ambulatory Visit (HOSPITAL_COMMUNITY)
Admission: RE | Admit: 2022-04-02 | Payer: No Typology Code available for payment source | Source: Home / Self Care | Admitting: Ophthalmology

## 2022-04-02 ENCOUNTER — Telehealth: Payer: No Typology Code available for payment source | Admitting: Internal Medicine

## 2022-04-02 ENCOUNTER — Encounter (HOSPITAL_COMMUNITY): Admission: RE | Payer: Self-pay | Source: Home / Self Care

## 2022-04-02 SURGERY — PHACOEMULSIFICATION, CATARACT, WITH IOL INSERTION
Anesthesia: General | Laterality: Left

## 2022-04-04 NOTE — Telephone Encounter (Signed)
Pt scheduled for surgery 04/23/22. Mandi, please advise if this has been sent over to the office about pt's surgical clearance.

## 2022-04-11 NOTE — Telephone Encounter (Signed)
OV notes and clearance form have been faxed back to [facility]. Nothing further needed at this time.  

## 2022-04-13 DIAGNOSIS — J449 Chronic obstructive pulmonary disease, unspecified: Secondary | ICD-10-CM | POA: Diagnosis not present

## 2022-04-13 DIAGNOSIS — H548 Legal blindness, as defined in USA: Secondary | ICD-10-CM | POA: Diagnosis not present

## 2022-04-20 ENCOUNTER — Other Ambulatory Visit (HOSPITAL_COMMUNITY)
Admission: RE | Admit: 2022-04-20 | Discharge: 2022-04-20 | Disposition: A | Discharge status: Home / Self Care | Payer: No Typology Code available for payment source | Source: Ambulatory Visit | Attending: Neurosurgery | Admitting: Neurosurgery

## 2022-04-20 ENCOUNTER — Encounter (HOSPITAL_COMMUNITY): Payer: Self-pay | Admitting: Neurosurgery

## 2022-04-20 ENCOUNTER — Other Ambulatory Visit: Payer: Self-pay

## 2022-04-20 DIAGNOSIS — Z01812 Encounter for preprocedural laboratory examination: Secondary | ICD-10-CM | POA: Insufficient documentation

## 2022-04-20 DIAGNOSIS — Z01818 Encounter for other preprocedural examination: Secondary | ICD-10-CM

## 2022-04-20 LAB — CBC WITH DIFFERENTIAL/PLATELET
Abs Immature Granulocytes: 0.06 10*3/uL (ref 0.00–0.07)
Basophils Absolute: 0.1 10*3/uL (ref 0.0–0.1)
Basophils Relative: 1 %
Eosinophils Absolute: 0.2 10*3/uL (ref 0.0–0.5)
Eosinophils Relative: 2 %
HCT: 44 % (ref 39.0–52.0)
Hemoglobin: 14.6 g/dL (ref 13.0–17.0)
Immature Granulocytes: 1 %
Lymphocytes Relative: 11 %
Lymphs Abs: 1.2 10*3/uL (ref 0.7–4.0)
MCH: 31.2 pg (ref 26.0–34.0)
MCHC: 33.2 g/dL (ref 30.0–36.0)
MCV: 94 fL (ref 80.0–100.0)
Monocytes Absolute: 0.9 10*3/uL (ref 0.1–1.0)
Monocytes Relative: 8 %
Neutro Abs: 8.3 10*3/uL — ABNORMAL HIGH (ref 1.7–7.7)
Neutrophils Relative %: 77 %
Platelets: 222 10*3/uL (ref 150–400)
RBC: 4.68 MIL/uL (ref 4.22–5.81)
RDW: 13.9 % (ref 11.5–15.5)
WBC: 10.7 10*3/uL — ABNORMAL HIGH (ref 4.0–10.5)
nRBC: 0 % (ref 0.0–0.2)

## 2022-04-20 LAB — BASIC METABOLIC PANEL
Anion gap: 9 (ref 5–15)
BUN: 12 mg/dL (ref 8–23)
CO2: 26 mmol/L (ref 22–32)
Calcium: 9.3 mg/dL (ref 8.9–10.3)
Chloride: 103 mmol/L (ref 98–111)
Creatinine, Ser: 1 mg/dL (ref 0.61–1.24)
GFR, Estimated: >60 mL/min (ref >60)
Glucose, Bld: 101 mg/dL — ABNORMAL HIGH (ref 70–99)
Potassium: 3.9 mmol/L (ref 3.5–5.1)
Sodium: 138 mmol/L (ref 135–145)

## 2022-04-20 LAB — PROTIME-INR
INR: 0.9 (ref 0.8–1.2)
Prothrombin Time: 12.5 seconds (ref 11.4–15.2)

## 2022-04-20 NOTE — Progress Notes (Signed)
Spoke with pt for pre-op call. Pt has hx of CAD with stents, MI, HTN, COPD and type 2 Diabetes. Pt's cardiologist is Dr. Diona Browner. Cardiac clearance in Epic dated 03/19/22. Pt denies any recent chest pain. States he has a chronic cough.  Pt's last A1C was 6.7 in April, 2023. He states he does not check his blood sugar because he has trouble picking up the strips. Instructed pt to hold his Glipizide Sunday evening and Monday AM. He will not take Actos Monday AM.   Pt is legally blind.   Shower instructions given to pt and he voiced understanding.

## 2022-04-23 ENCOUNTER — Encounter (HOSPITAL_COMMUNITY): Payer: Self-pay | Admitting: Neurosurgery

## 2022-04-23 ENCOUNTER — Inpatient Hospital Stay (HOSPITAL_COMMUNITY): Payer: No Typology Code available for payment source

## 2022-04-23 ENCOUNTER — Inpatient Hospital Stay (HOSPITAL_COMMUNITY)
Admission: RE | Admit: 2022-04-23 | Discharge: 2022-05-04 | DRG: 455 | Disposition: A | Discharge status: Skilled Nursing Facility | Payer: No Typology Code available for payment source | Attending: Neurosurgery | Admitting: Neurosurgery

## 2022-04-23 ENCOUNTER — Inpatient Hospital Stay (HOSPITAL_COMMUNITY): Payer: No Typology Code available for payment source | Admitting: Anesthesiology

## 2022-04-23 ENCOUNTER — Inpatient Hospital Stay (HOSPITAL_COMMUNITY)
Admission: RE | Disposition: A | Discharge status: Home / Self Care | Payer: Self-pay | Source: Home / Self Care | Attending: Neurosurgery

## 2022-04-23 ENCOUNTER — Other Ambulatory Visit: Payer: Self-pay

## 2022-04-23 DIAGNOSIS — M1990 Unspecified osteoarthritis, unspecified site: Secondary | ICD-10-CM | POA: Diagnosis not present

## 2022-04-23 DIAGNOSIS — F17210 Nicotine dependence, cigarettes, uncomplicated: Secondary | ICD-10-CM | POA: Diagnosis present

## 2022-04-23 DIAGNOSIS — Z8249 Family history of ischemic heart disease and other diseases of the circulatory system: Secondary | ICD-10-CM | POA: Diagnosis not present

## 2022-04-23 DIAGNOSIS — I10 Essential (primary) hypertension: Secondary | ICD-10-CM | POA: Diagnosis present

## 2022-04-23 DIAGNOSIS — Z981 Arthrodesis status: Secondary | ICD-10-CM | POA: Diagnosis not present

## 2022-04-23 DIAGNOSIS — G4730 Sleep apnea, unspecified: Secondary | ICD-10-CM | POA: Diagnosis present

## 2022-04-23 DIAGNOSIS — Z7951 Long term (current) use of inhaled steroids: Secondary | ICD-10-CM | POA: Diagnosis not present

## 2022-04-23 DIAGNOSIS — F419 Anxiety disorder, unspecified: Secondary | ICD-10-CM | POA: Diagnosis not present

## 2022-04-23 DIAGNOSIS — Z01818 Encounter for other preprocedural examination: Secondary | ICD-10-CM | POA: Diagnosis not present

## 2022-04-23 DIAGNOSIS — I2510 Atherosclerotic heart disease of native coronary artery without angina pectoris: Secondary | ICD-10-CM | POA: Diagnosis present

## 2022-04-23 DIAGNOSIS — Z7984 Long term (current) use of oral hypoglycemic drugs: Secondary | ICD-10-CM

## 2022-04-23 DIAGNOSIS — J449 Chronic obstructive pulmonary disease, unspecified: Secondary | ICD-10-CM | POA: Diagnosis not present

## 2022-04-23 DIAGNOSIS — Z8709 Personal history of other diseases of the respiratory system: Secondary | ICD-10-CM | POA: Diagnosis not present

## 2022-04-23 DIAGNOSIS — Z91030 Bee allergy status: Secondary | ICD-10-CM

## 2022-04-23 DIAGNOSIS — F17200 Nicotine dependence, unspecified, uncomplicated: Secondary | ICD-10-CM | POA: Diagnosis not present

## 2022-04-23 DIAGNOSIS — E785 Hyperlipidemia, unspecified: Secondary | ICD-10-CM | POA: Diagnosis present

## 2022-04-23 DIAGNOSIS — Z823 Family history of stroke: Secondary | ICD-10-CM | POA: Diagnosis not present

## 2022-04-23 DIAGNOSIS — R1310 Dysphagia, unspecified: Secondary | ICD-10-CM | POA: Diagnosis not present

## 2022-04-23 DIAGNOSIS — Z888 Allergy status to other drugs, medicaments and biological substances status: Secondary | ICD-10-CM

## 2022-04-23 DIAGNOSIS — R4702 Dysphasia: Secondary | ICD-10-CM | POA: Diagnosis not present

## 2022-04-23 DIAGNOSIS — H548 Legal blindness, as defined in USA: Secondary | ICD-10-CM | POA: Diagnosis present

## 2022-04-23 DIAGNOSIS — N400 Enlarged prostate without lower urinary tract symptoms: Secondary | ICD-10-CM | POA: Diagnosis not present

## 2022-04-23 DIAGNOSIS — M4712 Other spondylosis with myelopathy, cervical region: Secondary | ICD-10-CM | POA: Diagnosis not present

## 2022-04-23 DIAGNOSIS — M4802 Spinal stenosis, cervical region: Secondary | ICD-10-CM | POA: Diagnosis present

## 2022-04-23 DIAGNOSIS — E1140 Type 2 diabetes mellitus with diabetic neuropathy, unspecified: Secondary | ICD-10-CM | POA: Diagnosis present

## 2022-04-23 DIAGNOSIS — Z9889 Other specified postprocedural states: Secondary | ICD-10-CM

## 2022-04-23 DIAGNOSIS — Z4682 Encounter for fitting and adjustment of non-vascular catheter: Secondary | ICD-10-CM | POA: Diagnosis not present

## 2022-04-23 DIAGNOSIS — Z7982 Long term (current) use of aspirin: Secondary | ICD-10-CM | POA: Diagnosis not present

## 2022-04-23 DIAGNOSIS — Z79899 Other long term (current) drug therapy: Secondary | ICD-10-CM | POA: Diagnosis not present

## 2022-04-23 DIAGNOSIS — I252 Old myocardial infarction: Secondary | ICD-10-CM | POA: Diagnosis not present

## 2022-04-23 DIAGNOSIS — R600 Localized edema: Secondary | ICD-10-CM | POA: Diagnosis not present

## 2022-04-23 DIAGNOSIS — M4322 Fusion of spine, cervical region: Secondary | ICD-10-CM | POA: Diagnosis not present

## 2022-04-23 DIAGNOSIS — E119 Type 2 diabetes mellitus without complications: Secondary | ICD-10-CM | POA: Diagnosis not present

## 2022-04-23 DIAGNOSIS — G992 Myelopathy in diseases classified elsewhere: Principal | ICD-10-CM | POA: Diagnosis present

## 2022-04-23 DIAGNOSIS — G9520 Unspecified cord compression: Secondary | ICD-10-CM | POA: Diagnosis not present

## 2022-04-23 DIAGNOSIS — Z91013 Allergy to seafood: Secondary | ICD-10-CM

## 2022-04-23 HISTORY — PX: ANTERIOR CERVICAL DECOMP/DISCECTOMY FUSION: SHX1161

## 2022-04-23 HISTORY — DX: Unspecified osteoarthritis, unspecified site: M19.90

## 2022-04-23 HISTORY — DX: Myoneural disorder, unspecified: G70.9

## 2022-04-23 HISTORY — DX: Headache, unspecified: R51.9

## 2022-04-23 HISTORY — DX: Pneumonia, unspecified organism: J18.9

## 2022-04-23 LAB — GLUCOSE, CAPILLARY
Glucose-Capillary: 137 mg/dL — ABNORMAL HIGH (ref 70–99)
Glucose-Capillary: 124 mg/dL — ABNORMAL HIGH (ref 70–99)
Glucose-Capillary: 124 mg/dL — ABNORMAL HIGH (ref 70–99)
Glucose-Capillary: 123 mg/dL — ABNORMAL HIGH (ref 70–99)
Glucose-Capillary: 138 mg/dL — ABNORMAL HIGH (ref 70–99)
Glucose-Capillary: 158 mg/dL — ABNORMAL HIGH (ref 70–99)

## 2022-04-23 LAB — TYPE AND SCREEN
ABO/RH(D): A POS
Antibody Screen: NEGATIVE

## 2022-04-23 LAB — SURGICAL PCR SCREEN
MRSA, PCR: NEGATIVE
Staphylococcus aureus: NEGATIVE

## 2022-04-23 LAB — ABO/RH: ABO/RH(D): A POS

## 2022-04-23 SURGERY — ANTERIOR CERVICAL DECOMPRESSION/DISCECTOMY FUSION 3 LEVELS
Anesthesia: General | Site: Spine Cervical

## 2022-04-23 MED ORDER — LIDOCAINE 2% (20 MG/ML) 5 ML SYRINGE
INTRAMUSCULAR | Status: AC
Start: 2022-04-23 — End: ?
  Filled 2022-04-23: qty 5

## 2022-04-23 MED ORDER — ADULT MULTIVITAMIN W/MINERALS CH
1.0000 | ORAL_TABLET | Freq: Every day | ORAL | Status: DC
Start: 2022-04-24 — End: 2022-05-04
  Administered 2022-04-24 – 2022-05-04 (×11): 1 via ORAL
  Filled 2022-04-23 (×11): qty 1

## 2022-04-23 MED ORDER — SODIUM CHLORIDE 0.9 % IV SOLN: 250.0000 mL | INTRAVENOUS | Status: DC

## 2022-04-23 MED ORDER — THROMBIN 5000 UNITS EX SOLR
OROMUCOSAL | Status: DC | PRN
  Administered 2022-04-23 (×2): 5 mL via TOPICAL

## 2022-04-23 MED ORDER — HYDROMORPHONE HCL 1 MG/ML IJ SOLN: 0.5000 mg | INTRAMUSCULAR | Status: DC | PRN

## 2022-04-23 MED ORDER — LOSARTAN POTASSIUM 50 MG PO TABS
25.0000 mg | ORAL_TABLET | Freq: Every day | ORAL | Status: DC
  Administered 2022-04-24 – 2022-05-04 (×11): 25 mg via ORAL
  Filled 2022-04-23 (×11): qty 1

## 2022-04-23 MED ORDER — GUAIFENESIN ER 600 MG PO TB12
600.0000 mg | ORAL_TABLET | Freq: Two times a day (BID) | ORAL | Status: DC | PRN
  Administered 2022-04-25 – 2022-05-03 (×4): 600 mg via ORAL
  Filled 2022-04-23 (×6): qty 1

## 2022-04-23 MED ORDER — CEFAZOLIN SODIUM-DEXTROSE 2-4 GM/100ML-% IV SOLN
2.0000 g | Freq: Three times a day (TID) | INTRAVENOUS | Status: AC
  Administered 2022-04-23 – 2022-04-24 (×2): 2 g via INTRAVENOUS
  Filled 2022-04-23 (×2): qty 100

## 2022-04-23 MED ORDER — NITROGLYCERIN 0.4 MG SL SUBL: 0.4000 mg | SUBLINGUAL_TABLET | SUBLINGUAL | Status: DC | PRN

## 2022-04-23 MED ORDER — METHYLPREDNISOLONE 4 MG PO TBPK: ORAL_TABLET | ORAL | Status: DC

## 2022-04-23 MED ORDER — ONDANSETRON HCL 4 MG/2ML IJ SOLN: 4.0000 mg | Freq: Four times a day (QID) | INTRAMUSCULAR | Status: DC | PRN

## 2022-04-23 MED ORDER — THROMBIN 20000 UNITS EX SOLR
CUTANEOUS | Status: DC | PRN
  Administered 2022-04-23: 20 mL via TOPICAL

## 2022-04-23 MED ORDER — CHLORHEXIDINE GLUCONATE CLOTH 2 % EX PADS: 6.0000 | MEDICATED_PAD | Freq: Once | CUTANEOUS | Status: DC

## 2022-04-23 MED ORDER — ORAL CARE MOUTH RINSE: 15.0000 mL | Freq: Once | OROMUCOSAL | Status: AC

## 2022-04-23 MED ORDER — THROMBIN 5000 UNITS EX SOLR
CUTANEOUS | Status: AC
  Filled 2022-04-23: qty 5000

## 2022-04-23 MED ORDER — PANTOPRAZOLE SODIUM 40 MG IV SOLR
40.0000 mg | Freq: Every day | INTRAVENOUS | Status: DC
  Administered 2022-04-23: 40 mg via INTRAVENOUS
  Filled 2022-04-23: qty 10

## 2022-04-23 MED ORDER — PHENYLEPHRINE HCL-NACL 20-0.9 MG/250ML-% IV SOLN
INTRAVENOUS | Status: DC | PRN
  Administered 2022-04-23: 40 ug/min via INTRAVENOUS

## 2022-04-23 MED ORDER — ROSUVASTATIN CALCIUM 5 MG PO TABS
10.0000 mg | ORAL_TABLET | Freq: Every day | ORAL | Status: DC
  Administered 2022-04-23 – 2022-05-03 (×11): 10 mg via ORAL
  Filled 2022-04-23 (×12): qty 2

## 2022-04-23 MED ORDER — ALBUTEROL SULFATE HFA 108 (90 BASE) MCG/ACT IN AERS
INHALATION_SPRAY | RESPIRATORY_TRACT | Status: DC | PRN
  Administered 2022-04-23: 4 via RESPIRATORY_TRACT
  Administered 2022-04-23: 2 via RESPIRATORY_TRACT

## 2022-04-23 MED ORDER — ROCURONIUM BROMIDE 10 MG/ML (PF) SYRINGE
PREFILLED_SYRINGE | INTRAVENOUS | Status: AC
Start: 2022-04-23 — End: ?
  Filled 2022-04-23: qty 10

## 2022-04-23 MED ORDER — LACTATED RINGERS IV SOLN: INTRAVENOUS | Status: DC

## 2022-04-23 MED ORDER — LIDOCAINE 2% (20 MG/ML) 5 ML SYRINGE
INTRAMUSCULAR | Status: DC | PRN
  Administered 2022-04-23: 40 mg via INTRAVENOUS

## 2022-04-23 MED ORDER — ALPRAZOLAM 0.25 MG PO TABS
0.5000 mg | ORAL_TABLET | Freq: Three times a day (TID) | ORAL | Status: DC | PRN
  Administered 2022-04-23 – 2022-05-04 (×23): 0.5 mg via ORAL
  Filled 2022-04-23 (×2): qty 1
  Filled 2022-04-23: qty 2
  Filled 2022-04-23 (×2): qty 1
  Filled 2022-04-23 (×2): qty 2
  Filled 2022-04-23: qty 1
  Filled 2022-04-23 (×5): qty 2
  Filled 2022-04-23: qty 1
  Filled 2022-04-23 (×9): qty 2

## 2022-04-23 MED ORDER — CEFAZOLIN IN SODIUM CHLORIDE 3-0.9 GM/100ML-% IV SOLN
3.0000 g | INTRAVENOUS | Status: AC
  Administered 2022-04-23: 3 g via INTRAVENOUS
  Filled 2022-04-23: qty 100

## 2022-04-23 MED ORDER — INSULIN ASPART 100 UNIT/ML IJ SOLN: 0.0000 [IU] | INTRAMUSCULAR | Status: DC | PRN

## 2022-04-23 MED ORDER — TAMSULOSIN HCL 0.4 MG PO CAPS
0.4000 mg | ORAL_CAPSULE | Freq: Every day | ORAL | Status: DC
  Administered 2022-04-24 – 2022-05-04 (×11): 0.4 mg via ORAL
  Filled 2022-04-23 (×11): qty 1

## 2022-04-23 MED ORDER — MENTHOL 3 MG MT LOZG
1.0000 | LOZENGE | OROMUCOSAL | Status: DC | PRN
  Administered 2022-04-24 – 2022-05-01 (×2): 3 mg via ORAL
  Filled 2022-04-23 (×4): qty 9

## 2022-04-23 MED ORDER — PROPOFOL 10 MG/ML IV BOLUS
INTRAVENOUS | Status: AC
  Filled 2022-04-23: qty 20

## 2022-04-23 MED ORDER — ONDANSETRON HCL 4 MG PO TABS: 4.0000 mg | ORAL_TABLET | Freq: Four times a day (QID) | ORAL | Status: DC | PRN

## 2022-04-23 MED ORDER — DEXAMETHASONE SODIUM PHOSPHATE 10 MG/ML IJ SOLN
INTRAMUSCULAR | Status: DC | PRN
  Administered 2022-04-23: 5 mg via INTRAVENOUS

## 2022-04-23 MED ORDER — SODIUM CHLORIDE 0.9% FLUSH: 3.0000 mL | INTRAVENOUS | Status: DC | PRN

## 2022-04-23 MED ORDER — SUCCINYLCHOLINE CHLORIDE 200 MG/10ML IV SOSY
PREFILLED_SYRINGE | INTRAVENOUS | Status: AC
  Filled 2022-04-23: qty 10

## 2022-04-23 MED ORDER — OXYCODONE HCL 5 MG PO TABS
10.0000 mg | ORAL_TABLET | ORAL | Status: DC | PRN
  Administered 2022-04-23: 10 mg via ORAL
  Administered 2022-04-24: 5 mg via ORAL
  Filled 2022-04-23 (×2): qty 2

## 2022-04-23 MED ORDER — CHLORHEXIDINE GLUCONATE 0.12 % MT SOLN
15.0000 mL | Freq: Once | OROMUCOSAL | Status: AC
  Administered 2022-04-23: 15 mL via OROMUCOSAL
  Filled 2022-04-23: qty 15

## 2022-04-23 MED ORDER — DEXAMETHASONE SODIUM PHOSPHATE 10 MG/ML IJ SOLN
INTRAMUSCULAR | Status: AC
  Filled 2022-04-23: qty 1

## 2022-04-23 MED ORDER — THROMBIN 20000 UNITS EX SOLR
CUTANEOUS | Status: AC
  Filled 2022-04-23: qty 20000

## 2022-04-23 MED ORDER — ACETAMINOPHEN 650 MG RE SUPP: 650.0000 mg | RECTAL | Status: DC | PRN

## 2022-04-23 MED ORDER — GLIPIZIDE ER 10 MG PO TB24
10.0000 mg | ORAL_TABLET | Freq: Two times a day (BID) | ORAL | Status: DC
  Administered 2022-04-23 – 2022-05-04 (×22): 10 mg via ORAL
  Filled 2022-04-23 (×24): qty 1

## 2022-04-23 MED ORDER — 0.9 % SODIUM CHLORIDE (POUR BTL) OPTIME
TOPICAL | Status: DC | PRN
  Administered 2022-04-23: 1000 mL

## 2022-04-23 MED ORDER — CYCLOBENZAPRINE HCL 5 MG PO TABS: 5.0000 mg | ORAL_TABLET | Freq: Three times a day (TID) | ORAL | Status: DC | PRN

## 2022-04-23 MED ORDER — FENTANYL CITRATE (PF) 250 MCG/5ML IJ SOLN
INTRAMUSCULAR | Status: AC
  Filled 2022-04-23: qty 5

## 2022-04-23 MED ORDER — ASPIRIN 81 MG PO TBEC
81.0000 mg | DELAYED_RELEASE_TABLET | Freq: Two times a day (BID) | ORAL | Status: DC
Start: 2022-04-23 — End: 2022-05-04
  Administered 2022-04-23 – 2022-05-04 (×21): 81 mg via ORAL
  Filled 2022-04-23 (×22): qty 1

## 2022-04-23 MED ORDER — PIOGLITAZONE HCL 30 MG PO TABS
45.0000 mg | ORAL_TABLET | Freq: Every day | ORAL | Status: DC
  Administered 2022-04-24 – 2022-05-04 (×11): 45 mg via ORAL
  Filled 2022-04-23 (×12): qty 1

## 2022-04-23 MED ORDER — MUPIROCIN 2 % EX OINT
TOPICAL_OINTMENT | CUTANEOUS | Status: AC
  Filled 2022-04-23: qty 22

## 2022-04-23 MED ORDER — ONDANSETRON HCL 4 MG/2ML IJ SOLN
INTRAMUSCULAR | Status: AC
  Filled 2022-04-23: qty 2

## 2022-04-23 MED ORDER — FENTANYL CITRATE (PF) 100 MCG/2ML IJ SOLN: 25.0000 ug | INTRAMUSCULAR | Status: DC | PRN

## 2022-04-23 MED ORDER — FLUTICASONE FUROATE-VILANTEROL 100-25 MCG/ACT IN AEPB
1.0000 | INHALATION_SPRAY | Freq: Every day | RESPIRATORY_TRACT | Status: DC
  Administered 2022-04-24 – 2022-05-04 (×10): 1 via RESPIRATORY_TRACT
  Filled 2022-04-23: qty 28

## 2022-04-23 MED ORDER — FAMOTIDINE 20 MG PO TABS
20.0000 mg | ORAL_TABLET | Freq: Every day | ORAL | Status: DC | PRN
Start: 2022-04-23 — End: 2022-05-04
  Filled 2022-04-23: qty 1

## 2022-04-23 MED ORDER — ACETAMINOPHEN 160 MG/5ML PO SOLN: 325.0000 mg | Freq: Once | ORAL | Status: DC | PRN

## 2022-04-23 MED ORDER — SODIUM CHLORIDE 0.9% FLUSH
3.0000 mL | Freq: Two times a day (BID) | INTRAVENOUS | Status: DC
  Administered 2022-04-24 – 2022-04-26 (×5): 3 mL via INTRAVENOUS

## 2022-04-23 MED ORDER — FUROSEMIDE 20 MG PO TABS: 20.0000 mg | ORAL_TABLET | Freq: Every day | ORAL | Status: DC | PRN

## 2022-04-23 MED ORDER — PROPOFOL 10 MG/ML IV BOLUS
INTRAVENOUS | Status: DC | PRN
  Administered 2022-04-23: 140 mg via INTRAVENOUS

## 2022-04-23 MED ORDER — CYCLOBENZAPRINE HCL 10 MG PO TABS
10.0000 mg | ORAL_TABLET | Freq: Three times a day (TID) | ORAL | Status: DC | PRN
  Administered 2022-04-24 – 2022-05-04 (×16): 10 mg via ORAL
  Filled 2022-04-23 (×17): qty 1

## 2022-04-23 MED ORDER — MIDAZOLAM HCL 2 MG/2ML IJ SOLN
INTRAMUSCULAR | Status: AC
  Filled 2022-04-23: qty 2

## 2022-04-23 MED ORDER — SUGAMMADEX SODIUM 200 MG/2ML IV SOLN
INTRAVENOUS | Status: DC | PRN
  Administered 2022-04-23: 300 mg via INTRAVENOUS

## 2022-04-23 MED ORDER — MEPERIDINE HCL 25 MG/ML IJ SOLN: 6.2500 mg | INTRAMUSCULAR | Status: DC | PRN

## 2022-04-23 MED ORDER — MIDAZOLAM HCL 2 MG/2ML IJ SOLN
INTRAMUSCULAR | Status: DC | PRN
  Administered 2022-04-23: 2 mg via INTRAVENOUS

## 2022-04-23 MED ORDER — UMECLIDINIUM BROMIDE 62.5 MCG/ACT IN AEPB
1.0000 | INHALATION_SPRAY | Freq: Every day | RESPIRATORY_TRACT | Status: DC
  Administered 2022-04-24 – 2022-05-04 (×10): 1 via RESPIRATORY_TRACT
  Filled 2022-04-23 (×2): qty 7

## 2022-04-23 MED ORDER — ROCURONIUM BROMIDE 10 MG/ML (PF) SYRINGE
PREFILLED_SYRINGE | INTRAVENOUS | Status: DC | PRN
  Administered 2022-04-23: 10 mg via INTRAVENOUS
  Administered 2022-04-23: 30 mg via INTRAVENOUS
  Administered 2022-04-23: 100 mg via INTRAVENOUS
  Administered 2022-04-23: 10 mg via INTRAVENOUS

## 2022-04-23 MED ORDER — ACETAMINOPHEN 325 MG PO TABS
650.0000 mg | ORAL_TABLET | ORAL | Status: DC | PRN
  Administered 2022-04-24 – 2022-04-26 (×6): 650 mg via ORAL
  Filled 2022-04-23 (×5): qty 2

## 2022-04-23 MED ORDER — FENTANYL CITRATE (PF) 250 MCG/5ML IJ SOLN
INTRAMUSCULAR | Status: DC | PRN
Start: 2022-04-23 — End: 2022-04-23
  Administered 2022-04-23: 50 ug via INTRAVENOUS
  Administered 2022-04-23: 100 ug via INTRAVENOUS
  Administered 2022-04-23 (×2): 50 ug via INTRAVENOUS

## 2022-04-23 MED ORDER — ISOSORBIDE MONONITRATE ER 30 MG PO TB24
30.0000 mg | ORAL_TABLET | Freq: Every day | ORAL | Status: DC
  Administered 2022-04-24 – 2022-05-04 (×11): 30 mg via ORAL
  Filled 2022-04-23 (×12): qty 1

## 2022-04-23 MED ORDER — PROPOFOL 10 MG/ML IV BOLUS
INTRAVENOUS | Status: AC
Start: 2022-04-23 — End: ?
  Filled 2022-04-23: qty 20

## 2022-04-23 MED ORDER — ACETAMINOPHEN 325 MG PO TABS: 325.0000 mg | ORAL_TABLET | Freq: Once | ORAL | Status: DC | PRN

## 2022-04-23 MED ORDER — ONDANSETRON HCL 4 MG/2ML IJ SOLN
INTRAMUSCULAR | Status: DC | PRN
  Administered 2022-04-23: 4 mg via INTRAVENOUS

## 2022-04-23 MED ORDER — ALUM & MAG HYDROXIDE-SIMETH 200-200-20 MG/5ML PO SUSP: 30.0000 mL | Freq: Four times a day (QID) | ORAL | Status: DC | PRN

## 2022-04-23 MED ORDER — ACETAMINOPHEN 10 MG/ML IV SOLN: 1000.0000 mg | Freq: Once | INTRAVENOUS | Status: DC | PRN

## 2022-04-23 MED ORDER — ALBUTEROL SULFATE (2.5 MG/3ML) 0.083% IN NEBU: 2.5000 mg | INHALATION_SOLUTION | RESPIRATORY_TRACT | Status: DC | PRN

## 2022-04-23 MED ORDER — SUCCINYLCHOLINE CHLORIDE 200 MG/10ML IV SOSY
PREFILLED_SYRINGE | INTRAVENOUS | Status: DC | PRN
  Administered 2022-04-23: 140 mg via INTRAVENOUS

## 2022-04-23 MED ORDER — PHENOL 1.4 % MT LIQD: 1.0000 | OROMUCOSAL | Status: DC | PRN

## 2022-04-23 MED ORDER — LACTATED RINGERS IV SOLN: INTRAVENOUS | Status: DC | PRN

## 2022-04-23 MED ORDER — ACETAMINOPHEN 325 MG PO TABS
650.0000 mg | ORAL_TABLET | Freq: Two times a day (BID) | ORAL | Status: DC
  Administered 2022-04-23 – 2022-05-04 (×21): 650 mg via ORAL
  Filled 2022-04-23 (×22): qty 2

## 2022-04-23 MED ORDER — AMISULPRIDE (ANTIEMETIC) 5 MG/2ML IV SOLN: 10.0000 mg | Freq: Once | INTRAVENOUS | Status: DC | PRN

## 2022-04-23 MED ORDER — ROCURONIUM BROMIDE 10 MG/ML (PF) SYRINGE
PREFILLED_SYRINGE | INTRAVENOUS | Status: AC
  Filled 2022-04-23: qty 10

## 2022-04-23 MED ORDER — PROMETHAZINE HCL 25 MG/ML IJ SOLN: 6.2500 mg | INTRAMUSCULAR | Status: DC | PRN

## 2022-04-23 SURGICAL SUPPLY — 67 items
BAG COUNTER SPONGE SURGICOUNT (BAG) ×3 IMPLANT
BAND RUBBER #18 3X1/16 STRL (MISCELLANEOUS) ×4 IMPLANT
BASKET BONE COLLECTION (BASKET) ×2 IMPLANT
BENZOIN TINCTURE PRP APPL 2/3 (GAUZE/BANDAGES/DRESSINGS) ×2 IMPLANT
BIT DRILL 13 (BIT) ×1 IMPLANT
BIT DRILL NEURO 2X3.1 SFT TUCH (MISCELLANEOUS) ×1 IMPLANT
BONE VIVIGEN FORMABLE 1.3CC (Bone Implant) ×4 IMPLANT
BUR MATCHSTICK NEURO 3.0 LAGG (BURR) ×2 IMPLANT
CANISTER SUCT 3000ML PPV (MISCELLANEOUS) ×2 IMPLANT
CARTRIDGE OIL MAESTRO DRILL (MISCELLANEOUS) ×1 IMPLANT
CLSR STERI-STRIP ANTIMIC 1/2X4 (GAUZE/BANDAGES/DRESSINGS) ×1 IMPLANT
DERMABOND ADVANCED (GAUZE/BANDAGES/DRESSINGS) ×1
DERMABOND ADVANCED .7 DNX12 (GAUZE/BANDAGES/DRESSINGS) ×1 IMPLANT
DIFFUSER DRILL AIR PNEUMATIC (MISCELLANEOUS) ×2 IMPLANT
DRAIN JACKSON PRT FLT 7MM (DRAIN) ×1 IMPLANT
DRAPE C-ARM 42X72 X-RAY (DRAPES) ×4 IMPLANT
DRAPE LAPAROTOMY 100X72 PEDS (DRAPES) ×2 IMPLANT
DRAPE MICROSCOPE LEICA (MISCELLANEOUS) ×2 IMPLANT
DRILL NEURO 2X3.1 SOFT TOUCH (MISCELLANEOUS) ×2
DRSG OPSITE POSTOP 4X6 (GAUZE/BANDAGES/DRESSINGS) ×1 IMPLANT
DURAPREP 6ML APPLICATOR 50/CS (WOUND CARE) ×2 IMPLANT
ELECT COATED BLADE 2.86 ST (ELECTRODE) ×2 IMPLANT
ELECT REM PT RETURN 9FT ADLT (ELECTROSURGICAL) ×2
ELECTRODE REM PT RTRN 9FT ADLT (ELECTROSURGICAL) ×1 IMPLANT
EVACUATOR SILICONE 100CC (DRAIN) ×1 IMPLANT
GAUZE 4X4 16PLY ~~LOC~~+RFID DBL (SPONGE) IMPLANT
GAUZE SPONGE 4X4 12PLY STRL (GAUZE/BANDAGES/DRESSINGS) ×2 IMPLANT
GLOVE BIO SURGEON STRL SZ7 (GLOVE) ×2 IMPLANT
GLOVE BIO SURGEON STRL SZ8 (GLOVE) ×3 IMPLANT
GLOVE BIOGEL PI IND STRL 7.0 (GLOVE) IMPLANT
GLOVE BIOGEL PI INDICATOR 7.0 (GLOVE) ×2
GLOVE EXAM NITRILE XL STR (GLOVE) IMPLANT
GLOVE INDICATOR 8.5 STRL (GLOVE) ×4 IMPLANT
GOWN STRL REUS W/ TWL LRG LVL3 (GOWN DISPOSABLE) IMPLANT
GOWN STRL REUS W/ TWL XL LVL3 (GOWN DISPOSABLE) ×1 IMPLANT
GOWN STRL REUS W/TWL 2XL LVL3 (GOWN DISPOSABLE) IMPLANT
GOWN STRL REUS W/TWL LRG LVL3 (GOWN DISPOSABLE) ×2
GOWN STRL REUS W/TWL XL LVL3 (GOWN DISPOSABLE) ×2
GRAFT BNE MATRIX VG FRMBL SM 1 (Bone Implant) IMPLANT
HALTER HD/CHIN CERV TRACTION D (MISCELLANEOUS) ×2 IMPLANT
HEMOSTAT POWDER KIT SURGIFOAM (HEMOSTASIS) ×3 IMPLANT
KIT BASIN OR (CUSTOM PROCEDURE TRAY) ×2 IMPLANT
KIT TURNOVER KIT B (KITS) ×2 IMPLANT
NDL SPNL 20GX3.5 QUINCKE YW (NEEDLE) ×1 IMPLANT
NEEDLE SPNL 20GX3.5 QUINCKE YW (NEEDLE) ×2 IMPLANT
NS IRRIG 1000ML POUR BTL (IV SOLUTION) ×2 IMPLANT
OIL CARTRIDGE MAESTRO DRILL (MISCELLANEOUS) ×2
PACK LAMINECTOMY NEURO (CUSTOM PROCEDURE TRAY) ×2 IMPLANT
PIN DISTRACTION 14MM (PIN) ×2 IMPLANT
PLATE 3 60XNS SPNE CVD ANT T (Plate) IMPLANT
PLATE 3 ATLANTIS TRANS (Plate) ×1 IMPLANT
SCREW SD 15MM FA (Screw) ×1 IMPLANT
SCREW ST 15X4XST FXANG NS (Screw) IMPLANT
SCREW ST 15X4XST VA NS SPNE (Screw) IMPLANT
SCREW ST FIX 4 ATL (Screw) ×5 IMPLANT
SCREW ST VAR 4 ATL (Screw) ×2 IMPLANT
SPACER HEDRON C 12X14X8 0D (Spacer) ×3 IMPLANT
SPIKE FLUID TRANSFER (MISCELLANEOUS) ×2 IMPLANT
SPONGE INTESTINAL PEANUT (DISPOSABLE) ×3 IMPLANT
SPONGE SURGIFOAM ABS GEL 100 (HEMOSTASIS) ×2 IMPLANT
STRIP CLOSURE SKIN 1/2X4 (GAUZE/BANDAGES/DRESSINGS) ×2 IMPLANT
SUT VIC AB 3-0 SH 8-18 (SUTURE) ×2 IMPLANT
SUT VIC AB 4-0 PS2 27 (SUTURE) ×2 IMPLANT
TAPE CLOTH 4X10 WHT NS (GAUZE/BANDAGES/DRESSINGS) IMPLANT
TOWEL GREEN STERILE (TOWEL DISPOSABLE) ×2 IMPLANT
TOWEL GREEN STERILE FF (TOWEL DISPOSABLE) ×2 IMPLANT
WATER STERILE IRR 1000ML POUR (IV SOLUTION) ×2 IMPLANT

## 2022-04-23 NOTE — Anesthesia Preprocedure Evaluation (Addendum)
Anesthesia Evaluation  Patient identified by MRN, date of birth, ID band Patient awake    Reviewed: Allergy & Precautions, NPO status , Patient's Chart, lab work & pertinent test results  Airway Mallampati: III  TM Distance: >3 FB Neck ROM: Full    Dental  (+) Edentulous Upper, Edentulous Lower   Pulmonary asthma , sleep apnea , COPD,  COPD inhaler, Current Smoker and Patient abstained from smoking.,     + decreased breath sounds      Cardiovascular hypertension, Pt. on medications + CAD, + Past MI and + Cardiac Stents   Rhythm:Regular Rate:Normal     Neuro/Psych  Headaches, Anxiety    GI/Hepatic Neg liver ROS, GERD  Medicated,  Endo/Other  diabetes, Type 2, Oral Hypoglycemic Agents  Renal/GU negative Renal ROS     Musculoskeletal  (+) Arthritis ,   Abdominal Normal abdominal exam  (+)   Peds  Hematology negative hematology ROS (+)   Anesthesia Other Findings   Reproductive/Obstetrics                            Anesthesia Physical Anesthesia Plan  ASA: 3  Anesthesia Plan: General   Post-op Pain Management:    Induction: Intravenous  PONV Risk Score and Plan: 2 and Ondansetron, Dexamethasone and Midazolam  Airway Management Planned: Oral ETT and Video Laryngoscope Planned  Additional Equipment: None  Intra-op Plan:   Post-operative Plan: Extubation in OR  Informed Consent: I have reviewed the patients History and Physical, chart, labs and discussed the procedure including the risks, benefits and alternatives for the proposed anesthesia with the patient or authorized representative who has indicated his/her understanding and acceptance.     Dental advisory given  Plan Discussed with: CRNA  Anesthesia Plan Comments:       Anesthesia Quick Evaluation

## 2022-04-23 NOTE — H&P (Signed)
Bryan Blair is an 66 y.o. male.   Chief Complaint: Cervical spondylitic myelopathy HPI: 66 year old gentleman with numbness tingling weakness in his hands difficulty walking and balance.  Work-up revealed severe cervical stenosis spinal cord compression at C3-4 C4-5 C5-6.  Due to patient's progression of clinical syndrome imaging findings and failed conservative treatment I recommended anterior cervical discectomies and fusion at those 3 levels.  I have extensively gone over the risks and benefits of the operation with him as well as perioperative course expectations of outcome and alternatives to surgery and he understood and agreed to proceed forward.  Past Medical History:  Diagnosis Date   Anxiety    Arthritis    BPH (benign prostatic hyperplasia)    Complication of anesthesia    difficult to get patient to sleep   COPD (chronic obstructive pulmonary disease) (HCC)    Coronary artery disease    Angioplasty LAD 1995, angioplasty circumflex 1996, stent intervention circumflex 2000   Dysrhythmia    PVC's   Essential hypertension    H/O unstable angina    Headache    Hyperlipidemia    Legally blind    Neuromuscular disorder (HCC)    neuropathy   Neuropathy    Pneumonia    PVC (premature ventricular contraction)    Sleep apnea    no cpap   STEMI (ST elevation myocardial infarction) (HCC) 2000   Type 2 diabetes mellitus (HCC)     Past Surgical History:  Procedure Laterality Date   BACK SURGERY     CARPAL TUNNEL RELEASE Left    CATARACT EXTRACTION W/PHACO Right 03/19/2022   Procedure: CATARACT EXTRACTION PHACO AND INTRAOCULAR LENS PLACEMENT (IOC);  Surgeon: [name], MD;  Location: [facility];  Service: Ophthalmology;  Laterality: Right;  CDE: 35.65   HAND SURGERY     HERNIA REPAIR     JOINT REPLACEMENT     TONSILLECTOMY     ULNAR NERVE REPAIR      Family History  Problem Relation Age of Onset   Heart disease Mother    Heart disease Father    Stroke Father     Social History:  reports that he has been smoking cigarettes. He has quit using smokeless tobacco. He reports that he does not drink alcohol and does not use drugs.  Allergies:  Allergies  Allergen Reactions   Iodine Anaphylaxis   Shellfish Allergy Anaphylaxis   Sodium Hyaluronate (Avian) Swelling   Prochlorperazine Maleate Other (See Comments)    Makes hyperactive   Benadryl [Diphenhydramine] Anxiety   Venomil Honey Bee Venom [Honey Bee Venom] Other (See Comments)    Causes blood poisoning     Medications Prior to Admission  Medication Sig Dispense Refill   acetaminophen (TYLENOL) 325 MG tablet Take 650 mg by mouth 2 (two) times daily.     ALPRAZolam (XANAX) 0.5 MG tablet Take 0.5 mg by mouth 3 (three) times daily as needed for anxiety.     famotidine (PEPCID) 20 MG tablet Take 20 mg by mouth daily as needed for heartburn.     Fluticasone-Umeclidin-Vilant (TRELEGY ELLIPTA) 100-62.5-25 MCG/INH AEPB Inhale into the lungs daily.     glipiZIDE (GLUCOTROL XL) 10 MG 24 hr tablet Take 10 mg by mouth 2 (two) times daily.     ibuprofen (ADVIL) 200 MG tablet Take 200 mg by mouth every 8 (eight) hours as needed for mild pain.     isosorbide mononitrate (IMDUR) 30 MG 24 hr tablet Take 30 mg by mouth daily.  losartan (COZAAR) 25 MG tablet Take 25 mg by mouth daily.     methylPREDNISolone (MEDROL DOSEPAK) 4 MG TBPK tablet Take by mouth.     Multiple Vitamin (MULTIVITAMIN WITH MINERALS) TABS tablet Take 1 tablet by mouth daily.     NON FORMULARY Take 3,000 mg by mouth 2 (two) times daily. [place]     pioglitazone (ACTOS) 45 MG tablet Take 45 mg by mouth daily.     rosuvastatin (CRESTOR) 10 MG tablet Take 10 mg by mouth at bedtime.     tamsulosin (FLOMAX) 0.4 MG CAPS capsule Take 0.4 mg by mouth daily.     albuterol (VENTOLIN HFA) 108 (90 Base) MCG/ACT inhaler albuterol sulfate HFA 90 mcg/actuation aerosol inhaler     aspirin 81 MG tablet Take 81 mg by mouth 2 (two) times daily.      cyclobenzaprine (FLEXERIL) 5 MG tablet Take 5 mg by mouth 3 (three) times daily as needed for muscle spasms.     furosemide (LASIX) 20 MG tablet Take 1 tablet (20 mg total) by mouth daily as needed for edema. 30 tablet 2   guaiFENesin (MUCINEX) 600 MG 12 hr tablet Take 600 mg by mouth 2 (two) times daily.     nitroGLYCERIN (NITROSTAT) 0.4 MG SL tablet Place 0.4 mg under the tongue every 5 (five) minutes as needed for chest pain.      Results for orders placed or performed during the hospital encounter of 04/23/22 (from the past 48 hour(s))  Surgical pcr screen     Status: None   Collection Time: 04/23/22  9:11 AM   Specimen: Nasal Mucosa; Nasal Swab  Result Value Ref Range   MRSA, PCR NEGATIVE NEGATIVE   Staphylococcus aureus NEGATIVE NEGATIVE    Comment: (NOTE) The Xpert SA Assay (FDA approved for NASAL specimens in patients 66 years of age and older), is one component of a comprehensive surveillance program. It is not intended to diagnose infection nor to guide or monitor treatment. Performed at [facility], [address] [place], [place] [address]   Glucose, capillary     Status: Abnormal   Collection Time: 04/23/22  9:19 AM  Result Value Ref Range   Glucose-Capillary 137 (H) 70 - 99 mg/dL    Comment: Glucose reference range applies only to samples taken after fasting for at least 8 hours.   Comment 1 Notify RN    Comment 2 Document in Chart   Type and screen [facility]     Status: None   Collection Time: 04/23/22  9:35 AM  Result Value Ref Range   ABO/RH(D) A POS    Antibody Screen NEG    Sample Expiration      04/26/2022,2359 Performed at [facility], [address] [place], [place] [address]   ABO/Rh     Status: None   Collection Time: 04/23/22  9:40 AM  Result Value Ref Range   ABO/RH(D)      A POS Performed at [facility], [address] [place], [place] [address]   Glucose, capillary     Status: Abnormal   Collection  Time: 04/23/22 11:31 AM  Result Value Ref Range   Glucose-Capillary 124 (H) 70 - 99 mg/dL    Comment: Glucose reference range applies only to samples taken after fasting for at least 8 hours.  Glucose, capillary     Status: Abnormal   Collection Time: 04/23/22  1:27 PM  Result Value Ref Range  Glucose-Capillary 124 (H) 70 - 99 mg/dL    Comment: Glucose reference range applies only to samples taken after fasting for at least 8 hours.   Comment 1 Notify RN    Comment 2 Document in Chart    No results found.  Review of Systems  Musculoskeletal:  Positive for neck pain.  Neurological:  Positive for numbness.    Blood pressure (!) (P) 152/73, pulse 61, temperature 98.1 F (36.7 C), temperature source Oral, resp. rate 20, height 5\' 7"  (1.702 m), weight (!) 142.9 kg, SpO2 94 %. Physical Exam HENT:     Head: Normocephalic.     Right Ear: Tympanic membrane normal.     Nose: Nose normal.     Mouth/Throat:     Mouth: Mucous membranes are moist.  Eyes:     Pupils: Pupils are equal, round, and reactive to light.  Cardiovascular:     Rate and Rhythm: Normal rate.     Pulses: Normal pulses.  Pulmonary:     Effort: Pulmonary effort is normal.  Abdominal:     General: Abdomen is flat.  Neurological:     Mental Status: He is alert.     Comments: Patient with cervical myelopathy strength is 5-5 in his deltoids he is got weakness in his upper extremities 4 to 4+ out of 5 bicep tricep and grip strength lower extremity strength is 5 out of 5 he is hyperreflexic      Assessment/Plan 66 year old presents for ACDF C3-4, C4-5, C5-6.  71, MD 04/23/2022, 3:35 PM

## 2022-04-23 NOTE — Anesthesia Procedure Notes (Addendum)
Procedure Name: Intubation Date/Time: 04/23/2022 4:42 PM  Performed by: Zollie Beckers, CRNAPre-anesthesia Checklist: Patient identified, Emergency Drugs available, Suction available and Patient being monitored Patient Re-evaluated:Patient Re-evaluated prior to induction Oxygen Delivery Method: Circle system utilized Preoxygenation: Pre-oxygenation with 100% oxygen Induction Type: IV induction Laryngoscope Size: Glidescope and 4 Grade View: Grade I Tube type: Oral Tube size: 7.5 mm Number of attempts: 1 Airway Equipment and Method: Rigid stylet and Video-laryngoscopy Placement Confirmation: ETT inserted through vocal cords under direct vision, positive ETCO2 and breath sounds checked- equal and bilateral Secured at: 24 cm Tube secured with: Tape Dental Injury: Teeth and Oropharynx as per pre-operative assessment  Comments: Elective video laryngoscopy d/t cervical neuropathy, c-spine maintained in neutral position during laryngoscopy.

## 2022-04-23 NOTE — Op Note (Signed)
Preoperative diagnosis: Cervical spondylitic myelopathy from severe cervical stenosis cord compression C3-4, C4-5, C5-6.  Postoperative diagnosis: Same  Procedure: Anterior cervical discectomies and fusion at C3-4, C4-5, C5-6 utilizing the globus Hedron titanium cages packed with locally harvested autograft mixed with Vivigen and anterior cervical plating utilizing the Atlantis Medtronic translational plating system  Surgeon: Jillyn Hidden Jodeci Rini  Assistant: Julien Girt  Anesthesia: General  EBL: Minimal  HPI: 66 year old gentleman with progressive worsening neck pain bilateral shoulder and arm pain numbness and tingling weakness in his arms and hands and difficulty walking.  Work-up revealed severe cord compression from large disc herniations at C3-4 C4-5 and C5-6.  And due to the patient's progression clinical exam consistent with a myelopathy cord compression on imaging and failed conservative treatment I recommended anterior cervical discectomies and fusion at those 3 levels.  We extensively reviewed the risks and benefits of the operation with the patient as well as perioperative course expectations of outcome and alternatives of surgery and he understood and agreed to proceed forward.  Operative procedure: Patient was brought into the OR was [facility] general anesthesia positioned supine the neck in slight extension 5 pounds halter traction.  The right side of his neck was prepped and draped in routine sterile fashion.  Preoperative x-ray localized the appropriate level.  A curvilinear incision was made just off the midline to the anterior border of the sternocleidomastoid and the superficial abscess was dissected out divided longitudinally.  The avascular plane between the sternocleidomastoid and strap muscle was developed down to the prevertebral fascia and prevertebral fascia was dissected away with Kitners.  Intraoperative x-ray confirmed identification appropriate level.  So longus goes reflected  laterally self-retaining retractors were placed large anterior osteophytes were bitten off with a Leksell rongeur first attention was taken at C3-4 and C4-5 both disc bases were drilled down capturing the bone shavings and mucus trap to the posterior osteophytic complexes.  Under microscopic lamination under biting of both endplates allowed defecation posterior logical ligament which was removed in piecemeal fashion.  Large disc herniations were removed under the microscope causing severe cord compression at both levels and aggressive under biting of both endplates decompress the central canal and both C4 pedicles and C5 pedicles respectively were identified and C4 nerve roots and C5 nerve roots respectively was skeletonized flush with the pedicle.  At the end discectomies at these 2 levels I selected 9 to 8 mm implants titanium cages packed with locally harvested autograft mixed with Vivigen these were inserted 1 to 2 mm deep to the anterior to byline.  I then reposition the retractor for C5-6 in a similar fashion pathology here was a large central disc this was all removed aggressive undermining both endplates decompress the central canal both C6 nerve roots were decompressed and skeletonized flush with the pedicle.  Then an 8 mm implant was also sized up for C5-6 and this was inserted I then selected a 60 mm Atlantis translational plate and all screws had excellent purchase locking mechanisms were engaged and fluoroscopy confirmed good position of all the implants.  I packed an extensive mount of autograft mix around the cages and underneath the plate prior to plate placement.  And then I placed a JP drain meticulous hemostasis was maintained the wound was copiously irrigated and we closed the wound in layers with opted Vicryl and a running 4 subcuticular.  Dermabond benzoin Steri-Strips and a sterile dressing was applied patient recovery in stable condition.  At the end the case all needle count  sponge counts  were correct.

## 2022-04-23 NOTE — Transfer of Care (Signed)
Immediate Anesthesia Transfer of Care Note  Patient: Bryan Blair  Procedure(s) Performed: Anterior Cervical Decompression Fusion - Cervical three-Cervical four - Cervical four-Cervical five - Cervical five-Cervical six (Spine Cervical)  Patient Location: PACU  Anesthesia Type:General  Level of Consciousness: awake and alert   Airway & Oxygen Therapy: Patient Spontanous Breathing and Patient connected to face mask oxygen  Post-op Assessment: Report given to RN and Post -op Vital signs reviewed and stable  Post vital signs: Reviewed and stable  Last Vitals:  Vitals Value Taken Time  BP    Temp    Pulse 87 04/23/22 2110  Resp 15 04/23/22 2110  SpO2 97 % 04/23/22 2110  Vitals shown include unvalidated device data.  Last Pain:  Vitals:   04/23/22 0919  TempSrc: Oral  PainSc: 4       Patients Stated Pain Goal: 3 (04/23/22 0919)  Complications: No notable events documented.

## 2022-04-24 ENCOUNTER — Encounter (HOSPITAL_COMMUNITY): Payer: Self-pay | Admitting: Neurosurgery

## 2022-04-24 LAB — GLUCOSE, CAPILLARY
Glucose-Capillary: 227 mg/dL — ABNORMAL HIGH (ref 70–99)
Glucose-Capillary: 174 mg/dL — ABNORMAL HIGH (ref 70–99)
Glucose-Capillary: 230 mg/dL — ABNORMAL HIGH (ref 70–99)
Glucose-Capillary: 217 mg/dL — ABNORMAL HIGH (ref 70–99)

## 2022-04-24 MED ORDER — OXYCODONE HCL 5 MG PO TABS
5.0000 mg | ORAL_TABLET | ORAL | Status: DC | PRN
  Administered 2022-04-24: 5 mg via ORAL
  Filled 2022-04-24: qty 1

## 2022-04-24 MED ORDER — OXYCODONE HCL 5 MG PO TABS
5.0000 mg | ORAL_TABLET | ORAL | Status: DC | PRN
  Administered 2022-04-27 – 2022-05-04 (×6): 5 mg via ORAL
  Filled 2022-04-24 (×6): qty 1

## 2022-04-24 MED ORDER — PANTOPRAZOLE SODIUM 40 MG PO TBEC
40.0000 mg | DELAYED_RELEASE_TABLET | Freq: Every day | ORAL | Status: DC
  Administered 2022-04-24 – 2022-04-27 (×4): 40 mg via ORAL
  Filled 2022-04-24 (×6): qty 1

## 2022-04-24 MED ORDER — NICOTINE 14 MG/24HR TD PT24
14.0000 mg | MEDICATED_PATCH | Freq: Every day | TRANSDERMAL | Status: DC
  Administered 2022-04-24 – 2022-05-04 (×11): 14 mg via TRANSDERMAL
  Filled 2022-04-24 (×11): qty 1

## 2022-04-24 MED ORDER — OXYCODONE HCL 5 MG PO TABS
10.0000 mg | ORAL_TABLET | ORAL | Status: DC | PRN
  Administered 2022-04-24 – 2022-05-03 (×27): 10 mg via ORAL
  Filled 2022-04-24 (×27): qty 2

## 2022-04-24 MED FILL — Thrombin For Soln 5000 Unit: CUTANEOUS | Qty: 5000 | Status: AC

## 2022-04-24 NOTE — Progress Notes (Signed)
Orthopedic Tech Progress Note Patient Details:  Bryan Blair [birth year] [id number]  Ortho Devices Type of Ortho Device: Soft collar Ortho Device/Splint Interventions: Ordered, Application, Adjustment   Post Interventions Patient Tolerated: Well Instructions Provided: Care of device, Adjustment of device  Trinna Post 04/24/2022, 3:18 AM

## 2022-04-24 NOTE — Progress Notes (Signed)
Subjective: Patient reports neck pain with NT that has not changed since preop  Objective: Vital signs in last 24 hours: Temp:  [97.7 F (36.5 C)-98.6 F (37 C)] 97.7 F (36.5 C) (07/18 0540) Pulse Rate:  [61-91] 87 (07/18 0540) Resp:  [14-20] 20 (07/18 0540) BP: (132-188)/(55-93) 181/90 (07/18 0540) SpO2:  [92 %-99 %] 94 % (07/18 0540) Weight:  [142.9 kg] 142.9 kg (07/17 0919)  Intake/Output from previous day: 07/17 0701 - 07/18 0700 In: 1400 [I.V.:1400] Out: 540 [Urine:380; Drains:60; Blood:100] Intake/Output this shift: No intake/output data recorded.  Neurologic: Grossly normal  Lab Results: Lab Results  Component Value Date   WBC 10.7 (H) 04/20/2022   HGB 14.6 04/20/2022   HCT 44.0 04/20/2022   MCV 94.0 04/20/2022   PLT 222 04/20/2022   Lab Results  Component Value Date   INR 0.9 04/20/2022   BMET Lab Results  Component Value Date   NA 138 04/20/2022   K 3.9 04/20/2022   CL 103 04/20/2022   CO2 26 04/20/2022   GLUCOSE 101 (H) 04/20/2022   BUN 12 04/20/2022   CREATININE 1.00 04/20/2022   CALCIUM 9.3 04/20/2022    Studies/Results: DG Cervical Spine 1 View  Result Date: 04/23/2022 CLINICAL DATA:  Incorrect needle count EXAM: DG CERVICAL SPINE - 1 VIEW COMPARISON:  04/23/2022, 02/26/2022 FINDINGS: Single frontal view of the cervical spine was obtained. Postsurgical changes are seen from cervical ACDF spanning C3 through C6. Endotracheal tube overlies tracheal air column tip at level of thoracic inlet. There are no unexpected radiopaque foreign bodies identified on the field of view. Specifically, no evidence of surgical needle. Areas of consolidation are seen within the upper lung zones, likely hypoventilatory change. IMPRESSION: 1. No unexpected radiopaque foreign body. No evidence of retained needle. Findings were called to OR 18 at 8:47 p.m. Electronically Signed   By: Sharlet Salina M.D.   On: 04/23/2022 20:51   DG Cervical Spine 1 View  Result Date:  04/23/2022 CLINICAL DATA:  Anterior cervical decompression and fusion. EXAM: DG CERVICAL SPINE - 1 VIEW COMPARISON:  Preoperative imaging. FINDINGS: Two lateral fluoroscopic spot views of the cervical spine obtained in the operating room. Anterior fusion hardware C3 through C6 with interbody spacers. Lower aspect of the hardware is faintly visualized due to overlapping osseous and soft tissue structures. Fluoroscopy time 13 seconds. Dose 2.66 mGy. IMPRESSION: Fluoroscopic spot views during C3-C6 ACDF. Electronically Signed   By: Narda Rutherford M.D.   On: 04/23/2022 20:22   DG C-Arm 1-60 Min-No Report  Result Date: 04/23/2022 Fluoroscopy was utilized by the requesting physician.  No radiographic interpretation.   DG C-Arm 1-60 Min-No Report  Result Date: 04/23/2022 Fluoroscopy was utilized by the requesting physician.  No radiographic interpretation.   DG C-Arm 1-60 Min-No Report  Result Date: 04/23/2022 Fluoroscopy was utilized by the requesting physician.  No radiographic interpretation.    Assessment/Plan: 66 year old male postop day 1 acdf, ambulating well. Patient is requesting SNF since he lives at home alone. Will work with therapy today and see what they say.   LOS: 1 day    Tiana Loft [facility] 04/24/2022, 7:33 AM

## 2022-04-24 NOTE — Anesthesia Postprocedure Evaluation (Signed)
Anesthesia Post Note  Patient: Bryan Blair  Procedure(s) Performed: Anterior Cervical Decompression Fusion - Cervical three-Cervical four - Cervical four-Cervical five - Cervical five-Cervical six (Spine Cervical)     Patient location during evaluation: PACU Anesthesia Type: General Level of consciousness: awake and alert Pain management: pain level controlled Vital Signs Assessment: post-procedure vital signs reviewed and stable Respiratory status: spontaneous breathing, nonlabored ventilation, respiratory function stable and patient connected to nasal cannula oxygen Cardiovascular status: blood pressure returned to baseline and stable Postop Assessment: no apparent nausea or vomiting Anesthetic complications: no   No notable events documented.  Last Vitals:  Vitals:   04/23/22 2249 04/24/22 0540  BP: (!) 155/80 (!) 181/90  Pulse: 84 87  Resp: 16 20  Temp: 37 C 36.5 C  SpO2: 96% 94%    Last Pain:  Vitals:   04/24/22 0639  TempSrc:   PainSc: 3                  Earl Lites P Toniya Rozar

## 2022-04-24 NOTE — Progress Notes (Signed)
Occupational Therapy Evaluation Patient Details Name: Bryan Blair MRN: [id number] DOB: [birth year] Today's Date: 04/24/2022   History of Present Illness Pt is a 66 y.o male s/p ACDF C3-6 on 04/23/2022. PMH significant for BPH, COPD, arthritis, coronary artery disease, legally blind, sleep apnea, STEMI, DMII, carpal tunnel release, neuropathy, and PVC.   Clinical Impression   PTA, pt was independent and living alone. Pt reports he used public transportation due to being legally blind, and had difficulty with many tasks at home such as bathing, cooking, and dressing due to progressively worsening sensation and strength in BUE. Currently, pt requires Min A for UB ADL, and Mod A for LB ADL. Pt performing transfers using RW with min guard A. Pt able to recall 3/3 spinal precautions with min cues. Pt reports that he does not have anyone who could assist with ADL or light house work upon return home. Recommend SNF for continued OT services to optimize strength, safe participation in meaningful tasks, and functional independence in ADL and IADL. Will continue to follow acutely.      Recommendations for follow up therapy are one component of a multi-disciplinary discharge planning process, led by the attending physician.  Recommendations may be updated based on patient status, additional functional criteria and insurance authorization.   Follow Up Recommendations  Skilled nursing-short term rehab (<3 hours/day)    Assistance Recommended at Discharge Intermittent Supervision/Assistance  Patient can return home with the following A little help with walking and/or transfers;A little help with bathing/dressing/bathroom;Assistance with cooking/housework;Assist for transportation    Functional Status Assessment  Patient has had a recent decline in their functional status and demonstrates the ability to make significant improvements in function in a reasonable and predictable amount of time.  Equipment  Recommendations  Other (comment) (Defer to next venue of care)    Recommendations for Other Services       Precautions / Restrictions Precautions Precautions: Cervical Precaution Booklet Issued: Yes (comment) Required Braces or Orthoses: Cervical Brace Cervical Brace: Soft collar;Other (comment) (When OOB. May go to restroom and shower w/o brace. q) Restrictions Weight Bearing Restrictions: No      Mobility Bed Mobility               General bed mobility comments: Pt sitting EOB on arrival and departure    Transfers Overall transfer level: Needs assistance Equipment used: Rolling walker (2 wheels) Transfers: Sit to/from Stand Sit to Stand: Min guard           General transfer comment: Pt performing sit<> stand transfers with min guard A. Pt benefitting from verbal cues for safety to push up from bed.      Balance Overall balance assessment: Needs assistance Sitting-balance support: No upper extremity supported Sitting balance-Leahy Scale: Fair Sitting balance - Comments: Pt unable to don socks, and was observed to have difficulty maintaining upright when attempting to achieve figure 4 position to don socks   Standing balance support: Bilateral upper extremity supported Standing balance-Leahy Scale: Poor Standing balance comment: Reliant on RW                           ADL either performed or assessed with clinical judgement   ADL Overall ADL's : Needs assistance/impaired Eating/Feeding: Set up;Sitting Eating/Feeding Details (indicate cue type and reason): Pt taking sips of soda from cup during session. Pt observed with poor sensation/prioprioception when grasping cup as indicated by nearly squishing stirofoam cup. Grooming: Minimal assistance;Sitting  Upper Body Dressing : Sitting;Minimal assistance Upper Body Dressing Details (indicate cue type and reason): Pt requires min A for brace application Lower Body Dressing: Moderate  assistance;Sit to/from stand Lower Body Dressing Details (indicate cue type and reason): Pt unable to achieve figure 4 position for dressing. Pt reports he usually gets dressed on the floor. Return tomorrow to teach compensatory techniques for LBD Toilet Transfer: Min guard;Regular Toilet;Rolling walker (2 wheels);Ambulation Toilet Transfer Details (indicate cue type and reason): Pt ambulating with RW to bathroom and performing toilet transfer with min Guard A         Functional mobility during ADLs: Min guard;Rolling walker (2 wheels)       Vision Baseline Vision/History: 2 Legally blind;1 Wears glasses Ability to See in Adequate Light: 1 Impaired Patient Visual Report: No change from baseline Additional Comments: Pt reports he is legally blind, and thus cannot drive. Pt able to see therapist reach out for hand shake, etc during session.     Perception     Praxis      Pertinent Vitals/Pain Pain Assessment Pain Assessment: Faces Faces Pain Scale: Hurts little more Pain Location: Operative site/posterior neck Pain Descriptors / Indicators: Discomfort, Operative site guarding Pain Intervention(s): Limited activity within patient's tolerance, Monitored during session, Ice applied     Hand Dominance Right   Extremity/Trunk Assessment Upper Extremity Assessment Upper Extremity Assessment: Generalized weakness (3+/5 gross grasp, 4/5 shoulder flexion/extension, pt with decreased sensation BUE, but reports able to differentiate hot and cold. Pt with less sensation on L palmar surface as comparend to R palmar surface.) RUE Sensation: decreased light touch;decreased proprioception LUE Sensation: decreased light touch;decreased proprioception (Pt with less sensation on L palmar surface as comparend to R palmar surface.) LUE Coordination: decreased fine motor (Pt with decreased coordination as inidicated by difficulty performing finger opposition, and unable to perform first to fifth digit  opposition)   Lower Extremity Assessment Lower Extremity Assessment: Defer to PT evaluation   Cervical / Trunk Assessment Cervical / Trunk Assessment: Neck Surgery   Communication Communication Communication: No difficulties   Cognition Arousal/Alertness: Awake/alert Behavior During Therapy: WFL for tasks assessed/performed Overall Cognitive Status: Within Functional Limits for tasks assessed                                 General Comments: Pt with skill to recall cervical precautions with min cues.     General Comments  VSS. Pt reports no assistance at home.    Exercises     Shoulder Instructions      Home Living Family/patient expects to be discharged to:: Private residence Living Arrangements: Alone Available Help at Discharge: Other (Comment) (Pt reports no family or friends available at d/c) Type of Home: House Home Access: Level entry     Home Layout: One level;Other (Comment) (One step withing house to enter from one living area to the next/ to get to kitchen and bedroom)     Bathroom Shower/Tub: Chief Strategy OfficerTub/shower unit   Bathroom Toilet: Handicapped height     Home Equipment: Rollator (4 wheels);Cane - quad   Additional Comments: Pt reports that he has a gravel driveway that he has to walk through to get into his home.      Prior Functioning/Environment Prior Level of Function : History of Falls (last six months);Independent/Modified Independent;Other (comment)             Mobility Comments: Pt reports that he used to  walk without AD, but was using a rollator prior to surgery. Pt reports that he has had multiple falls. When asked to clarify, he reported "too many to count". ADLs Comments: Pt reports that showering, dressing and cooking tasks were difficult for him prior to spinal surgery. Pt reports he frequently dropped his soap when in the shower, had to get dressed sitting in the floor due to inability to reach his feet, and had max difficulty  moving hot pots and pans in the kitchen due to poor grasp.        OT Problem List: Decreased strength;Decreased range of motion;Decreased activity tolerance;Impaired balance (sitting and/or standing);Impaired vision/perception;Decreased coordination;Decreased safety awareness;Decreased knowledge of use of DME or AE;Decreased knowledge of precautions;Impaired sensation;Obesity;Pain      OT Treatment/Interventions: Self-care/ADL training;Therapeutic exercise;DME and/or AE instruction;Therapeutic activities;Patient/family education    OT Goals(Current goals can be found in the care plan section) Acute Rehab OT Goals Patient Stated Goal: Go to rehab OT Goal Formulation: With patient Time For Goal Achievement: 05/08/22 Potential to Achieve Goals: Good  OT Frequency: Min 2X/week    Co-evaluation              AM-PAC OT "6 Clicks" Daily Activity     Outcome Measure Help from another person eating meals?: A Little Help from another person taking care of personal grooming?: A Little Help from another person toileting, which includes using toliet, bedpan, or urinal?: A Little Help from another person bathing (including washing, rinsing, drying)?: A Little Help from another person to put on and taking off regular upper body clothing?: A Little Help from another person to put on and taking off regular lower body clothing?: A Lot 6 Click Score: 17   End of Session Equipment Utilized During Treatment: Gait belt;Rolling walker (2 wheels);Cervical collar Nurse Communication: Mobility status;Other (comment) (discharge plan)  Activity Tolerance: Patient tolerated treatment well Patient left: in bed;with call bell/phone within reach  OT Visit Diagnosis: Unsteadiness on feet (R26.81);Muscle weakness (generalized) (M62.81);History of falling (Z91.81);Low vision, both eyes (H54.2);Feeding difficulties (R63.3);Pain Pain - Right/Left:  (back) Pain - part of body:  (back)                Time:  1448-1856 OT Time Calculation (min): 26 min Charges:  OT General Charges $OT Visit: 1 Visit OT Evaluation $OT Eval Low Complexity: 1 Low OT Treatments $Self Care/Home Management : 8-22 mins  Ladene Artist, OTR/L [facility] Acute Rehabilitation Office: [phone]   Drue Novel 04/24/2022, 10:07 AM

## 2022-04-24 NOTE — [place] FL2 (Signed)
[place] MEDICAID FL2 LEVEL OF CARE SCREENING TOOL     IDENTIFICATION  Patient Name: Bryan Blair Birthdate: 10-12-1955 Sex: male Admission Date (Current Location): 04/23/2022  [facility] and [place] Number:  [employer] and Address:  [facility] [place], [address], [place], [place] [address]      Provider Number: [id number]  Attending Physician Name and Address:  Donalee Citrin, MD  Relative Name and Phone Number:       Current Level of Care: Hospital Recommended Level of Care: Skilled Nursing Facility Prior Approval Number:    Date Approved/Denied:   PASRR Number: [id number]  Discharge Plan: SNF    Current Diagnoses: Patient Active Problem List   Diagnosis Date Noted   Myelopathy concurrent with and due to spinal stenosis of cervical region [facility]) 04/23/2022   Status post surgery 04/23/2022   Preoperative clearance 03/26/2022   Asthmatic bronchitis , chronic (HCC) 11/07/2021   Morbid obesity due to excess calories (HCC) 11/07/2021   Cigarette smoker 11/07/2021    Orientation RESPIRATION BLADDER Height & Weight     Self, Time, Situation, Place  Normal Continent Weight: (!) 315 lb (142.9 kg) Height:  5\' 7"  (170.2 cm)  BEHAVIORAL SYMPTOMS/MOOD NEUROLOGICAL BOWEL NUTRITION STATUS      Continent Diet (carb modified)  AMBULATORY STATUS COMMUNICATION OF NEEDS Skin   Limited Assist Verbally Surgical wounds (closed neck, honeycomb dressing, change PRN)                       Personal Care Assistance Level of Assistance  Bathing, Feeding, Dressing Bathing Assistance: Limited assistance Feeding assistance: Independent Dressing Assistance: Limited assistance     Functional Limitations Info             SPECIAL CARE FACTORS FREQUENCY  PT (By licensed PT), OT (By licensed OT)     PT Frequency: 5x/wk OT Frequency: 5x/wk            Contractures Contractures Info: Not present    Additional Factors Info  Code Status,  Allergies, Psychotropic Code Status Info: Full Allergies Info: Iodine, Shellfish Allergy, Sodium Hyaluronate (Avian), Compazine (Prochlorperazine), Benadryl (Diphenhydramine), Venomil Honey Bee Venom (Honey Bee Venom) Psychotropic Info: Xanax 0.5mg  3x/day PRN         Current Medications (04/24/2022):  This is the current hospital active medication list Current Facility-Administered Medications  Medication Dose Route Frequency Provider Last Rate Last Admin   0.9 %  sodium chloride infusion  250 mL Intravenous Continuous 04/26/2022, MD       acetaminophen (TYLENOL) tablet 650 mg  650 mg Oral Q4H PRN Donalee Citrin, MD   650 mg at 04/24/22 1342   Or   acetaminophen (TYLENOL) suppository 650 mg  650 mg Rectal Q4H PRN 04/26/22, MD       acetaminophen (TYLENOL) tablet 650 mg  650 mg Oral BID Donalee Citrin, MD   650 mg at 04/24/22 0601   albuterol (PROVENTIL) (2.5 MG/3ML) 0.083% nebulizer solution 2.5 mg  2.5 mg Inhalation Q4H PRN 04/26/22, MD       ALPRAZolam Donalee Citrin) tablet 0.5 mg  0.5 mg Oral TID PRN Prudy Feeler, MD   0.5 mg at 04/24/22 0553   alum & mag hydroxide-simeth (MAALOX/MYLANTA) 200-200-20 MG/5ML suspension 30 mL  30 mL Oral Q6H PRN 04-16-2001, MD       aspirin EC tablet 81 mg  81 mg Oral BID Donalee Citrin, MD  cyclobenzaprine (FLEXERIL) tablet 10 mg  10 mg Oral TID PRN Donalee Citrin, MD   10 mg at 04/24/22 0248   famotidine (PEPCID) tablet 20 mg  20 mg Oral Daily PRN Donalee Citrin, MD       fluticasone furoate-vilanterol (BREO ELLIPTA) 100-25 MCG/ACT 1 puff  1 puff Inhalation Daily Donalee Citrin, MD   1 puff at 04/24/22 0807   And   umeclidinium bromide (INCRUSE ELLIPTA) 62.5 MCG/ACT 1 puff  1 puff Inhalation Daily Donalee Citrin, MD   1 puff at 04/24/22 0981   furosemide (LASIX) tablet 20 mg  20 mg Oral Daily PRN Donalee Citrin, MD       glipiZIDE (GLUCOTROL XL) 24 hr tablet 10 mg  10 mg Oral BID Donalee Citrin, MD   10 mg at 04/24/22 0820   guaiFENesin (MUCINEX) 12 hr tablet 600 mg  600 mg Oral BID PRN  Donalee Citrin, MD       HYDROmorphone (DILAUDID) injection 0.5 mg  0.5 mg Intravenous Q2H PRN Donalee Citrin, MD       isosorbide mononitrate (IMDUR) 24 hr tablet 30 mg  30 mg Oral Daily Donalee Citrin, MD   30 mg at 04/24/22 0820   losartan (COZAAR) tablet 25 mg  25 mg Oral Daily Donalee Citrin, MD   25 mg at 04/24/22 0602   menthol-cetylpyridinium (CEPACOL) lozenge 3 mg  1 lozenge Oral PRN Donalee Citrin, MD       Or   phenol (CHLORASEPTIC) mouth spray 1 spray  1 spray Mouth/Throat PRN Donalee Citrin, MD       multivitamin with minerals tablet 1 tablet  1 tablet Oral Daily Donalee Citrin, MD   1 tablet at 04/24/22 0602   nitroGLYCERIN (NITROSTAT) SL tablet 0.4 mg  0.4 mg Sublingual Q5 min PRN Donalee Citrin, MD       ondansetron [facility]) tablet 4 mg  4 mg Oral Q6H PRN Donalee Citrin, MD       Or   ondansetron [facility]) injection 4 mg  4 mg Intravenous Q6H PRN Donalee Citrin, MD       oxyCODONE (Oxy IR/ROXICODONE) immediate release tablet 10 mg  10 mg Oral Q3H PRN Donalee Citrin, MD   10 mg at 04/24/22 1342   oxyCODONE (Oxy IR/ROXICODONE) immediate release tablet 5 mg  5 mg Oral Q3H PRN Leander Rams, RPH       pantoprazole (PROTONIX) EC tablet 40 mg  40 mg Oral Q supper [place], Lynita Lombard, [place]       pioglitazone (ACTOS) tablet 45 mg  45 mg Oral Daily Donalee Citrin, MD   45 mg at 04/24/22 0820   rosuvastatin (CRESTOR) tablet 10 mg  10 mg Oral QHS Donalee Citrin, MD   10 mg at 04/23/22 2308   sodium chloride flush (NS) 0.9 % injection 3 mL  3 mL Intravenous Q12H Donalee Citrin, MD   3 mL at 04/24/22 0821   sodium chloride flush (NS) 0.9 % injection 3 mL  3 mL Intravenous PRN Donalee Citrin, MD       tamsulosin [facility]) capsule 0.4 mg  0.4 mg Oral Daily Donalee Citrin, MD   0.4 mg at 04/24/22 0602     Discharge Medications: Please see discharge summary for a list of discharge medications.  Relevant Imaging Results:  Relevant Lab Results:   Additional Information SS#: [id number]  Baldemar Lenis, LCSW

## 2022-04-24 NOTE — Evaluation (Signed)
Physical Therapy Evaluation Patient Details Name: Bryan Blair MRN: [id number] DOB: 09-Apr-1956 Today's Date: 04/24/2022  History of Present Illness  Pt is a 66 y.o male s/p ACDF C3-6 on 04/23/2022. PMH significant for BPH, COPD, arthritis, coronary artery disease, legally blind, sleep apnea, STEMI, DMII, carpal tunnel release, neuropathy, and PVC.  Clinical Impression   Patient is s/p above surgery resulting in functional limitations due to the deficits listed below (see PT Problem List). Patient prior to surgery was living alone with h/o falls related to lower extremity weakness. He recently began using a rollator and demonstrated significant weakness in bil LEs during evaluation. Patient remains a high fall risk (in addition to other problems listed below) and would benefit from additional time with therapies to achieve modified independent prior to discharge home.        Recommendations for follow up therapy are one component of a multi-disciplinary discharge planning process, led by the attending physician.  Recommendations may be updated based on patient status, additional functional criteria and insurance authorization.  Follow Up Recommendations Skilled nursing-short term rehab (<3 hours/day) Can patient physically be transported by private vehicle: Yes    Assistance Recommended at Discharge Frequent or constant Supervision/Assistance  Patient can return home with the following  A little help with walking and/or transfers;A little help with bathing/dressing/bathroom;Assistance with cooking/housework;Assist for transportation;Help with stairs or ramp for entrance    Equipment Recommendations None recommended by PT  Recommendations for Other Services       Functional Status Assessment Patient has had a recent decline in their functional status and demonstrates the ability to make significant improvements in function in a reasonable and predictable amount of time.     Precautions  / Restrictions Precautions Precautions: Cervical Precaution Booklet Issued: Yes (comment) Required Braces or Orthoses: Cervical Brace Cervical Brace: Soft collar;Other (comment) (When OOB. May go to restroom and shower w/o brace) Restrictions Weight Bearing Restrictions: No      Mobility  Bed Mobility               General bed mobility comments: Pt sitting EOB on arrival and departure; reports bed mobility at home difficult due to tall bed. Denies use of step stool, but states he probably needs to use one    Transfers Overall transfer level: Needs assistance Equipment used: Rolling walker (2 wheels) Transfers: Sit to/from Stand Sit to Stand: Min guard           General transfer comment: Pt performing sit<> stand transfers with min guard A. Pt benefitting from verbal cues for safety to push up from bed. Guarding for safety due to h/o falls and bil LE weakness    Ambulation/Gait Ambulation/Gait assistance: Min guard Gait Distance (Feet): 60 Feet Assistive device: Rolling walker (2 wheels) Gait Pattern/deviations: Step-through pattern, Decreased stride length, Trunk flexed, Wide base of support Gait velocity: decr     General Gait Details: tends to push RW too far ahead and needs cues for upright posture and proximity to RW. Reports legs begin to feel weak and rubbery but no buckling noted  Stairs Stairs:  (deferred due to leg weakness and need for further rehab)          Wheelchair Mobility    Modified Rankin (Stroke Patients Only)       Balance Overall balance assessment: Needs assistance Sitting-balance support: No upper extremity supported Sitting balance-Leahy Scale: Fair     Standing balance support: Bilateral upper extremity supported Standing balance-Leahy Scale: Poor Standing balance comment:  Reliant on RW                             Pertinent Vitals/Pain Pain Assessment Pain Assessment: Faces Faces Pain Scale: Hurts little  more Pain Location: Operative site/posterior neck Pain Descriptors / Indicators: Discomfort, Operative site guarding Pain Intervention(s): Limited activity within patient's tolerance, Monitored during session, Ice applied    Home Living Family/patient expects to be discharged to:: Private residence Living Arrangements: Alone Available Help at Discharge: Other (Comment) (Pt reports no family or friends available at d/c) Type of Home: House Home Access: Level entry     Alternate Level Stairs-Number of Steps: 1 Home Layout: Two level (One step withing house to enter from one living area to the next/ to get to kitchen and bedroom) Home Equipment: Rollator (4 wheels);Cane - quad Additional Comments: Pt reports that he has a gravel driveway that he has to walk through to get into his home.    Prior Function Prior Level of Function : History of Falls (last six months);Independent/Modified Independent             Mobility Comments: Pt reports that he used to walk without AD, but was using a rollator prior to surgery. Pt reports that he has had multiple falls. When asked to clarify, he reported "too many to count". Reports his legs give out due to weakness ADLs Comments: Pt reports that showering, dressing and cooking tasks were difficult for him prior to spinal surgery. Pt reports he frequently dropped his soap when in the shower, had to get dressed sitting in the floor due to inability to reach his feet, and had max difficulty moving hot pots and pans in the kitchen due to poor grasp.     Hand Dominance   Dominant Hand: Right    Extremity/Trunk Assessment   Upper Extremity Assessment Upper Extremity Assessment: Defer to OT evaluation RUE Sensation: decreased light touch;decreased proprioception LUE Sensation: decreased light touch;decreased proprioception (Pt with less sensation on L palmar surface as comparend to R palmar surface.) LUE Coordination: decreased fine motor (Pt with  decreased coordination as inidicated by difficulty performing finger opposition, and unable to perform first to fifth digit opposition)    Lower Extremity Assessment Lower Extremity Assessment: RLE deficits/detail;LLE deficits/detail RLE Deficits / Details: knee extension 3+, 1st toe extension 4/5 RLE Sensation:  (reports "electric shocks" PTA) RLE Coordination: WNL LLE Deficits / Details: knee extension 3/5, 1 st toe extension 4/5 LLE Sensation:  (reports "electric shocks" PTA) LLE Coordination: WNL    Cervical / Trunk Assessment Cervical / Trunk Assessment: Neck Surgery  Communication   Communication: No difficulties  Cognition Arousal/Alertness: Awake/alert Behavior During Therapy: WFL for tasks assessed/performed Overall Cognitive Status: Within Functional Limits for tasks assessed                                 General Comments: Pt with skill to recall cervical precautions with min cues.        General Comments General comments (skin integrity, edema, etc.): Lengthy discussion re: his lack of assistance at home and his prior level of functioning.    Exercises     Assessment/Plan    PT Assessment Patient needs continued PT services  PT Problem List Decreased strength;Decreased activity tolerance;Decreased balance;Decreased mobility;Decreased knowledge of use of DME;Decreased safety awareness;Decreased knowledge of precautions;Impaired sensation;Obesity;Pain       PT  Treatment Interventions DME instruction;Gait training;Stair training;Functional mobility training;Therapeutic activities;Therapeutic exercise;Patient/family education    PT Goals (Current goals can be found in the Care Plan section)  Acute Rehab PT Goals Patient Stated Goal: to get further rehab before returning home alnoe PT Goal Formulation: With patient Time For Goal Achievement: 05/08/22 Potential to Achieve Goals: Good    Frequency Min 3X/week     Co-evaluation                AM-PAC PT "6 Clicks" Mobility  Outcome Measure Help needed turning from your back to your side while in a flat bed without using bedrails?: A Lot Help needed moving from lying on your back to sitting on the side of a flat bed without using bedrails?: A Lot Help needed moving to and from a bed to a chair (including a wheelchair)?: A Little Help needed standing up from a chair using your arms (e.g., wheelchair or bedside chair)?: A Little Help needed to walk in hospital room?: A Little Help needed climbing 3-5 steps with a railing? : A Lot 6 Click Score: 15    End of Session Equipment Utilized During Treatment: Gait belt Activity Tolerance: Patient limited by fatigue Patient left: in bed;with call bell/phone within reach;Other (comment) (sitting EOB) Nurse Communication: Mobility status;Other (comment) (will need SNF) PT Visit Diagnosis: Repeated falls (R29.6);Muscle weakness (generalized) (M62.81);Difficulty in walking, not elsewhere classified (R26.2)    Time: 7544-9201 PT Time Calculation (min) (ACUTE ONLY): 20 min   Charges:   PT Evaluation $PT Eval Low Complexity: 1 Low           Jerolyn Center, PT Acute Rehabilitation Services  Office [phone]   Zena Amos 04/24/2022, 10:21 AM

## 2022-04-25 LAB — GLUCOSE, CAPILLARY
Glucose-Capillary: 157 mg/dL — ABNORMAL HIGH (ref 70–99)
Glucose-Capillary: 145 mg/dL — ABNORMAL HIGH (ref 70–99)
Glucose-Capillary: 295 mg/dL — ABNORMAL HIGH (ref 70–99)
Glucose-Capillary: 230 mg/dL — ABNORMAL HIGH (ref 70–99)

## 2022-04-25 MED ORDER — INSULIN ASPART 100 UNIT/ML IJ SOLN: 0.0000 [IU] | Freq: Three times a day (TID) | INTRAMUSCULAR | Status: DC

## 2022-04-25 MED ORDER — INSULIN ASPART 100 UNIT/ML IJ SOLN
0.0000 [IU] | Freq: Every day | INTRAMUSCULAR | Status: DC
  Administered 2022-04-25 – 2022-04-26 (×2): 2 [IU] via SUBCUTANEOUS
  Administered 2022-04-27: 4 [IU] via SUBCUTANEOUS
  Administered 2022-04-28: 3 [IU] via SUBCUTANEOUS
  Administered 2022-04-29: 5 [IU] via SUBCUTANEOUS
  Administered 2022-04-30: 4 [IU] via SUBCUTANEOUS
  Administered 2022-05-01 – 2022-05-03 (×3): 2 [IU] via SUBCUTANEOUS

## 2022-04-25 MED ORDER — INSULIN ASPART 100 UNIT/ML IJ SOLN
0.0000 [IU] | Freq: Three times a day (TID) | INTRAMUSCULAR | Status: DC
  Administered 2022-04-25: 8 [IU] via SUBCUTANEOUS
  Administered 2022-04-26: 11 [IU] via SUBCUTANEOUS
  Administered 2022-04-26 (×2): 5 [IU] via SUBCUTANEOUS
  Administered 2022-04-27: 8 [IU] via SUBCUTANEOUS
  Administered 2022-04-27: 3 [IU] via SUBCUTANEOUS
  Administered 2022-04-27: 5 [IU] via SUBCUTANEOUS
  Administered 2022-04-28: 2 [IU] via SUBCUTANEOUS
  Administered 2022-04-29: 5 [IU] via SUBCUTANEOUS
  Administered 2022-04-29: 8 [IU] via SUBCUTANEOUS
  Administered 2022-04-29: 3 [IU] via SUBCUTANEOUS
  Administered 2022-04-30: 5 [IU] via SUBCUTANEOUS
  Administered 2022-04-30: 11 [IU] via SUBCUTANEOUS
  Administered 2022-04-30 – 2022-05-01 (×2): 3 [IU] via SUBCUTANEOUS
  Administered 2022-05-01: 5 [IU] via SUBCUTANEOUS
  Administered 2022-05-01 – 2022-05-02 (×2): 3 [IU] via SUBCUTANEOUS
  Administered 2022-05-02: 5 [IU] via SUBCUTANEOUS
  Administered 2022-05-02 – 2022-05-03 (×2): 2 [IU] via SUBCUTANEOUS
  Administered 2022-05-03: 5 [IU] via SUBCUTANEOUS
  Administered 2022-05-03: 2 [IU] via SUBCUTANEOUS
  Administered 2022-05-04: 5 [IU] via SUBCUTANEOUS

## 2022-04-25 MED ORDER — DEXAMETHASONE SODIUM PHOSPHATE 10 MG/ML IJ SOLN
10.0000 mg | Freq: Four times a day (QID) | INTRAMUSCULAR | Status: AC
  Administered 2022-04-25 – 2022-04-26 (×4): 10 mg via INTRAVENOUS
  Filled 2022-04-25 (×4): qty 1

## 2022-04-25 NOTE — TOC Initial Note (Signed)
Transition of Care [facility]) - Initial/Assessment Note    Patient Details  Name: Bryan Blair MRN: [id number] Date of Birth: 01-06-1956  Transition of Care [facility]) CM/SW Contact:    Geralynn Ochs, LCSW Phone Number: 04/25/2022, 4:06 PM  Clinical Narrative:         CSW had faxed patient's referral out for SNF, but patient had no bed offers due to insurance being out of network. CSW faxed patient's referral out further, as well as contacted insurance company, to determine which SNFs were in network. CSW then met with patient to discuss bed offers received. Patient was unhappy with choices, and asked about other options. Patient asked about CIR, and CSW explained that wasn't recommended for him. Patient asked CSW to check on why and if that would be an option for him, as he knows he needs something and can't go home but doesn't want to go as far away as some of the SNF offers. CSW checked with CIR admissions, but they are unable to accept patient. CSW discussed with [facility] and they will review to determine if they could admit and are in network with patient's insurance. CSW to follow.          Expected Discharge Plan: Skilled Nursing Facility Barriers to Discharge: Continued Medical Work up, Ship broker   Patient Goals and CMS Choice Patient states their goals for this hospitalization and ongoing recovery are:: to get rehab CMS Medicare.gov Compare Post Acute Care list provided to:: Patient Choice offered to / list presented to : Patient  Expected Discharge Plan and Services Expected Discharge Plan: [place] Choice: [place] arrangements for the past 2 months: Single Family Home                                      Prior Living Arrangements/Services Living arrangements for the past 2 months: Single Family Home Lives with:: Self Patient language and need for interpreter reviewed:: No Do you feel  safe going back to the place where you live?: Yes      Need for Family Participation in Patient Care: No (Comment) Care giver support system in place?: No (comment) Current home services: DME Criminal Activity/Legal Involvement Pertinent to Current Situation/Hospitalization: No - Comment as needed  Activities of Daily Living Home Assistive Devices/Equipment: Cane (specify quad or straight), Walker (specify type), Eyeglasses ADL Screening (condition at time of admission) Patient's cognitive ability adequate to safely complete daily activities?: Yes Is the patient deaf or have difficulty hearing?: No Does the patient have difficulty seeing, even when wearing glasses/contacts?: Yes Does the patient have difficulty concentrating, remembering, or making decisions?: No Patient able to express need for assistance with ADLs?: Yes Does the patient have difficulty dressing or bathing?: No Independently performs ADLs?: Yes (appropriate for developmental age) Does the patient have difficulty walking or climbing stairs?: Yes Weakness of Legs: Both Weakness of Arms/Hands: Both  Permission Sought/Granted Permission sought to share information with : Chartered certified accountant granted to share information with : Yes, Verbal Permission Granted     Permission granted to share info w AGENCY: SNF, [facility]        Emotional Assessment Appearance:: Appears stated age Attitude/Demeanor/Rapport: Engaged Affect (typically observed): Appropriate Orientation: : Oriented to Self, Oriented to Place, Oriented to  Time, Oriented to Situation Alcohol / Substance Use: Not Applicable Psych Involvement:  No (comment)  Admission diagnosis:  Myelopathy concurrent with and due to spinal stenosis of cervical region [facility]) [M48.02, G99.2] Status post surgery [Z98.890] Patient Active Problem List   Diagnosis Date Noted   Myelopathy concurrent with and due to spinal stenosis of cervical region ([place])  04/23/2022   Status post surgery 04/23/2022   Preoperative clearance 03/26/2022   Asthmatic bronchitis , chronic ([place]) 11/07/2021   Morbid obesity due to excess calories ([place]) 11/07/2021   Cigarette smoker 11/07/2021   PCP:  Wanita Chamberlain, PA-C Pharmacy:   [id number] [id number] [id number]: [phone]: [phone]    Social Determinants of Health (SDOH) Interventions    Readmission Risk Interventions     No data to display

## 2022-04-25 NOTE — Progress Notes (Signed)
Occupational Therapy Treatment Patient Details Name: Bryan Blair MRN: [id number] DOB: 07-22-1956 Today's Date: 04/25/2022   History of present illness Pt is a 66 y.o male s/p ACDF C3-6 on 04/23/2022. PMH significant for BPH, COPD, arthritis, coronary artery disease, legally blind, sleep apnea, STEMI, DMII, carpal tunnel release, neuropathy, and PVC.   OT comments  Pt progressing towards established OT goals. Pt educated and demonstrating hand strengthening HEP with therapy putty and therapy resistance ball. Pt performing composite flexion and lateral pinch with min verbal cues for technique and inclusion of all digits (L 4th and 5th digits). Pt educated and demonstrating use of sock aid and reacher for donning socks with min A for placement of AE. Pt performing functional mobility with min guard and occasional min A due to mild LOB during ambulation with RW. Pt required min verbal cues for safe use of RW throughout session. Pt benefitted from therapist-initiated rest breaks between tasks to manage decreased endurance. Continue to recommend SNF for continued OT services to optimize safety and independence in ADL and IADL. Will continue to follow acutely.    Recommendations for follow up therapy are one component of a multi-disciplinary discharge planning process, led by the attending physician.  Recommendations may be updated based on patient status, additional functional criteria and insurance authorization.    Follow Up Recommendations  Skilled nursing-short term rehab (<3 hours/day)    Assistance Recommended at Discharge Intermittent Supervision/Assistance  Patient can return home with the following  A little help with walking and/or transfers;A little help with bathing/dressing/bathroom;Assistance with cooking/housework;Assist for transportation   Equipment Recommendations  Other (comment) (Defer to next venue of care)    Recommendations for Other Services      Precautions /  Restrictions Precautions Precautions: Cervical Precaution Booklet Issued: Yes (comment) Required Braces or Orthoses: Cervical Brace Cervical Brace: Soft collar;Other (comment) (when OOB. May go to restroom and shower w/o brace) Restrictions Weight Bearing Restrictions: No       Mobility Bed Mobility               General bed mobility comments: Pt sitting EOB on arrival, and when offered to lie down, reporting he would rather sit in the recliner as this is where he rests at home.    Transfers Overall transfer level: Needs assistance Equipment used: Rolling walker (2 wheels) Transfers: Sit to/from Stand Sit to Stand: Min guard           General transfer comment: Pt performing sit<> stand transfers with min guard A. Pt benefitting from verbal cues for safety to push up from bed. Guarding for safety due to h/o falls and bil LE weakness. Pt requiring min A 1-2x during functional mobility.     Balance Overall balance assessment: Needs assistance Sitting-balance support: No upper extremity supported Sitting balance-Leahy Scale: Fair Sitting balance - Comments: Pt donning socks using AE sitting EOB.   Standing balance support: Bilateral upper extremity supported Standing balance-Leahy Scale: Poor Standing balance comment: Reliant on RW                           ADL either performed or assessed with clinical judgement   ADL Overall ADL's : Needs assistance/impaired   Eating/Feeding Details (indicate cue type and reason): Provided sturdy cup for fluid intake to compensate for sensation/proprioception difficulty with drinking from cup. Provided built up grip for utensils, toothbrush, etc. Grooming: Minimal assistance;Standing;Cueing for safety;Wash/dry Programmer, applications Details (indicate cue type and reason):  Pt washing hands with min A due to requiring external support for RW management and placement.             Lower Body Dressing: Minimal assistance;Sit  to/from stand;Cueing for safety;With adaptive equipment Lower Body Dressing Details (indicate cue type and reason): Pt educated and demonstrating LB dressing within precautions using AE with min A. Pt observed with fatigue and decreased endurance as indicated by audible breath sounds, requiring verbal cues to initiate rest break. Toilet Transfer: Chemical engineer (2 wheels);Ambulation;Cueing for safety Toilet Transfer Details (indicate cue type and reason): Pt ambulating with RW to bathroom and performing toileti transfer and toileting with min Guard A. Pt required min verbal cues for safe use and placement of RW. Toileting- Architect and Hygiene: Min guard;Sit to/from stand       Functional mobility during ADLs: Min guard;Minimal assistance;Cueing for safety;Rolling walker (2 wheels) General ADL Comments: Pt performing functional mobility with min Guard A and occasional min A due to minor LOB when ambulating in hallway.    Extremity/Trunk Assessment Upper Extremity Assessment Upper Extremity Assessment: RUE deficits/detail;LUE deficits/detail (Pt continues to demonstrate decreased BUE strength, ROM, and coordination. Pt reporting pain in biceps with lifting arms to shoulder height.) RUE Sensation: decreased light touch;decreased proprioception LUE Sensation: decreased light touch;decreased proprioception LUE Coordination: decreased fine motor (Pt with decreased coordination as inidicated by difficulty performing finger opposition, and unable to perform first to fifth digit opposition)   Lower Extremity Assessment Lower Extremity Assessment: Defer to PT evaluation        Vision       Perception     Praxis      Cognition Arousal/Alertness: Awake/alert Behavior During Therapy: WFL for tasks assessed/performed Overall Cognitive Status: Within Functional Limits for tasks assessed                                 General Comments: Pt  with skill to recall cervical precautions        Exercises Exercises: Other exercises Other Exercises Other Exercises: Pt educated and provided theraputty and therapy ball to optimize overall hand strength. Pt performed composite finger flexion BUE 10x and lateral pinch BUE 10x. Pt encouraged to continue exercises 3x throughout day.    Shoulder Instructions       General Comments VSS.    Pertinent Vitals/ Pain       Pain Assessment Pain Assessment: Faces Faces Pain Scale: Hurts even more Pain Location: Operative site/posterior neck Pain Descriptors / Indicators: Discomfort, Operative site guarding, Sore Pain Intervention(s): Limited activity within patient's tolerance, Monitored during session, Repositioned, Relaxation  Home Living                                          Prior Functioning/Environment              Frequency  Min 2X/week        Progress Toward Goals  OT Goals(current goals can now be found in the care plan section)  Progress towards OT goals: Progressing toward goals  Acute Rehab OT Goals Patient Stated Goal: Get better OT Goal Formulation: With patient Time For Goal Achievement: 05/08/22 Potential to Achieve Goals: Good ADL Goals Pt Will Perform Upper Body Dressing: with modified independence;sitting Pt Will Perform Lower Body Dressing: with modified independence;sit to/from stand;with  adaptive equipment Pt Will Transfer to Toilet: with modified independence;ambulating Pt/caregiver will Perform Home Exercise Program: Increased strength;Both right and left upper extremity;With written HEP provided  Plan Discharge plan remains appropriate    Co-evaluation                 AM-PAC OT "6 Clicks" Daily Activity     Outcome Measure   Help from another person eating meals?: A Little Help from another person taking care of personal grooming?: A Little Help from another person toileting, which includes using toliet,  bedpan, or urinal?: A Little Help from another person bathing (including washing, rinsing, drying)?: A Little Help from another person to put on and taking off regular upper body clothing?: A Little Help from another person to put on and taking off regular lower body clothing?: A Little 6 Click Score: 18    End of Session Equipment Utilized During Treatment: Rolling walker (2 wheels);Cervical collar  OT Visit Diagnosis: Unsteadiness on feet (R26.81);Muscle weakness (generalized) (M62.81);History of falling (Z91.81);Low vision, both eyes (H54.2);Feeding difficulties (R63.3);Pain Pain - part of body:  (cervical spine)   Activity Tolerance Patient tolerated treatment well   Patient Left in chair;with call bell/phone within reach   Nurse Communication Mobility status        Time: 8182-9937 OT Time Calculation (min): 37 min  Charges: OT General Charges $OT Visit: 1 Visit OT Treatments $Self Care/Home Management : 8-22 mins $Therapeutic Exercise: 8-22 mins  Bryan Blair, OTR/L [facility] Acute Rehabilitation Office: [phone]   Bryan Blair 04/25/2022, 10:20 AM

## 2022-04-25 NOTE — Progress Notes (Signed)
Subjective: Patient reports  doing well neurologically condition of swallowing difficulty.  Objective: Vital signs in last 24 hours: Temp:  [97.9 F (36.6 C)-100.4 F (38 C)] 100 F (37.8 C) (07/19 0916) Pulse Rate:  [65-89] 86 (07/19 0916) Resp:  [17-20] 17 (07/19 0916) BP: (126-187)/(64-89) 126/72 (07/19 0916) SpO2:  [92 %-100 %] 94 % (07/19 0916)  Intake/Output from previous day: 07/18 0701 - 07/19 0700 In: 240 [P.O.:240] Out: 50 [Drains:50] Intake/Output this shift: No intake/output data recorded.  Exam stable still weakness arms and hands improve lower extremity function  Lab Results: No results for input(s): "WBC", "HGB", "HCT", "PLT" in the last 72 hours. BMET No results for input(s): "NA", "K", "CL", "CO2", "GLUCOSE", "BUN", "CREATININE", "CALCIUM" in the last 72 hours.  Studies/Results: DG Cervical Spine 1 View  Result Date: 04/23/2022 CLINICAL DATA:  Incorrect needle count EXAM: DG CERVICAL SPINE - 1 VIEW COMPARISON:  04/23/2022, 02/26/2022 FINDINGS: Single frontal view of the cervical spine was obtained. Postsurgical changes are seen from cervical ACDF spanning C3 through C6. Endotracheal tube overlies tracheal air column tip at level of thoracic inlet. There are no unexpected radiopaque foreign bodies identified on the field of view. Specifically, no evidence of surgical needle. Areas of consolidation are seen within the upper lung zones, likely hypoventilatory change. IMPRESSION: 1. No unexpected radiopaque foreign body. No evidence of retained needle. Findings were called to OR 18 at 8:47 p.m. Electronically Signed   By: Sharlet Salina M.D.   On: 04/23/2022 20:51   DG Cervical Spine 1 View  Result Date: 04/23/2022 CLINICAL DATA:  Anterior cervical decompression and fusion. EXAM: DG CERVICAL SPINE - 1 VIEW COMPARISON:  Preoperative imaging. FINDINGS: Two lateral fluoroscopic spot views of the cervical spine obtained in the operating room. Anterior fusion hardware C3  through C6 with interbody spacers. Lower aspect of the hardware is faintly visualized due to overlapping osseous and soft tissue structures. Fluoroscopy time 13 seconds. Dose 2.66 mGy. IMPRESSION: Fluoroscopic spot views during C3-C6 ACDF. Electronically Signed   By: Narda Rutherford M.D.   On: 04/23/2022 20:22   DG C-Arm 1-60 Min-No Report  Result Date: 04/23/2022 Fluoroscopy was utilized by the requesting physician.  No radiographic interpretation.   DG C-Arm 1-60 Min-No Report  Result Date: 04/23/2022 Fluoroscopy was utilized by the requesting physician.  No radiographic interpretation.   DG C-Arm 1-60 Min-No Report  Result Date: 04/23/2022 Fluoroscopy was utilized by the requesting physician.  No radiographic interpretation.    Assessment/Plan: Postop day 2 anterior cervical discectomy and fusion doing well except increase swallowing difficulty we will bump up his steroids we will work on placing him more likely tomorrow but we will see how his swelling does today with increase steroid dose.  LOS: 2 days     Mariam Dollar 04/25/2022, 9:42 AM

## 2022-04-26 LAB — GLUCOSE, CAPILLARY
Glucose-Capillary: 246 mg/dL — ABNORMAL HIGH (ref 70–99)
Glucose-Capillary: 211 mg/dL — ABNORMAL HIGH (ref 70–99)
Glucose-Capillary: 301 mg/dL — ABNORMAL HIGH (ref 70–99)
Glucose-Capillary: 208 mg/dL — ABNORMAL HIGH (ref 70–99)

## 2022-04-26 MED ORDER — METHYLPREDNISOLONE 4 MG PO TBPK: 4.0000 mg | ORAL_TABLET | Freq: Four times a day (QID) | ORAL | Status: DC

## 2022-04-26 MED ORDER — METHYLPREDNISOLONE 4 MG PO TBPK
8.0000 mg | ORAL_TABLET | Freq: Every morning | ORAL | Status: AC
  Administered 2022-04-26: 8 mg via ORAL
  Filled 2022-04-26: qty 21

## 2022-04-26 MED ORDER — METHYLPREDNISOLONE 4 MG PO TBPK
4.0000 mg | ORAL_TABLET | ORAL | Status: AC
  Administered 2022-04-26: 4 mg via ORAL

## 2022-04-26 MED ORDER — METHYLPREDNISOLONE 4 MG PO TBPK
8.0000 mg | ORAL_TABLET | Freq: Every evening | ORAL | Status: AC
  Administered 2022-04-26: 8 mg via ORAL

## 2022-04-26 MED ORDER — METHYLPREDNISOLONE 4 MG PO TBPK
4.0000 mg | ORAL_TABLET | Freq: Three times a day (TID) | ORAL | Status: DC
  Administered 2022-04-27 (×2): 4 mg via ORAL

## 2022-04-26 MED ORDER — METHYLPREDNISOLONE 4 MG PO TBPK: 8.0000 mg | ORAL_TABLET | Freq: Every evening | ORAL | Status: DC

## 2022-04-26 MED ORDER — SENNA 8.6 MG PO TABS
1.0000 | ORAL_TABLET | Freq: Two times a day (BID) | ORAL | Status: DC
Start: 2022-04-26 — End: 2022-05-04
  Administered 2022-04-26 – 2022-05-04 (×3): 8.6 mg via ORAL
  Filled 2022-04-26 (×14): qty 1

## 2022-04-26 NOTE — Progress Notes (Signed)
    Inpatient Rehabilitation Coordinator  Patient was screened for CIR candidacy. Patient appears to be a potential candidate for CIR/AIR level rehab. I will not have a bed available this week to pursue admit. I will not place a rehab consult. Other rehab venues should be pursued. Acute team and TOC made aware. Please call me with any questions.  Ottie Glazier, RN, MSN Rehab Admissions Coordinator [phone] 04/26/2022 12:41 PM

## 2022-04-26 NOTE — Progress Notes (Addendum)
Occupational Therapy Treatment Patient Details Name: Bryan Blair MRN: [id number] DOB: 1955-11-27 Today's Date: 04/26/2022   History of present illness Pt is a 66 y.o male s/p ACDF C3-6 on 04/23/2022. PMH significant for BPH, COPD, arthritis, coronary artery disease, legally blind, sleep apnea, STEMI, DMII, carpal tunnel release, neuropathy, and PVC.   OT comments  Pt progressing toward OT goals. Upon OT entry, pt on toilet in bathroom and pt reporting he had fallen prior to OT arrival, however, not reporting any injuries; RN present with OT during session. NP notified, and safety zone reported (see below). Pt encouraged to take deep breaths and rest break while still seated on toilet, as pt observed with min labored breathing. Pt performing ambulatory toilet transfer with RW to return to EOB with Min guard to Min A for balance. Pt required min A to adjust neck brace this session. Pt with skill to recall and demonstrate hand strengthening HEP provided. Reviewed precautions and need to engage in safe transfers with staff at this time. Due to patient motivation and potential for progression, recommend discharge to AIR to optimize safety and independence with ADL. Will continue to follow acutely.    Recommendations for follow up therapy are one component of a multi-disciplinary discharge planning process, led by the attending physician.  Recommendations may be updated based on patient status, additional functional criteria and insurance authorization.    Follow Up Recommendations  Acute inpatient rehab (3hours/day)    Assistance Recommended at Discharge Intermittent Supervision/Assistance  Patient can return home with the following  A little help with walking and/or transfers;A little help with bathing/dressing/bathroom;Assistance with cooking/housework;Assist for transportation   Equipment Recommendations  Other (comment) (Defer)    Recommendations for Other Services      Precautions /  Restrictions Precautions Precautions: Cervical Precaution Booklet Issued: Yes (comment) Required Braces or Orthoses: Cervical Brace Cervical Brace: Soft collar;Other (comment) (When OOB. Can have it off when ambulating to bathroom or shower) Restrictions Weight Bearing Restrictions: No       Mobility Bed Mobility                    Transfers Overall transfer level: Needs assistance Equipment used: Rolling walker (2 wheels) Transfers: Sit to/from Stand Sit to Stand: Min guard           General transfer comment: Min guard for safety     Balance Overall balance assessment: Mild deficits observed, not formally tested Sitting-balance support: No upper extremity supported Sitting balance-Leahy Scale: Fair     Standing balance support: Bilateral upper extremity supported Standing balance-Leahy Scale: Poor Standing balance comment: Reliant on RW                           ADL either performed or assessed with clinical judgement   ADL Overall ADL's : Needs assistance/impaired                 Upper Body Dressing : Sitting;Minimal assistance Upper Body Dressing Details (indicate cue type and reason): Min A for brace adjustment     Toilet Transfer: Min guard;Regular Toilet;Rolling walker (2 wheels);Ambulation;Cueing for safety Toilet Transfer Details (indicate cue type and reason): Pt on toilet on OT arrival. Pt performining ambulation with RW from toilet to EOB during session with min guard-Min A for balance and min verbal cues for safe use of RW. Toileting- Water quality scientist and Hygiene: Min guard;Sit to/from stand       Functional mobility  during ADLs: Min guard;Minimal assistance;Cueing for safety;Rolling walker (2 wheels) General ADL Comments: Pt performing functional mobility with min Guard with intermittent min A due to decresed balance.    Extremity/Trunk Assessment Upper Extremity Assessment Upper Extremity Assessment: RUE  deficits/detail;LUE deficits/detail RUE Deficits / Details: decreased strength BUE RUE Sensation: decreased light touch;decreased proprioception LUE Deficits / Details: decreased strength BUE LUE Sensation: decreased light touch;decreased proprioception LUE Coordination: decreased fine motor   Lower Extremity Assessment Lower Extremity Assessment: Defer to PT evaluation        Vision   Additional Comments: Wears glasses. Pt reports he is legally blind.   Perception     Praxis      Cognition Arousal/Alertness: Awake/alert Behavior During Therapy: WFL for tasks assessed/performed Overall Cognitive Status: Within Functional Limits for tasks assessed                                 General Comments: Pt with skill to recall cervical precautions        Exercises Exercises: Other exercises Other Exercises Other Exercises: Pt with skill to recall and demonstrate exercises from prior session, performing with BUE 10x.    Shoulder Instructions       General Comments VSS. Upon OT arrival, pt on toilet in bathroom and pt reporting he had fallen just prior to OT arrival while RN was out of room going to retrieve walker (fall not witnessed by staff). RN entering room simultaneously with OT. Pt reported that he fell on his left buttock and that he was able to scoot to the sink to stabilize himself to rise from the floor. Pt not reporting any injuries, however, observed with min labored breathing and perspiration. OT encouraged pt to take deep breaths and immediately took vitals: SpO2 96% and HR 100bpm; RN continued to be present. Encouraged 5+ minute rest break remaining sitting on toilet where pt was found on arrival. Pt with decreased work of breathing and reporting he feels better prior to ambulatory transfer to EOB using RW and gait belt for safety. Upon return to bed, pt reporting he would like his doctor to know about this fall, and that he additionally washed up at the  sink this morning and bent his neck forward into flexion and hear a "popping sound". Continued to review/reinforce precautions and safe mobility. Pt verbalizing understanding of precautions and to avoid getting OOB without staff at this time. Bed alarm set and RN continuing to assess. NP notified, charge RN notified, NP notified; safety zone completed.     Pertinent Vitals/ Pain       Pain Assessment Pain Assessment: Faces Faces Pain Scale: Hurts a little bit Pain Location: Operative site/posterior neck Pain Descriptors / Indicators: Discomfort, Operative site guarding, Sore Pain Intervention(s): Limited activity within patient's tolerance, Monitored during session  Home Living                                          Prior Functioning/Environment              Frequency  Min 2X/week        Progress Toward Goals  OT Goals(current goals can now be found in the care plan section)  Progress towards OT goals: Progressing toward goals  Acute Rehab OT Goals Patient Stated Goal: Go to rehab OT Goal Formulation:  With patient Time For Goal Achievement: 05/08/22 Potential to Achieve Goals: Good ADL Goals Pt Will Perform Upper Body Dressing: with modified independence;sitting Pt Will Perform Lower Body Dressing: with modified independence;sit to/from stand;with adaptive equipment Pt Will Transfer to Toilet: with modified independence;ambulating Pt/caregiver will Perform Home Exercise Program: Increased strength;Both right and left upper extremity;With written HEP provided  Plan Discharge plan needs to be updated    Co-evaluation                 AM-PAC OT "6 Clicks" Daily Activity     Outcome Measure   Help from another person eating meals?: A Little Help from another person taking care of personal grooming?: A Little Help from another person toileting, which includes using toliet, bedpan, or urinal?: A Little Help from another person bathing (including  washing, rinsing, drying)?: A Little Help from another person to put on and taking off regular upper body clothing?: A Little Help from another person to put on and taking off regular lower body clothing?: A Little 6 Click Score: 18    End of Session Equipment Utilized During Treatment: Gait belt;Rolling walker (2 wheels);Cervical collar  OT Visit Diagnosis: Unsteadiness on feet (R26.81);Muscle weakness (generalized) (M62.81);History of falling (Z91.81);Low vision, both eyes (H54.2);Feeding difficulties (R63.3);Pain Pain - part of body:  (Neck)   Activity Tolerance Patient tolerated treatment well   Patient Left in bed;with call bell/phone within reach;with bed alarm set (sitting EOB)   Nurse Communication Mobility status        Time: 1433-1500 OT Time Calculation (min): 27 min  Charges: OT General Charges $OT Visit: 1 Visit OT Treatments $Self Care/Home Management : 8-22 mins $Therapeutic Exercise: 8-22 mins  Ladene Artist, OTR/L [facility] Acute Rehabilitation Office: [phone]   Drue Novel 04/26/2022, 5:17 PM

## 2022-04-26 NOTE — Progress Notes (Signed)
Report called in to 4NP receiving nurse, to update about patient history and discharge plans to SNF.

## 2022-04-26 NOTE — Progress Notes (Signed)
Subjective: Patient reports doing better less pain improved swallowing stable symptoms in his hands improved walking  Objective: Vital signs in last 24 hours: Temp:  [97.6 F (36.4 C)-100 F (37.8 C)] 98.7 F (37.1 C) (07/20 0720) Pulse Rate:  [53-91] 66 (07/20 0720) Resp:  [17-20] 18 (07/20 0720) BP: (120-159)/(63-91) 137/78 (07/20 0720) SpO2:  [93 %-98 %] 98 % (07/20 0720)  Intake/Output from previous day: No intake/output data recorded. Intake/Output this shift: No intake/output data recorded.  Remains weakness in his grips and arms at 4-4+ ambulation better incision clean dry and intact  Lab Results: No results for input(s): "WBC", "HGB", "HCT", "PLT" in the last 72 hours. BMET No results for input(s): "NA", "K", "CL", "CO2", "GLUCOSE", "BUN", "CREATININE", "CALCIUM" in the last 72 hours.  Studies/Results: No results found.  Assessment/Plan: Postop day 3 anterior cervical doing better awaiting placement stable for discharge when bed becomes available  LOS: 3 days     Mariam Dollar 04/26/2022, 7:52 AM

## 2022-04-26 NOTE — Progress Notes (Addendum)
Physical Therapy Treatment Patient Details Name: Bryan Blair MRN: [id number] DOB: 10/10/1955 Today's Date: 04/26/2022   History of Present Illness Pt is a 66 y.o male s/p ACDF C3-6 on 04/23/2022. PMH significant for BPH, COPD, arthritis, coronary artery disease, legally blind, sleep apnea, STEMI, DMII, carpal tunnel release, neuropathy, and PVC.    PT Comments    Pt demonstrating increased tolerance to activity. Requiring supervision for transfers, able to ambulate 176ft with RW and min guard but demonstrating decreased safety awareness with RW use. Educated on limiting exercise to walking and body weight seated therex. Encouraged to continue mobilizing while inpatient. Pt continues to be motivated to engage with therapy. Recommending AIR upon discharge due to pts motivation and potential to progress to modified independence with intensive multidisciplinary therapies.    Recommendations for follow up therapy are one component of a multi-disciplinary discharge planning process, led by the attending physician.  Recommendations may be updated based on patient status, additional functional criteria and insurance authorization.  Follow Up Recommendations  Acute inpatient rehab (3hours/day)     Assistance Recommended at Discharge Frequent or constant Supervision/Assistance  Patient can return home with the following A little help with walking and/or transfers;A little help with bathing/dressing/bathroom;Assistance with cooking/housework;Assist for transportation;Help with stairs or ramp for entrance   Equipment Recommendations  None recommended by PT    Recommendations for Other Services       Precautions / Restrictions Precautions Precautions: Cervical Precaution Booklet Issued: Yes (comment) Required Braces or Orthoses: Cervical Brace Cervical Brace: Soft collar;Other (comment) (when OOB, can have it off to go to bathroom and shower) Restrictions Weight Bearing Restrictions: No      Mobility  Bed Mobility               General bed mobility comments: pt seated EOB upon arrival    Transfers Overall transfer level: Needs assistance Equipment used: Rolling walker (2 wheels) Transfers: Sit to/from Stand Sit to Stand: Supervision           General transfer comment: supervision for safety    Ambulation/Gait Ambulation/Gait assistance: Min guard Gait Distance (Feet): 150 Feet Assistive device: Rolling walker (2 wheels) Gait Pattern/deviations: Step-through pattern, Drifts right/left, Trunk flexed, Wide base of support Gait velocity: decr Gait velocity interpretation: 1.31 - 2.62 ft/sec, indicative of limited community ambulator   General Gait Details: cues for walker proximity and upright posture. cueing for utlization of shapes on floor to avoid drifting L/R. mild unsteadiness with turns   Stairs             Wheelchair Mobility    Modified Rankin (Stroke Patients Only)       Balance Overall balance assessment: Mild deficits observed, not formally tested                                          Cognition Arousal/Alertness: Awake/alert Behavior During Therapy: WFL for tasks assessed/performed Overall Cognitive Status: Within Functional Limits for tasks assessed                                          Exercises      General Comments        Pertinent Vitals/Pain Pain Assessment Pain Assessment: Faces Faces Pain Scale: Hurts a little bit Pain Location: Operative site/posterior  neck Pain Descriptors / Indicators: Discomfort, Operative site guarding, Sore Pain Intervention(s): Limited activity within patient's tolerance, Monitored during session    Home Living                          Prior Function            PT Goals (current goals can now be found in the care plan section) Acute Rehab PT Goals Patient Stated Goal: to get further rehab before returning home alone PT Goal  Formulation: With patient Time For Goal Achievement: 05/08/22 Potential to Achieve Goals: Good Progress towards PT goals: Progressing toward goals    Frequency    Min 3X/week      PT Plan Current plan remains appropriate    Co-evaluation              AM-PAC PT "6 Clicks" Mobility   Outcome Measure  Help needed turning from your back to your side while in a flat bed without using bedrails?: A Little Help needed moving from lying on your back to sitting on the side of a flat bed without using bedrails?: A Little Help needed moving to and from a bed to a chair (including a wheelchair)?: A Little Help needed standing up from a chair using your arms (e.g., wheelchair or bedside chair)?: A Little Help needed to walk in hospital room?: A Little Help needed climbing 3-5 steps with a railing? : A Lot 6 Click Score: 17    End of Session Equipment Utilized During Treatment: Gait belt Activity Tolerance: Patient tolerated treatment well Patient left: in bed;with call bell/phone within reach;Other (comment) (sitting EOB) Nurse Communication: Mobility status PT Visit Diagnosis: Repeated falls (R29.6);Muscle weakness (generalized) (M62.81);Difficulty in walking, not elsewhere classified (R26.2)     Time: 6578-4696 PT Time Calculation (min) (ACUTE ONLY): 17 min  Charges:  $Gait Training: 8-22 mins                     Davina Poke, SPT Acute Rehabilitation Services  Office: [phone]    Davina Poke 04/26/2022, 11:33 AM

## 2022-04-26 NOTE — Plan of Care (Signed)
  Problem: Education: Goal: Knowledge of General Education information will improve Description: Including pain rating scale, medication(s)/side effects and non-pharmacologic comfort measures Outcome: Progressing   Problem: Health Behavior/Discharge Planning: Goal: Ability to manage health-related needs will improve Outcome: Progressing   Problem: Clinical Measurements: Goal: Ability to maintain clinical measurements within normal limits will improve Outcome: Progressing Goal: Will remain free from infection Outcome: Progressing Goal: Diagnostic test results will improve Outcome: Progressing Goal: Respiratory complications will improve Outcome: Progressing Goal: Cardiovascular complication will be avoided Outcome: Progressing   Problem: Activity: Goal: Risk for activity intolerance will decrease Outcome: Progressing   Problem: Nutrition: Goal: Adequate nutrition will be maintained Outcome: Progressing   Problem: Coping: Goal: Level of anxiety will decrease Outcome: Progressing   Problem: Elimination: Goal: Will not experience complications related to bowel motility Outcome: Progressing Goal: Will not experience complications related to urinary retention Outcome: Progressing   Problem: Pain Managment: Goal: General experience of comfort will improve Outcome: Progressing   Problem: Safety: Goal: Ability to remain free from injury will improve Outcome: Progressing   Problem: Skin Integrity: Goal: Risk for impaired skin integrity will decrease Outcome: Progressing   Problem: Education: Goal: Ability to verbalize activity precautions or restrictions will improve Outcome: Progressing Goal: Knowledge of the prescribed therapeutic regimen will improve Outcome: Progressing Goal: Understanding of discharge needs will improve Outcome: Progressing   Problem: Health Behavior/Discharge Planning: Goal: Identification of resources available to assist in meeting health care  needs will improve Outcome: Progressing   Problem: Education: Goal: Ability to describe self-care measures that may prevent or decrease complications (Diabetes Survival Skills Education) will improve Outcome: Progressing Goal: Individualized Educational Video(s) Outcome: Progressing   Problem: Coping: Goal: Ability to adjust to condition or change in health will improve Outcome: Progressing   Problem: Fluid Volume: Goal: Ability to maintain a balanced intake and output will improve Outcome: Progressing   Problem: Health Behavior/Discharge Planning: Goal: Ability to identify and utilize available resources and services will improve Outcome: Progressing Goal: Ability to manage health-related needs will improve Outcome: Progressing   Problem: Metabolic: Goal: Ability to maintain appropriate glucose levels will improve Outcome: Progressing   Problem: Nutritional: Goal: Maintenance of adequate nutrition will improve Outcome: Progressing Goal: Progress toward achieving an optimal weight will improve Outcome: Progressing   Problem: Skin Integrity: Goal: Risk for impaired skin integrity will decrease Outcome: Progressing   Problem: Tissue Perfusion: Goal: Adequacy of tissue perfusion will improve Outcome: Progressing

## 2022-04-26 NOTE — TOC Progression Note (Signed)
Transition of Care [facility]) - Progression Note    Patient Details  Name: Bryan Blair MRN: [id number] Date of Birth: 1956-06-22  Transition of Care [facility] CM/SW [place], [place] Phone Number: 04/26/2022, 3:39 PM  Clinical Narrative:   CSW alerted by [facility] this morning that they will be able to admit the patient, met with patient to discuss but he had questions and wanted to see CSW. CSW met with patient to discuss, he expressed concerns about his options being so far away. Patient discussed that there are more options that are closer, and CSW explained to him the barrier being that his insurance is out of network with the closer options. Lengthy discussion was had with patient about what in-network options means, and how he can file a complaint with his insurance company when he feels better. Patient asking if [facility] would be an option, he would prefer that since it's closer, but if not would be agreeable to [facility]. CSW checked patient's benefits, and [facility] is not listed as an in-network option. CSW updated [facility] that patient is agreeable, and sent updated PT note for insurance authorization. CSW also noting that patient has transferred units, CSW contacted [facility] covering patient's new unit and provided hand off.     Expected Discharge Plan: [place] Barriers to Discharge: Continued Medical Work up, Ship broker  Expected Discharge Plan and Services Expected Discharge Plan: [place] Choice: [place] arrangements for the past 2 months: Single Family Home                                       Social Determinants of Health (SDOH) Interventions    Readmission Risk Interventions     No data to display

## 2022-04-27 LAB — GLUCOSE, CAPILLARY
Glucose-Capillary: 173 mg/dL — ABNORMAL HIGH (ref 70–99)
Glucose-Capillary: 231 mg/dL — ABNORMAL HIGH (ref 70–99)
Glucose-Capillary: 276 mg/dL — ABNORMAL HIGH (ref 70–99)
Glucose-Capillary: 309 mg/dL — ABNORMAL HIGH (ref 70–99)

## 2022-04-27 MED ORDER — ENOXAPARIN SODIUM 40 MG/0.4ML IJ SOSY
40.0000 mg | PREFILLED_SYRINGE | Freq: Two times a day (BID) | INTRAMUSCULAR | Status: DC
  Administered 2022-04-27 – 2022-05-04 (×15): 40 mg via SUBCUTANEOUS
  Filled 2022-04-27 (×15): qty 0.4

## 2022-04-27 NOTE — Inpatient Diabetes Management (Signed)
Inpatient Diabetes Program Recommendations  AACE/ADA: New Consensus Statement on Inpatient Glycemic Control (2015)  Target Ranges:  Prepandial:   less than 140 mg/dL      Peak postprandial:   less than 180 mg/dL (1-2 hours)      Critically ill patients:  140 - 180 mg/dL   Lab Results  Component Value Date   GLUCAP 173 (H) 04/27/2022    Review of Glycemic Control  Latest Reference Range & Units 04/26/22 06:20 04/26/22 11:42 04/26/22 18:31 04/26/22 21:32 04/27/22 07:34 04/27/22 11:24  Glucose-Capillary 70 - 99 mg/dL 694 (H) 503 (H) 888 (H) 208 (H) 173 (H) 231 (H)  (H): Data is abnormally high  Diabetes history: DM2 Outpatient Diabetes medications:  Glipizide XL 10 mg BID Actos 45 mg QD Current orders for Inpatient glycemic control:  Novolog 0-15 units TID & 0-5 units QHS Actos 45 mg QD Glipizide 10 mg BID Medrol taper  Inpatient Diabetes Program Recommendations:    Please consider discontinuing Actos and Glipizide while inpatient and adding Novolog meal coverage 3 units TID if consumes at least 50%.  Will continue to follow while inpatient.  Thank you, Dulce Sellar, MSN, CDCES Diabetes Coordinator Inpatient Diabetes Program [phone] (team pager from 8a-5p)

## 2022-04-27 NOTE — Progress Notes (Signed)
Subjective: Patient reports  overall stable no change in arms and shoulders legs still improved from preop swallowing is better.  Patient does report having a fall where he collapsed on his backside yesterday evening.  But no sequela from the fall with no increase in neurologic symptoms or pain.  Objective: Vital signs in last 24 hours: Temp:  [97.5 F (36.4 C)-98.7 F (37.1 C)] 97.9 F (36.6 C) (07/21 0709) Pulse Rate:  [60-83] 80 (07/21 0709) Resp:  [14-22] 14 (07/21 0709) BP: (139-157)/(65-86) 151/78 (07/21 0709) SpO2:  [93 %-95 %] 95 % (07/21 0709)  Intake/Output from previous day: No intake/output data recorded. Intake/Output this shift: No intake/output data recorded.  Strength stable 4+ out of 5 grip strength and upper extremities 5 out of 5 lower extremities incision clean dry and intact  Lab Results: No results for input(s): "WBC", "HGB", "HCT", "PLT" in the last 72 hours. BMET No results for input(s): "NA", "K", "CL", "CO2", "GLUCOSE", "BUN", "CREATININE", "CALCIUM" in the last 72 hours.  Studies/Results: No results found.  Assessment/Plan: Postop day 4 anterior cervical discectomy and fusion patient stable for transfer we will start Lovenox we will also get him a [place] collar for showers  LOS: 4 days     Mariam Dollar 04/27/2022, 8:18 AM

## 2022-04-27 NOTE — Progress Notes (Signed)
Physical Therapy Treatment Patient Details Name: Bryan Blair MRN: [id number] DOB: June 03, 1956 Today's Date: 04/27/2022   History of Present Illness Pt is a 66 y.o male s/p ACDF C3-6 on 04/23/2022. Hospital course complicated by fall on 7/20. PMH significant for BPH, COPD, arthritis, coronary artery disease, legally blind, sleep apnea, STEMI, DMII, carpal tunnel release, neuropathy, and PVC.    PT Comments    The pt was agreeable to session, eager to progress endurance and stability. He was able to maintain good stability with straight walking, increased assist to steady and manage RW with turning and navigating in tight spaces. The pt was challenged by backwards walking and walking without BUE support, and needed increased assist to manage. No overt LOB during session, pt will continue to benefit from skilled PT to progress functional stability, independence without need for DME, and endurance.     Recommendations for follow up therapy are one component of a multi-disciplinary discharge planning process, led by the attending physician.  Recommendations may be updated based on patient status, additional functional criteria and insurance authorization.  Follow Up Recommendations  Acute inpatient rehab (3hours/day)     Assistance Recommended at Discharge Frequent or constant Supervision/Assistance  Patient can return home with the following A little help with walking and/or transfers;A little help with bathing/dressing/bathroom;Assistance with cooking/housework;Assist for transportation;Help with stairs or ramp for entrance   Equipment Recommendations  None recommended by PT    Recommendations for Other Services       Precautions / Restrictions Precautions Precautions: Cervical Precaution Booklet Issued: Yes (comment) Required Braces or Orthoses: Cervical Brace Cervical Brace: Soft collar;Other (comment) (when OOB, can have it off to go to bathroom and shower) Restrictions Weight  Bearing Restrictions: No     Mobility  Bed Mobility               General bed mobility comments: pt seated EOB upon arrival    Transfers Overall transfer level: Needs assistance Equipment used: Rolling walker (2 wheels) Transfers: Sit to/from Stand Sit to Stand: Supervision           General transfer comment: supervision for safety    Ambulation/Gait Ambulation/Gait assistance: Min guard, Min assist Gait Distance (Feet): 150 Feet (+ 20 + 20 + 20) Assistive device: Rolling walker (2 wheels) (hallway rail) Gait Pattern/deviations: Step-through pattern, Drifts right/left, Trunk flexed, Wide base of support Gait velocity: decr Gait velocity interpretation: <1.8 ft/sec, indicate of risk for recurrent falls   General Gait Details: pt at times with too quick of movements for safety, espeically with turning. able to complete short bouts of backwards walking and walking with single UE support with increased assist. minA with turns for safety    Balance Overall balance assessment: Needs assistance                           High level balance activites: Backward walking, Direction changes, Turns High Level Balance Comments: increased assist needed, mildly impulsive            Cognition Arousal/Alertness: Awake/alert Behavior During Therapy: WFL for tasks assessed/performed Overall Cognitive Status: Within Functional Limits for tasks assessed                                 General Comments: mildly impulsive with movement, demos good insight to safety with mobility in conversation        Exercises  General Comments General comments (skin integrity, edema, etc.): VSS on RA      Pertinent Vitals/Pain Pain Assessment Pain Assessment: Faces Faces Pain Scale: Hurts a little bit Pain Location: Operative site/posterior neck Pain Descriptors / Indicators: Discomfort, Operative site guarding, Sore Pain Intervention(s): Monitored during  session, Limited activity within patient's tolerance, Repositioned     PT Goals (current goals can now be found in the care plan section) Acute Rehab PT Goals Patient Stated Goal: to get further rehab before returning home alone PT Goal Formulation: With patient Time For Goal Achievement: 05/08/22 Potential to Achieve Goals: Good Progress towards PT goals: Progressing toward goals    Frequency    Min 3X/week      PT Plan Current plan remains appropriate       AM-PAC PT "6 Clicks" Mobility   Outcome Measure  Help needed turning from your back to your side while in a flat bed without using bedrails?: A Little Help needed moving from lying on your back to sitting on the side of a flat bed without using bedrails?: A Little Help needed moving to and from a bed to a chair (including a wheelchair)?: A Little Help needed standing up from a chair using your arms (e.g., wheelchair or bedside chair)?: A Little Help needed to walk in hospital room?: A Little Help needed climbing 3-5 steps with a railing? : A Lot 6 Click Score: 17    End of Session Equipment Utilized During Treatment: Gait belt Activity Tolerance: Patient tolerated treatment well Patient left: with call bell/phone within reach;in chair Nurse Communication: Mobility status PT Visit Diagnosis: Repeated falls (R29.6);Muscle weakness (generalized) (M62.81);Difficulty in walking, not elsewhere classified (R26.2)     Time: 1025-8527 PT Time Calculation (min) (ACUTE ONLY): 17 min  Charges:  $Gait Training: 8-22 mins                     Vickki Muff, PT, DPT   Acute Rehabilitation Department   Ronnie Derby 04/27/2022, 5:10 PM

## 2022-04-28 LAB — GLUCOSE, CAPILLARY
Glucose-Capillary: 116 mg/dL — ABNORMAL HIGH (ref 70–99)
Glucose-Capillary: 145 mg/dL — ABNORMAL HIGH (ref 70–99)
Glucose-Capillary: 106 mg/dL — ABNORMAL HIGH (ref 70–99)
Glucose-Capillary: 278 mg/dL — ABNORMAL HIGH (ref 70–99)

## 2022-04-28 MED ORDER — DEXAMETHASONE SODIUM PHOSPHATE 4 MG/ML IJ SOLN
4.0000 mg | Freq: Once | INTRAMUSCULAR | Status: AC
Start: 2022-04-28 — End: 2022-04-28
  Administered 2022-04-28: 4 mg via INTRAVENOUS
  Filled 2022-04-28: qty 1

## 2022-04-28 NOTE — Progress Notes (Signed)
Neurosurgery Service Progress Note  Subjective: No acute events overnight, feels like his swallowing worsened after coming off steroids, no stridor, no SOB   Objective: Vitals:   04/28/22 0345 04/28/22 0803 04/28/22 0818 04/28/22 0910  BP: (!) 167/79 (!) 158/70    Pulse: (!) 59 90 80   Resp: 16 17 18    Temp: 97.9 F (36.6 C) 97.9 F (36.6 C)  98.2 F (36.8 C)  TempSrc:  Axillary  Oral  SpO2: 96% 93% 94%   Weight:      Height:        Physical Exam: Strength 4+/5 BUE & 5/5 BLE, +minimal hoffman's on R, none on L, incision c/d/I, neck soft  Assessment & Plan: 66 y.o. man s/p ACDF  -placement pending -BG high yesterday but improved this morning, will give one dose of dex to help with swallowing hopefully without worsening his BG control too badly   71  04/28/22 11:03 AM

## 2022-04-28 NOTE — Progress Notes (Signed)
Physical Therapy Treatment Patient Details Name: Bryan Blair MRN: [id number] DOB: 1956/08/27 Today's Date: 04/28/2022   History of Present Illness Pt is a 66 y.o male s/p ACDF C3-6 on 04/23/2022. Hospital course complicated by fall on 7/20. PMH significant for BPH, COPD, arthritis, coronary artery disease, legally blind, sleep apnea, STEMI, DMII, carpal tunnel release, neuropathy, and PVC.    PT Comments    Pt received up ad lib in room, no staff present, pt agreeable to therapy session with emphasis on fall risk prevention, safe use of RW, activity pacing, LE HEP and gradual progression of mobility within tolerance with appropriate supervision. Pt able to perform seated/standing exercises with handout to reinforce and cx precaution handout brought to room. Pt bed alarm turned on at end of session as pt sitting EOB and had been walking in room unassisted; pt frustrated, reinforced reasons why it needs to stay on. Pt continues to benefit from PT services to progress toward functional mobility goals.    Recommendations for follow up therapy are one component of a multi-disciplinary discharge planning process, led by the attending physician.  Recommendations may be updated based on patient status, additional functional criteria and insurance authorization.  Follow Up Recommendations  Acute inpatient rehab (3hours/day)     Assistance Recommended at Discharge Frequent or constant Supervision/Assistance  Patient can return home with the following A little help with walking and/or transfers;A little help with bathing/dressing/bathroom;Assistance with cooking/housework;Assist for transportation;Help with stairs or ramp for entrance   Equipment Recommendations  None recommended by PT    Recommendations for Other Services       Precautions / Restrictions Precautions Precautions: Cervical Precaution Booklet Issued: Yes (comment) Precaution Comments: cx precaution handout brought to  room Required Braces or Orthoses: Cervical Brace Cervical Brace: Soft collar;Other (comment) (when OOB; [place] collar in room for when he showers) Restrictions Weight Bearing Restrictions: No     Mobility  Bed Mobility               General bed mobility comments: pt standing up in room upon PTA arrival    Transfers Overall transfer level: Needs assistance Equipment used: Rolling walker (2 wheels) Transfers: Sit to/from Stand Sit to Stand: Supervision           General transfer comment: cues for safe UE placement, pt ignoring cues    Ambulation/Gait Ambulation/Gait assistance: Min guard Gait Distance (Feet): 20 Feet Assistive device: Rolling walker (2 wheels) (hallway rail) Gait Pattern/deviations: Step-through pattern, Drifts right/left, Trunk flexed, Wide base of support Gait velocity: decr     General Gait Details: short gait task in room, pt self-limiting distance due to pain/fatigue and defers hallway mobility after seated break   Stairs    Verbal review for step sequencing and safety with handout given to reinforce technique with and without rails. Pt defers to perform demo back due to fatigue.           Balance Overall balance assessment: Needs assistance Sitting-balance support: No upper extremity supported Sitting balance-Leahy Scale: Good     Standing balance support: Bilateral upper extremity supported, Reliant on assistive device for balance Standing balance-Leahy Scale: Poor Standing balance comment: Reliant on RW               High Level Balance Comments: increased assist needed, mildly impulsive            Cognition Arousal/Alertness: Awake/alert Behavior During Therapy: WFL for tasks assessed/performed Overall Cognitive Status: Within Functional Limits for tasks assessed  General Comments: mildly impulsive with movement, pt frustrated when therapist turned bed alarm on, but he was standing alone in room  when PTA arrived to room; pt reports he always uses bed alarm, but this was clearly false and pt with hx of fall 2 days prior in hospital. Bed alarm on for safety at end of session, nursing staff notified.        Exercises Other Exercises Other Exercises: seated BUE AROM: shoulder flexion to ~65*, encouraged mobility in pain-free range only, not to push past the pain x10 reps ea Other Exercises: seated BLE AROM: hip flexion, LAQ, ankle pumps x10 reps ea Other Exercises: standing BLE AROM: hip flexion, mini squats x10 reps ea with seated break between exercises Other Exercises: IS x 10 reps (~1500-1750 mL), cues for neutral cx posture needed    General Comments General comments (skin integrity, edema, etc.): VSS on RA      Pertinent Vitals/Pain Pain Assessment Pain Assessment: Faces Faces Pain Scale: Hurts little more Pain Location: Operative site/posterior neck Pain Descriptors / Indicators: Discomfort, Operative site guarding, Sore Pain Intervention(s): Monitored during session, Limited activity within patient's tolerance, Repositioned     PT Goals (current goals can now be found in the care plan section) Acute Rehab PT Goals Patient Stated Goal: to get further rehab before returning home alone PT Goal Formulation: With patient Time For Goal Achievement: 05/08/22 Progress towards PT goals: Progressing toward goals    Frequency    Min 3X/week      PT Plan Current plan remains appropriate       AM-PAC PT "6 Clicks" Mobility   Outcome Measure  Help needed turning from your back to your side while in a flat bed without using bedrails?: A Little Help needed moving from lying on your back to sitting on the side of a flat bed without using bedrails?: A Little Help needed moving to and from a bed to a chair (including a wheelchair)?: A Little Help needed standing up from a chair using your arms (e.g., wheelchair or bedside chair)?: A Little Help needed to walk in hospital  room?: A Little Help needed climbing 3-5 steps with a railing? : Total 6 Click Score: 16    End of Session Equipment Utilized During Treatment: Gait belt Activity Tolerance: Patient tolerated treatment well Patient left: with call bell/phone within reach;in bed;with bed alarm set;Other (comment) (pt sitting EOB) Nurse Communication: Mobility status;Other (comment) (pt not wanting bed alarm on but kept on at end of session due to high fall risk) PT Visit Diagnosis: Repeated falls (R29.6);Muscle weakness (generalized) (M62.81);Difficulty in walking, not elsewhere classified (R26.2)     Time: 7619-5093 PT Time Calculation (min) (ACUTE ONLY): 24 min  Charges:  $Gait Training: 8-22 mins $Therapeutic Exercise: 8-22 mins                     Stesha Neyens P., PTA Acute Rehabilitation Services Secure Chat Preferred 9a-5:30pm Office: [phone]    Dorathy K[facility] 04/28/2022, 5:40 PM

## 2022-04-29 ENCOUNTER — Inpatient Hospital Stay (HOSPITAL_COMMUNITY): Payer: No Typology Code available for payment source

## 2022-04-29 DIAGNOSIS — M4322 Fusion of spine, cervical region: Secondary | ICD-10-CM | POA: Diagnosis not present

## 2022-04-29 DIAGNOSIS — R600 Localized edema: Secondary | ICD-10-CM | POA: Diagnosis not present

## 2022-04-29 DIAGNOSIS — R4702 Dysphasia: Secondary | ICD-10-CM | POA: Diagnosis not present

## 2022-04-29 DIAGNOSIS — Z8709 Personal history of other diseases of the respiratory system: Secondary | ICD-10-CM | POA: Diagnosis not present

## 2022-04-29 LAB — RENAL FUNCTION PANEL
Albumin: 3 g/dL — ABNORMAL LOW (ref 3.5–5.0)
Anion gap: 8 (ref 5–15)
BUN: 16 mg/dL (ref 8–23)
CO2: 29 mmol/L (ref 22–32)
Calcium: 9.5 mg/dL (ref 8.9–10.3)
Chloride: 101 mmol/L (ref 98–111)
Creatinine, Ser: 0.94 mg/dL (ref 0.61–1.24)
GFR, Estimated: >60 mL/min (ref >60)
Glucose, Bld: 168 mg/dL — ABNORMAL HIGH (ref 70–99)
Phosphorus: 2.7 mg/dL (ref 2.5–4.6)
Potassium: 3.7 mmol/L (ref 3.5–5.1)
Sodium: 138 mmol/L (ref 135–145)

## 2022-04-29 LAB — GLUCOSE, CAPILLARY
Glucose-Capillary: 229 mg/dL — ABNORMAL HIGH (ref 70–99)
Glucose-Capillary: 166 mg/dL — ABNORMAL HIGH (ref 70–99)
Glucose-Capillary: 254 mg/dL — ABNORMAL HIGH (ref 70–99)
Glucose-Capillary: 363 mg/dL — ABNORMAL HIGH (ref 70–99)

## 2022-04-29 MED ORDER — SODIUM CHLORIDE 0.9 % IV SOLN
INTRAVENOUS | Status: DC
Start: 2022-04-29 — End: 2022-05-04

## 2022-04-29 MED ORDER — DEXAMETHASONE SODIUM PHOSPHATE 4 MG/ML IJ SOLN
4.0000 mg | Freq: Four times a day (QID) | INTRAMUSCULAR | Status: AC
  Administered 2022-04-29 (×3): 4 mg via INTRAVENOUS
  Filled 2022-04-29 (×3): qty 1

## 2022-04-29 NOTE — Progress Notes (Signed)
Neurosurgery Service Progress Note  Subjective: No acute events overnight, feels like his swallowing improved with the AM dex and then by late afternoon / early evening got worse again, no stridor / SOB, today he explained that he's had what sounds like esophageal stenosis / stricture in the past that had to have EGD for dilation and this feels exactly like that  Objective: Vitals:   04/28/22 2311 04/29/22 0312 04/29/22 0755 04/29/22 0826  BP: (!) 166/73 (!) 159/82 (!) 162/89   Pulse: 92 85 88 60  Resp:    18  Temp: 98.5 F (36.9 C) 98.3 F (36.8 C) 98.2 F (36.8 C)   TempSrc:   Oral   SpO2: 93% 92% 97% 95%  Weight:      Height:        Physical Exam: Strength 4+/5 BUE & 5/5 BLE, +minimal hoffman's on R, none on L, incision c/d/I, neck soft, no stridor, no accessory use or tachypnea  Assessment & Plan: 66 y.o. man s/p ACDF with some post-op acute on chronic dysphagia  -will get a lateral C-spine xr to evaluate for degree of edema and restart his steroids x3 doses -likely to have some decreased PO intake, will restart IVF and get an RFP to eval   Maisie Fus A Phelan Schadt  04/29/22 10:40 AM

## 2022-04-30 LAB — GLUCOSE, CAPILLARY
Glucose-Capillary: 309 mg/dL — ABNORMAL HIGH (ref 70–99)
Glucose-Capillary: 198 mg/dL — ABNORMAL HIGH (ref 70–99)
Glucose-Capillary: 219 mg/dL — ABNORMAL HIGH (ref 70–99)
Glucose-Capillary: 301 mg/dL — ABNORMAL HIGH (ref 70–99)

## 2022-04-30 MED ORDER — METHYLPREDNISOLONE 4 MG PO TBPK
8.0000 mg | ORAL_TABLET | Freq: Every morning | ORAL | Status: AC
  Administered 2022-04-30: 8 mg via ORAL
  Filled 2022-04-30: qty 21

## 2022-04-30 MED ORDER — INSULIN ASPART 100 UNIT/ML IJ SOLN
4.0000 [IU] | Freq: Three times a day (TID) | INTRAMUSCULAR | Status: DC
Start: 2022-04-30 — End: 2022-05-04
  Administered 2022-04-30 – 2022-05-04 (×11): 4 [IU] via SUBCUTANEOUS

## 2022-04-30 MED ORDER — DEXAMETHASONE SODIUM PHOSPHATE 10 MG/ML IJ SOLN
6.0000 mg | Freq: Once | INTRAMUSCULAR | Status: AC
  Administered 2022-04-30: 6 mg via INTRAVENOUS
  Filled 2022-04-30: qty 1

## 2022-04-30 MED ORDER — METHYLPREDNISOLONE 4 MG PO TBPK
8.0000 mg | ORAL_TABLET | Freq: Every evening | ORAL | Status: AC
  Administered 2022-05-01: 8 mg via ORAL

## 2022-04-30 MED ORDER — METHYLPREDNISOLONE 4 MG PO TBPK
4.0000 mg | ORAL_TABLET | ORAL | Status: AC
  Administered 2022-04-30: 4 mg via ORAL

## 2022-04-30 MED ORDER — METHYLPREDNISOLONE 4 MG PO TBPK
4.0000 mg | ORAL_TABLET | Freq: Four times a day (QID) | ORAL | Status: DC
  Administered 2022-05-02 – 2022-05-04 (×8): 4 mg via ORAL

## 2022-04-30 MED ORDER — METHYLPREDNISOLONE 4 MG PO TBPK
8.0000 mg | ORAL_TABLET | Freq: Every evening | ORAL | Status: AC
  Administered 2022-04-30: 8 mg via ORAL

## 2022-04-30 MED ORDER — METHYLPREDNISOLONE 4 MG PO TBPK
4.0000 mg | ORAL_TABLET | Freq: Three times a day (TID) | ORAL | Status: AC
  Administered 2022-05-01 (×3): 4 mg via ORAL

## 2022-04-30 NOTE — Inpatient Diabetes Management (Addendum)
Inpatient Diabetes Program Recommendations  AACE/ADA: New Consensus Statement on Inpatient Glycemic Control (2015)  Target Ranges:  Prepandial:   less than 140 mg/dL      Peak postprandial:   less than 180 mg/dL (1-2 hours)      Critically ill patients:  140 - 180 mg/dL   Lab Results  Component Value Date   GLUCAP 309 (H) 04/30/2022    Review of Glycemic Control  Latest Reference Range & Units 04/29/22 12:08 04/29/22 16:10 04/29/22 21:17 04/30/22 07:56  Glucose-Capillary 70 - 99 mg/dL 786 (H) 754 (H) 492 (H) 309 (H)  (H): Data is abnormally high Diabetes history: DM2 Outpatient Diabetes medications:  Glipizide XL 10 mg BID Actos 45 mg QD Current orders for Inpatient glycemic control: Novolog 0-15 units TID & HS, Actos 45 mg QD, Glipizide 10 mg BID  Inpatient Diabetes Program Recommendations:    Consider adding A1C?  Addendum: Noted steroid dose pack added. Consider adding Novolog 4 units TID (assuming patient consuming >50% of meals).  Following.   Thanks, Lujean Rave, MSN, RNC-OB Diabetes Coordinator [phone] (8a-5p)

## 2022-04-30 NOTE — Care Management Important Message (Signed)
Important Message  Patient Details  Name: Bryan Blair MRN: [id number] Date of Birth: 1955-12-02   Medicare Important Message Given:  Yes     [name] Stefan Church 04/30/2022, 8:40 AM

## 2022-04-30 NOTE — Progress Notes (Signed)
Physical Therapy Treatment Patient Details Name: Bryan Blair MRN: [id number] DOB: Feb 04, 1956 Today's Date: 04/30/2022   History of Present Illness Pt is a 66 y.o male s/p ACDF C3-6 on 04/23/2022. Hospital course complicated by fall on 7/20. PMH significant for BPH, COPD, arthritis, coronary artery disease, legally blind, sleep apnea, STEMI, DMII, carpal tunnel release, neuropathy, and PVC.    PT Comments    Patient progressing well towards PT goals. Session focused on LE strengthening and ambulation progression. Pt continues to be impulsive with impaired safety awareness but highly motivated to participate in therapy and return to PLOF. Worked on step ups with RLE in stair well with Min A for support. Noted to have 2-3/4 DOE with ambulation but VSS on RA with HR up to 124 bpm max with activity. Continues to have deficits with endurance, strength, balance and overall safe mobility. Eager to get to rehab. Per pt, plans to d/c to [facility] inpatient rehab today. Will follow.   Recommendations for follow up therapy are one component of a multi-disciplinary discharge planning process, led by the attending physician.  Recommendations may be updated based on patient status, additional functional criteria and insurance authorization.  Follow Up Recommendations  Acute inpatient rehab (3hours/day) Can patient physically be transported by private vehicle: Yes   Assistance Recommended at Discharge Intermittent Supervision/Assistance  Patient can return home with the following A little help with walking and/or transfers;A little help with bathing/dressing/bathroom;Assistance with cooking/housework;Assist for transportation;Help with stairs or ramp for entrance   Equipment Recommendations  None recommended by PT    Recommendations for Other Services       Precautions / Restrictions Precautions Precautions: Cervical Precaution Booklet Issued: Yes (comment) Required Braces or Orthoses: Cervical  Brace Cervical Brace: Soft collar;Other (comment) Restrictions Weight Bearing Restrictions: No     Mobility  Bed Mobility               General bed mobility comments: Sitting EOB upon PT arrival.    Transfers Overall transfer level: Needs assistance Equipment used: Rolling walker (2 wheels) Transfers: Sit to/from Stand Sit to Stand: Supervision           General transfer comment: supervision for safety. Stood from [employer].    Ambulation/Gait Ambulation/Gait assistance: Min guard, Min assist Gait Distance (Feet): 150 Feet Assistive device: Rolling walker (2 wheels) Gait Pattern/deviations: Step-through pattern, Drifts right/left, Trunk flexed, Wide base of support Gait velocity: fast Gait velocity interpretation: 1.31 - 2.62 ft/sec, indicative of limited community ambulator   General Gait Details: Fast, impulsive gait with 1 instance of tripping over RW legs due to speed and wide BoS; 2-3/4 DOE. Reports hx of " bad knees"   Stairs Stairs: Yes Stairs assistance: Min assist Stair Management: One rail Right, Step to pattern Number of Stairs: 3 General stair comments: Cues for technique/safety, heavy use of rail and min A for support esp on the descent. Up with RLE and down with LLE   Wheelchair Mobility    Modified Rankin (Stroke Patients Only)       Balance Overall balance assessment: Needs assistance Sitting-balance support: Feet supported, No upper extremity supported Sitting balance-Leahy Scale: Good     Standing balance support: During functional activity Standing balance-Leahy Scale: Poor Standing balance comment: Reliant on RW or at least 1 UE support                            Cognition Arousal/Alertness: Awake/alert Behavior During Therapy:  Impulsive Overall Cognitive Status: No family/caregiver present to determine baseline cognitive functioning Area of Impairment: Safety/judgement                          Safety/Judgement: Decreased awareness of safety     General Comments: Impulsive; question safety awareness.        Exercises Other Exercises Other Exercises: Step ups x7 on RLE, attempted to step up with LLE and down but knee instability noted.    General Comments General comments (skin integrity, edema, etc.): VSS on RA, SP02 remained >92% on RA and HR up to 124 bpm with activity.      Pertinent Vitals/Pain Pain Assessment Pain Assessment: Faces Faces Pain Scale: Hurts little more Pain Location: Operative site/posterior neck Pain Descriptors / Indicators: Discomfort, Operative site guarding, Sore Pain Intervention(s): Monitored during session, Repositioned    Home Living                          Prior Function            PT Goals (current goals can now be found in the care plan section) Progress towards PT goals: Progressing toward goals    Frequency    Min 3X/week      PT Plan Current plan remains appropriate    Co-evaluation              AM-PAC PT "6 Clicks" Mobility   Outcome Measure  Help needed turning from your back to your side while in a flat bed without using bedrails?: A Little Help needed moving from lying on your back to sitting on the side of a flat bed without using bedrails?: A Little Help needed moving to and from a bed to a chair (including a wheelchair)?: A Little Help needed standing up from a chair using your arms (e.g., wheelchair or bedside chair)?: A Little Help needed to walk in hospital room?: A Little Help needed climbing 3-5 steps with a railing? : A Little 6 Click Score: 18    End of Session Equipment Utilized During Treatment: Gait belt Activity Tolerance: Patient tolerated treatment well Patient left: in bed;with call bell/phone within reach (sitting EOB) Nurse Communication: Mobility status PT Visit Diagnosis: Repeated falls (R29.6);Muscle weakness (generalized) (M62.81);Difficulty in walking, not elsewhere  classified (R26.2)     Time: 6213-0865 PT Time Calculation (min) (ACUTE ONLY): 19 min  Charges:  $Therapeutic Activity: 8-22 mins                     Vale Haven, PT, DPT Acute Rehabilitation Services Secure chat preferred Office [phone]      [name] 04/30/2022, 1:13 PM

## 2022-04-30 NOTE — Progress Notes (Addendum)
Subjective: Patient reports  increased swallowing difficulty when off steroids so I do think we have to keep him on it for a longer period of time  Objective: Vital signs in last 24 hours: Temp:  [98 F (36.7 C)-98.7 F (37.1 C)] 98.4 F (36.9 C) (07/24 1127) Pulse Rate:  [59-102] 59 (07/24 1127) Resp:  [18] 18 (07/24 0759) BP: (150-191)/(77-90) 159/80 (07/24 1127) SpO2:  [95 %-98 %] 98 % (07/24 1127)  Intake/Output from previous day: No intake/output data recorded. Intake/Output this shift: No intake/output data recorded.  Improved neurologically improved strength and sensation in his hands incision clean dry and intact  Lab Results: No results for input(s): "WBC", "HGB", "HCT", "PLT" in the last 72 hours. BMET Recent Labs    04/29/22 1107  NA 138  K 3.7  CL 101  CO2 29  GLUCOSE 168*  BUN 16  CREATININE 0.94  CALCIUM 9.5    Studies/Results: DG Cervical Spine 2 or 3 views  Result Date: 04/29/2022 CLINICAL DATA:  Dysphasia.  Sore throat. EXAM: CERVICAL SPINE - 2-3 VIEW COMPARISON:  None FINDINGS: There are postsurgical changes identified from recent anterior sideplate and interbody fusion of C3 through C6. Hardware in anatomic alignment. Nonspecific prevertebral soft tissue edema is noted which is likely postoperative. Airway appears patent. IMPRESSION: 1. Status post recent anterior sideplate and interbody fusion of C3 through C6. 2. Nonspecific, postoperative prevertebral soft tissue edema with patent airway. Electronically Signed   By: Signa Kell M.D.   On: 04/29/2022 12:37    Assessment/Plan: Postop day 7 ACDF doing well neurologically but significant dysphagia.  Responds very well to steroids despite the fact that his sugars become more labile.  However I do think we have to go back on as he is having significant more swallowing difficulty and go on a slower taper.  So I will give him a single dose of Decadron here we will put him back on a Medrol Dosepak.  He is  stable for transfer whenever bed becomes available and I would recommend a 1 to 2 weeks slow taper on steroids with construct glucose monitoring and control, x-rays look good with good alignment positioning of the implants.  LOS: 7 days     Mariam Dollar 04/30/2022, 11:52 AM

## 2022-04-30 NOTE — TOC Progression Note (Addendum)
Transition of Care [facility]) - Progression Note    Patient Details  Name: Bryan Blair MRN: [id number] Date of Birth: November 18, 1955  Transition of Care [facility]) CM/SW Contact  Beckie Busing, RN Phone Number:[phone]  04/30/2022, 2:30 PM  Clinical Narrative:    CM received call from [facility] liaison for [facility]. Per Victorino Dike the patients insurance is offering a a peer to peer. Victorino Dike requesting MD number for contact. CM provided Victorino Dike with MD number.    Expected Discharge Plan: Skilled Nursing Facility Barriers to Discharge: Continued Medical Work up, English as a second language teacher  Expected Discharge Plan and Services Expected Discharge Plan: Skilled Nursing Facility     Post Acute Care Choice: Skilled Nursing Facility Living arrangements for the past 2 months: Single Family Home                                       Social Determinants of Health (SDOH) Interventions    Readmission Risk Interventions     No data to display

## 2022-05-01 LAB — GLUCOSE, CAPILLARY
Glucose-Capillary: 233 mg/dL — ABNORMAL HIGH (ref 70–99)
Glucose-Capillary: 198 mg/dL — ABNORMAL HIGH (ref 70–99)
Glucose-Capillary: 164 mg/dL — ABNORMAL HIGH (ref 70–99)
Glucose-Capillary: 249 mg/dL — ABNORMAL HIGH (ref 70–99)

## 2022-05-01 NOTE — Progress Notes (Signed)
OT Cancellation Note  Patient Details Name: Jerian Morais MRN: [id number] DOB: 07/07/1956   Cancelled Treatment:    Reason Eval/Treat Not Completed: Fatigue/lethargy limiting ability to participate;Other (comment) Pt reports having just woken up declining OT session at this time, will check back as time allows for OT intervention.  Lenor Derrick., COTA/L Acute Rehabilitation Services [phone]  Barron Schmid 05/01/2022, 9:03 AM

## 2022-05-01 NOTE — Progress Notes (Signed)
Occupational Therapy Treatment Patient Details Name: Bryan Blair MRN: [id number] DOB: 1956/09/14 Today's Date: 05/01/2022   History of present illness Pt is a 66 y.o male s/p ACDF C3-6 on 04/23/2022. Hospital course complicated by fall on 7/20. PMH significant for BPH, COPD, arthritis, coronary artery disease, legally blind, sleep apnea, STEMI, DMII, carpal tunnel release, neuropathy, and PVC.   OT comments  Pt making steady progress towards OT goals this session. Pt continues to present with increased pain, impaired FMC in BUEs ( L worse than R), and generalized deconditioning . Session focus on increasing overall activity tolerance, BADL reeducation, and progressing functional mobility. Pt currently requires supervision for 3/3 toileting tasks, ADL transfers with RW and grooming tasks at sink. 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 Pt has been using theraputty and squeeze ball previous issued by OT and reports improvements in intrinsic strength. Pt would continue to benefit from skilled occupational therapy while admitted and after d/c to address the below listed limitations in order to improve overall functional mobility and facilitate independence with BADL participation. DC plan remains appropriate, will follow acutely per POC.      Recommendations for follow up therapy are one component of a multi-disciplinary discharge planning process, led by the attending physician.  Recommendations may be updated based on patient status, additional functional criteria and insurance authorization.    Follow Up Recommendations  Acute inpatient rehab (3hours/day)    Assistance Recommended at Discharge Intermittent Supervision/Assistance  Patient can return home with the following  A little help with walking and/or transfers;A little help with  bathing/dressing/bathroom;Assistance with cooking/housework;Assist for transportation   Equipment Recommendations  Other (comment) (defer)    Recommendations for Other Services      Precautions / Restrictions Precautions Precautions: Cervical Precaution Booklet Issued: Yes (comment) Precaution Comments: reviewed cervical precautions during session Required Braces or Orthoses: Cervical Brace Cervical Brace: Soft collar Restrictions Weight Bearing Restrictions: No       Mobility Bed Mobility               General bed mobility comments: seated EOB upon arrival    Transfers Overall transfer level: Needs assistance Equipment used: Rolling walker (2 wheels) Transfers: Sit to/from Stand Sit to Stand: Supervision           General transfer comment: supervision for safety. mildly impulsive but steady     Balance Overall balance assessment: Needs assistance Sitting-balance support: Feet supported, No upper extremity supported Sitting balance-Leahy Scale: Good     Standing balance support: During functional activity, No upper extremity supported Standing balance-Leahy Scale: Fair Standing balance comment: standing at sink for ADLs with no UE support or LOB                           ADL either performed or assessed with clinical judgement   ADL Overall ADL's : Needs assistance/impaired     Grooming: Wash/dry hands;Supervision/safety;Standing                   Toilet Transfer: Supervision/safety;Ambulation;Rolling walker (2 wheels) Toilet Transfer Details (indicate cue type and reason): supervision for safety with RW Toileting- Clothing Manipulation and Hygiene: Supervision/safety;Sit to/from stand       Functional mobility during ADLs: Supervision/safety;Rolling walker (2 wheels) General ADL Comments: pt continues to present with increased pain, impaired BUE [facility]  and decresaed AROM in BUEs    Extremity/Trunk Assessment Upper Extremity  Assessment Upper Extremity Assessment: RUE deficits/detail;LUE deficits/detail RUE Deficits / Details: decreased strength BUE/ decreased AROM RUE Sensation: decreased light touch;decreased proprioception RUE Coordination: decreased fine motor;decreased gross motor LUE Deficits / Details: decreased strength BUE/ impaired ability to oppose thumb and pinky, impaired sensation and FMC but pt reports improvement since using putty and squeeze ball LUE Sensation: decreased light touch;decreased proprioception LUE Coordination: decreased fine motor;decreased gross motor   Lower Extremity Assessment Lower Extremity Assessment: Defer to PT evaluation   Cervical / Trunk Assessment Cervical / Trunk Assessment: Neck Surgery    Vision Baseline Vision/History: 2 Legally blind;1 Wears glasses Ability to See in Adequate Light: 1 Impaired Patient Visual Report: No change from baseline     Perception Perception Perception: Not tested   Praxis Praxis Praxis: Not tested    Cognition Arousal/Alertness: Awake/alert Behavior During Therapy: Impulsive (mildy) Overall Cognitive Status: No family/caregiver present to determine baseline cognitive functioning                                 General Comments: very inquisitive about rehab and asking approrpiate questions about rehab/recovery        Exercises      Shoulder Instructions       General Comments VSS on RA, appropriate questions about using TENS unit for pain, NMR in rehab process    Pertinent Vitals/ Pain       Pain Assessment Pain Assessment: Faces Faces Pain Scale: Hurts little more Pain Location: bil shoulders Pain Descriptors / Indicators: Discomfort, Grimacing, Sore Pain Intervention(s): Monitored during session  Home Living                                          Prior Functioning/Environment              Frequency  Min 2X/week        Progress Toward Goals  OT Goals(current  goals can now be found in the care plan section)  Progress towards OT goals: Progressing toward goals  Acute Rehab OT Goals Patient Stated Goal: go to rehab OT Goal Formulation: With patient Time For Goal Achievement: 05/08/22 Potential to Achieve Goals: Good  Plan Discharge plan remains appropriate;Frequency remains appropriate    Co-evaluation                 AM-PAC OT "6 Clicks" Daily Activity     Outcome Measure   Help from another person eating meals?: None Help from another person taking care of personal grooming?: None Help from another person toileting, which includes using toliet, bedpan, or urinal?: A Little Help from another person bathing (including washing, rinsing, drying)?: A Little Help from another person to put on and taking off regular upper body clothing?: A Little Help from another person to put on and taking off regular lower body clothing?: A Little 6 Click Score: 20    End of Session Equipment Utilized During Treatment: Rolling walker (2 wheels);Cervical collar  OT Visit Diagnosis: Unsteadiness on feet (R26.81);Muscle weakness (generalized) (M62.81);History of falling (Z91.81);Low vision, both eyes (H54.2);Feeding difficulties (R63.3);Pain Pain - Right/Left:  (neck) Pain - part of body:  (neck)   Activity Tolerance Patient tolerated treatment well   Patient Left in bed;with call bell/phone within reach  Nurse Communication Mobility status        Time: [phone] OT Time Calculation (min): 19 min  Charges: OT General Charges $OT Visit: 1 Visit OT Treatments $Self Care/Home Management : 8-22 mins  Lenor Derrick., COTA/L Acute Rehabilitation Services [phone]   Barron Schmid 05/01/2022, 2:16 PM

## 2022-05-01 NOTE — Progress Notes (Deleted)
Patient  accidentally pulled out wound vac sitting the chair: vac unable to suction due to leak. Reinforced clear wrap

## 2022-05-01 NOTE — Progress Notes (Signed)
Subjective: Patient reports moderate neck pain but swallowing much better.   Objective: Vital signs in last 24 hours: Temp:  [97.6 F (36.4 C)-98.9 F (37.2 C)] 97.6 F (36.4 C) (07/25 0734) Pulse Rate:  [57-87] 57 (07/25 0734) Resp:  [15-20] 15 (07/25 0734) BP: (146-160)/(72-87) 160/87 (07/25 0734) SpO2:  [92 %-98 %] 95 % (07/25 0734)  Intake/Output from previous day: 07/24 0701 - 07/25 0700 In: 120 [P.O.:120] Out: 550 [Urine:550] Intake/Output this shift: No intake/output data recorded.  Neurologic: Grossly normal  Lab Results: Lab Results  Component Value Date   WBC 10.7 (H) 04/20/2022   HGB 14.6 04/20/2022   HCT 44.0 04/20/2022   MCV 94.0 04/20/2022   PLT 222 04/20/2022   Lab Results  Component Value Date   INR 0.9 04/20/2022   BMET Lab Results  Component Value Date   NA 138 04/29/2022   K 3.7 04/29/2022   CL 101 04/29/2022   CO2 29 04/29/2022   GLUCOSE 168 (H) 04/29/2022   BUN 16 04/29/2022   CREATININE 0.94 04/29/2022   CALCIUM 9.5 04/29/2022    Studies/Results: DG Cervical Spine 2 or 3 views  Result Date: 04/29/2022 CLINICAL DATA:  Dysphasia.  Sore throat. EXAM: CERVICAL SPINE - 2-3 VIEW COMPARISON:  None FINDINGS: There are postsurgical changes identified from recent anterior sideplate and interbody fusion of C3 through C6. Hardware in anatomic alignment. Nonspecific prevertebral soft tissue edema is noted which is likely postoperative. Airway appears patent. IMPRESSION: 1. Status post recent anterior sideplate and interbody fusion of C3 through C6. 2. Nonspecific, postoperative prevertebral soft tissue edema with patent airway. Electronically Signed   By: Signa Kell M.D.   On: 04/29/2022 12:37    Assessment/Plan: S/p acdf with some dysphagia issues improving on steroids. Awaiting rehab placement.    LOS: 8 days    Tiana Loft Hodges Treiber 05/01/2022, 8:03 AM

## 2022-05-02 LAB — GLUCOSE, CAPILLARY
Glucose-Capillary: 233 mg/dL — ABNORMAL HIGH (ref 70–99)
Glucose-Capillary: 137 mg/dL — ABNORMAL HIGH (ref 70–99)
Glucose-Capillary: 179 mg/dL — ABNORMAL HIGH (ref 70–99)
Glucose-Capillary: 217 mg/dL — ABNORMAL HIGH (ref 70–99)

## 2022-05-02 NOTE — Progress Notes (Signed)
Occupational Therapy Treatment Patient Details Name: Bryan Blair MRN: [id number] DOB: 08-Jun-1956 Today's Date: 05/02/2022   History of present illness Pt is a 66 y.o male s/p ACDF C3-6 on 04/23/2022. Hospital course complicated by fall on 7/20. PMH significant for BPH, COPD, arthritis, coronary artery disease, legally blind, sleep apnea, STEMI, DMII, carpal tunnel release, neuropathy, and PVC.   OT comments  Pt making good progress toward all adl and strengthening goals. Pt provided with button hook and zipper pull as well as bathing mitt to increase independence with basic dressing and bathing. Pt lives alone and continue to feel pt would benefit from inpatient rehab to address activity tolerance and home skills such as cooking, cleaning, safety with bathing and general mobility to avoid falls and a readmit if d/c'd home now.  Continue to see with focus on adl independence.     Recommendations for follow up therapy are one component of a multi-disciplinary discharge planning process, led by the attending physician.  Recommendations may be updated based on patient status, additional functional criteria and insurance authorization.    Follow Up Recommendations  Acute inpatient rehab (3hours/day)    Assistance Recommended at Discharge Intermittent Supervision/Assistance  Patient can return home with the following  A little help with walking and/or transfers;A little help with bathing/dressing/bathroom;Assistance with cooking/housework;Assist for transportation   Equipment Recommendations  Tub/shower seat    Recommendations for Other Services      Precautions / Restrictions Precautions Precautions: Cervical Precaution Comments: reviewed cervical precautions during session Required Braces or Orthoses: Cervical Brace Cervical Brace: Soft collar Restrictions Weight Bearing Restrictions: No       Mobility Bed Mobility               General bed mobility comments: seated EOB  upon arrival    Transfers Overall transfer level: Needs assistance Equipment used: Rolling walker (2 wheels) Transfers: Sit to/from Stand Sit to Stand: Supervision           General transfer comment: Cues for hand placement     Balance Overall balance assessment: Needs assistance Sitting-balance support: Feet supported, No upper extremity supported Sitting balance-Leahy Scale: Good     Standing balance support: During functional activity, No upper extremity supported Standing balance-Leahy Scale: Fair Standing balance comment: standing at sink for ADLs with no UE support or LOB                           ADL either performed or assessed with clinical judgement   ADL Overall ADL's : Needs assistance/impaired Eating/Feeding: Set up;Sitting Eating/Feeding Details (indicate cue type and reason): Pt has difficulty opening small packages like sugar and ketchup packs. Grooming: Wash/dry hands;Wash/dry face;Oral care;Supervision/safety;Standing Grooming Details (indicate cue type and reason): Pt requires a rest break when standing for more than 2 minutes. Pt drops soap and adl objects frequently into the sink. Upper Body Bathing: Minimal assistance;Sitting Upper Body Bathing Details (indicate cue type and reason): Pt drops soap and washcloth. Discussed adaptive equipment options. Lower Body Bathing: Minimal assistance;Sit to/from stand;Cueing for compensatory techniques Lower Body Bathing Details (indicate cue type and reason): discussed adaptive equipment options Upper Body Dressing : Sitting;Minimal assistance Upper Body Dressing Details (indicate cue type and reason): min assist for brace placement and for buttons.  Introduced pt to button hooker and zipper pull to assist with dressing Lower Body Dressing: Minimal assistance;Sit to/from stand;Cueing for safety;With adaptive equipment Lower Body Dressing Details (indicate cue type and reason): Pt  continues to get fatigued  when dressing.  Zipper pull and button hook are helpful.  LE dressing takes increased amount of effort and time. Toilet Transfer: Supervision/safety;Ambulation;Rolling walker (2 wheels) Toilet Transfer Details (indicate cue type and reason): cues to slow down and take his time. Discuessed reprecussions of falling at this point and stressed the need to take his time when ambulating. Toileting- Clothing Manipulation and Hygiene: Supervision/safety;Sit to/from stand       Functional mobility during ADLs: Supervision/safety;Rolling walker (2 wheels) General ADL Comments: Pt continues to have weakness in hands and arms.  Pt provided with coins and beads to simulate meds.  Coins put inside theraputty for strengthening exercises and pt completed pincer grasp tasks with beads to simulate pills.  Instructed pt to pour a few pills on a towel at home and pick one up at a time to take them and then put rest back in container.  Talked to pt about not putting caps on tightly at home (since no children around) to assist with getting pills out of the containers.    Extremity/Trunk Assessment Upper Extremity Assessment Upper Extremity Assessment: RUE deficits/detail;LUE deficits/detail RUE Deficits / Details: decreased strength BUE/ decreased AROM RUE Sensation: decreased light touch;decreased proprioception RUE Coordination: decreased fine motor;decreased gross motor LUE Deficits / Details: decreased strength BUE/ impaired ability to oppose thumb and pinky, impaired sensation and FMC but pt reports improvement since using putty and squeeze ball LUE Sensation: decreased light touch;decreased proprioception LUE Coordination: decreased fine motor;decreased gross motor   Lower Extremity Assessment Lower Extremity Assessment: Defer to PT evaluation        Vision   Vision Assessment?:  (Pt born legally blind) Additional Comments: Pt born legally blind. Pt with special glasses with monolens to assist with  vision.   Perception Perception Perception: Not tested   Praxis Praxis Praxis: Intact    Cognition Arousal/Alertness: Awake/alert Behavior During Therapy: Impulsive (Pt's basic personality is impulsive.  Not cognitively impaired) Overall Cognitive Status: No family/caregiver present to determine baseline cognitive functioning Area of Impairment: Safety/judgement                         Safety/Judgement: Decreased awareness of safety     General Comments: Pt continues to be very motivated and exicted about the prospect of rehab.  Feel it would be a great transition to home to figure out home skills and become safer.        Exercises Other Exercises Other Exercises: Theraputty exercises with coins completed Other Exercises: Pincer grasp exercies with beads completed with RUE and LUE    Shoulder Instructions       General Comments Pt most limited with fine motor skills, UE strength and endurance and activity tolerate.  Feel rehab would be a good option since pt lives alone. Feel pt will gain the independence needed from rehab to return home alone safely.    Pertinent Vitals/ Pain       Pain Assessment Pain Assessment: Faces Faces Pain Scale: Hurts a little bit Pain Location: bil shoulders Pain Descriptors / Indicators: Discomfort, Grimacing, Sore Pain Intervention(s): Premedicated before session, Repositioned  Home Living                                          Prior Functioning/Environment  Frequency  Min 2X/week        Progress Toward Goals  OT Goals(current goals can now be found in the care plan section)  Progress towards OT goals: Progressing toward goals  Acute Rehab OT Goals Patient Stated Goal: to go to rehab and become independent. OT Goal Formulation: With patient Time For Goal Achievement: 05/08/22 Potential to Achieve Goals: Good ADL Goals Pt Will Perform Upper Body Dressing: with modified  independence;sitting Pt Will Perform Lower Body Dressing: with modified independence;sit to/from stand;with adaptive equipment Pt Will Transfer to Toilet: with modified independence;ambulating Pt/caregiver will Perform Home Exercise Program: Increased strength;Both right and left upper extremity;With written HEP provided  Plan Discharge plan remains appropriate;Frequency remains appropriate    Co-evaluation                 AM-PAC OT "6 Clicks" Daily Activity     Outcome Measure   Help from another person eating meals?: A Little Help from another person taking care of personal grooming?: A Little Help from another person toileting, which includes using toliet, bedpan, or urinal?: A Little Help from another person bathing (including washing, rinsing, drying)?: A Little Help from another person to put on and taking off regular upper body clothing?: A Little Help from another person to put on and taking off regular lower body clothing?: A Little 6 Click Score: 18    End of Session Equipment Utilized During Treatment: Rolling walker (2 wheels);Cervical collar  OT Visit Diagnosis: Unsteadiness on feet (R26.81);Muscle weakness (generalized) (M62.81);History of falling (Z91.81);Low vision, both eyes (H54.2);Feeding difficulties (R63.3);Pain Pain - part of body: Shoulder   Activity Tolerance Patient tolerated treatment well   Patient Left in chair;with call bell/phone within reach   Nurse Communication Mobility status        Time: [id number] OT Time Calculation (min): 32 min  Charges: OT General Charges $OT Visit: 1 Visit OT Treatments $Self Care/Home Management : 8-22 mins $Therapeutic Exercise: 8-22 mins   Glenford Peers 05/02/2022, 9:14 AM

## 2022-05-02 NOTE — TOC Progression Note (Addendum)
Transition of Care [facility]) - Progression Note    Patient Details  Name: Bryan Blair MRN: [id number] Date of Birth: 10-Dec-1955  Transition of Care [facility]) CM/SW Contact  Beckie Busing, RN Phone [phone]  05/02/2022, 8:52 AM  Clinical Narrative:    TOC continues to follow. CM called Maryagnes Amos with [facility] to follow up on status of the peer to peer. Per Edwyna Shell is still pending. Victorino Dike states that she will call CM as soon as she has an update.   1517 CM received message from Maryagnes Amos at [place] stating that [name] has received a denial letter and that he has already initiated a fast appeal and should have an answer in 24-48 hours. Victorino Dike states that she will update CM with updates as soon as available.    Expected Discharge Plan: Skilled Nursing Facility Barriers to Discharge: Continued Medical Work up, English as a second language teacher  Expected Discharge Plan and Services Expected Discharge Plan: Skilled Nursing Facility     Post Acute Care Choice: Skilled Nursing Facility Living arrangements for the past 2 months: Single Family Home                                       Social Determinants of Health (SDOH) Interventions    Readmission Risk Interventions     No data to display

## 2022-05-02 NOTE — Progress Notes (Signed)
PT Cancellation Note  Patient Details Name: Bryan Blair MRN: [id number] DOB: [birth year]   Cancelled Treatment:    Reason Eval/Treat Not Completed: Other (comment); attempted twice to see pt, first was working with SW on appeal for post-acute rehab, second pt was in the shower.  Will continue attempts.    Elray Mcgregor 05/02/2022, 4:36 PM Sheran Lawless, PT Acute Rehabilitation Services [phone] 05/02/2022

## 2022-05-02 NOTE — Progress Notes (Signed)
Mobility Specialist Progress Note  Received pt EOB stating to be dealing with an appeal for certain services and requesting for MS to come back later. Will f/u later this evening if time permits.  Frederico Hamman Mobility Specialist MS [facility] #:  [phone] Acute Rehab Office:  [phone]

## 2022-05-02 NOTE — Progress Notes (Signed)
Subjective: Patient reports some scratchiness in his throat but overall still swallowing ok.   Objective: Vital signs in last 24 hours: Temp:  [97.6 F (36.4 C)-99 F (37.2 C)] 98.1 F (36.7 C) (07/26 1108) Pulse Rate:  [58-71] 71 (07/26 1108) Resp:  [15-19] 15 (07/26 1108) BP: (141-180)/(63-86) 142/82 (07/26 1108) SpO2:  [92 %-95 %] 95 % (07/26 1108)  Intake/Output from previous day: 07/25 0701 - 07/26 0700 In: 600 [P.O.:600] Out: -  Intake/Output this shift: Total I/O In: 480 [P.O.:480] Out: -   Neurologic: Grossly normal  Lab Results: Lab Results  Component Value Date   WBC 10.7 (H) 04/20/2022   HGB 14.6 04/20/2022   HCT 44.0 04/20/2022   MCV 94.0 04/20/2022   PLT 222 04/20/2022   Lab Results  Component Value Date   INR 0.9 04/20/2022   BMET Lab Results  Component Value Date   NA 138 04/29/2022   K 3.7 04/29/2022   CL 101 04/29/2022   CO2 29 04/29/2022   GLUCOSE 168 (H) 04/29/2022   BUN 16 04/29/2022   CREATININE 0.94 04/29/2022   CALCIUM 9.5 04/29/2022    Studies/Results: No results found.  Assessment/Plan: S/p acdf, swallowing improving on steroids. Continue therapy. Awaiting placement for SNF.    LOS: 9 days    Tiana Loft [facility] 05/02/2022, 12:19 PM

## 2022-05-03 LAB — GLUCOSE, CAPILLARY
Glucose-Capillary: 275 mg/dL — ABNORMAL HIGH (ref 70–99)
Glucose-Capillary: 155 mg/dL — ABNORMAL HIGH (ref 70–99)
Glucose-Capillary: 131 mg/dL — ABNORMAL HIGH (ref 70–99)
Glucose-Capillary: 239 mg/dL — ABNORMAL HIGH (ref 70–99)

## 2022-05-03 NOTE — TOC Progression Note (Signed)
Transition of Care [facility]) - Progression Note    Patient Details  Name: Bryan Blair MRN: [id number] Date of Birth: 02/20/1956  Transition of Care [facility]) CM/SW [place], [place] Phone Number: 05/03/2022, 11:12 AM  Clinical Narrative:     CSW met with patient at bedside. CSW introduced self and explained role.CSW discussed short term rehab at SNF as back up plan. Patient is hopeful that denial for inpatient rehab will be over turned, however, if not he is agreeable to [facility].  TOC will continue to following and assist with discharge planning.  Thurmond Butts, MSW, LCSW Clinical Social Worker    Expected Discharge Plan: Skilled Nursing Facility Barriers to Discharge: Continued Medical Work up, Ship broker  Expected Discharge Plan and Services Expected Discharge Plan: [place] Choice: [place] arrangements for the past 2 months: Single Family Home                                       Social Determinants of Health (SDOH) Interventions    Readmission Risk Interventions     No data to display

## 2022-05-03 NOTE — Progress Notes (Signed)
Subjective: Patient reports doing well, no dysphagia issues  Objective: Vital signs in last 24 hours: Temp:  [97.8 F (36.6 C)-99.1 F (37.3 C)] 97.8 F (36.6 C) (07/27 0741) Pulse Rate:  [60-86] 84 (07/27 0741) Resp:  [15-19] 16 (07/27 0741) BP: (119-178)/(78-96) 149/96 (07/27 0741) SpO2:  [93 %-97 %] 94 % (07/27 0741)  Intake/Output from previous day: 07/26 0701 - 07/27 0700 In: 960 [P.O.:960] Out: -  Intake/Output this shift: No intake/output data recorded.  Neurologic: Grossly normal  Lab Results: Lab Results  Component Value Date   WBC 10.7 (H) 04/20/2022   HGB 14.6 04/20/2022   HCT 44.0 04/20/2022   MCV 94.0 04/20/2022   PLT 222 04/20/2022   Lab Results  Component Value Date   INR 0.9 04/20/2022   BMET Lab Results  Component Value Date   NA 138 04/29/2022   K 3.7 04/29/2022   CL 101 04/29/2022   CO2 29 04/29/2022   GLUCOSE 168 (H) 04/29/2022   BUN 16 04/29/2022   CREATININE 0.94 04/29/2022   CALCIUM 9.5 04/29/2022    Studies/Results: No results found.  Assessment/Plan: Postop day 10 ACDF, still awaiting SNF placement. Insurance pending. Continue therapy and pain control.   LOS: 10 days    Tiana Loft [facility] 05/03/2022, 7:50 AM

## 2022-05-03 NOTE — TOC Progression Note (Signed)
Transition of Care [facility]) - Progression Note    Patient Details  Name: Beckem Tomberlin MRN: [id number] Date of Birth: January 04, 1956  Transition of Care [facility]) CM/SW Contact  Beckie Busing, RN Phone Number:[phone]  05/03/2022, 9:40 AM  Clinical Narrative:    CM at bedside per patients request to see a member of the [facility] team. Patient states that he is in the appeal process for insurance denial of inpatient rehab. Per patient he feels that he can not go home in his current condition. Patient states that lives alone and is unable to care for himself without 24 hour assistance which he doesn't have. Patient states that he understands that only 3 facilities are in network for his insurance for short term rehab and he really doesn't want either of those. Patient is questioning what the plan would be. CM has explained that patient would need to wait for the outcome of insurance appeal but would also need a backup plan for placement. CM has made social worker aware. SW will follow up with patient.    Expected Discharge Plan: Skilled Nursing Facility Barriers to Discharge: Continued Medical Work up, English as a second language teacher  Expected Discharge Plan and Services Expected Discharge Plan: Skilled Nursing Facility     Post Acute Care Choice: Skilled Nursing Facility Living arrangements for the past 2 months: Single Family Home                                       Social Determinants of Health (SDOH) Interventions    Readmission Risk Interventions     No data to display

## 2022-05-03 NOTE — Progress Notes (Signed)
Physical Therapy Treatment Patient Details Name: Bryan Blair MRN: [id number] DOB: Aug 23, 1956 Today's Date: 05/03/2022   History of Present Illness Pt is a 66 y.o male s/p ACDF C3-6 on 04/23/2022. Hospital course complicated by fall on 7/20. PMH significant for BPH, COPD, arthritis, coronary artery disease, legally blind, sleep apnea, STEMI, DMII, carpal tunnel release, neuropathy, and PVC.    PT Comments    Pt had just mobilized in hallway upon PT arrival to room, PT focusing session on LE strengthening. Pt tolerated LE exercises well, demonstrates L>R weakness and requires cues for slow and controlled motion during exercises. Pt remains motivated to progress mobility.     Recommendations for follow up therapy are one component of a multi-disciplinary discharge planning process, led by the attending physician.  Recommendations may be updated based on patient status, additional functional criteria and insurance authorization.  Follow Up Recommendations  Acute inpatient rehab (3hours/day)     Assistance Recommended at Discharge Intermittent Supervision/Assistance  Patient can return home with the following A little help with walking and/or transfers;A little help with bathing/dressing/bathroom;Assistance with cooking/housework;Assist for transportation;Help with stairs or ramp for entrance   Equipment Recommendations  None recommended by PT    Recommendations for Other Services       Precautions / Restrictions Precautions Precautions: Cervical Required Braces or Orthoses: Cervical Brace Cervical Brace: Soft collar Restrictions Weight Bearing Restrictions: No     Mobility  Bed Mobility               General bed mobility comments: up in chair    Transfers Overall transfer level: Needs assistance Equipment used: Rolling walker (2 wheels) Transfers: Sit to/from Stand Sit to Stand: Supervision           General transfer comment: Cues for hand placement     Ambulation/Gait               General Gait Details: pt had just walked when PT arrived to room, focused session on strengthening exercises   Stairs             Wheelchair Mobility    Modified Rankin (Stroke Patients Only)       Balance Overall balance assessment: Needs assistance Sitting-balance support: Feet supported, No upper extremity supported Sitting balance-Leahy Scale: Good     Standing balance support: During functional activity, No upper extremity supported Standing balance-Leahy Scale: Fair                              Cognition Arousal/Alertness: Awake/alert Behavior During Therapy: Impulsive (Pt's basic personality is impulsive.  Not cognitively impaired) Overall Cognitive Status: No family/caregiver present to determine baseline cognitive functioning Area of Impairment: Safety/judgement                         Safety/Judgement: Decreased awareness of safety              Exercises General Exercises - Lower Extremity Long Arc Quad: AROM, Both, 10 reps, Seated Other Exercises Other Exercises: sit<>stands x12 Other Exercises: anterior box taps with foot, x10 bilat, use of RW for steadying Other Exercises: standing hip abduction x10 bilat    General Comments        Pertinent Vitals/Pain Pain Assessment Pain Assessment: No/denies pain    Home Living  Prior Function            PT Goals (current goals can now be found in the care plan section) Acute Rehab PT Goals Patient Stated Goal: to get further rehab before returning home alone PT Goal Formulation: With patient Time For Goal Achievement: 05/08/22 Progress towards PT goals: Progressing toward goals    Frequency    Min 3X/week      PT Plan Current plan remains appropriate    Co-evaluation              AM-PAC PT "6 Clicks" Mobility   Outcome Measure  Help needed turning from your back to your side  while in a flat bed without using bedrails?: A Little Help needed moving from lying on your back to sitting on the side of a flat bed without using bedrails?: A Little Help needed moving to and from a bed to a chair (including a wheelchair)?: A Little Help needed standing up from a chair using your arms (e.g., wheelchair or bedside chair)?: A Little Help needed to walk in hospital room?: A Little Help needed climbing 3-5 steps with a railing? : A Little 6 Click Score: 18    End of Session Equipment Utilized During Treatment: Cervical collar Activity Tolerance: Patient tolerated treatment well Patient left: in chair;with call bell/phone within reach Nurse Communication: Mobility status PT Visit Diagnosis: Repeated falls (R29.6);Muscle weakness (generalized) (M62.81);Difficulty in walking, not elsewhere classified (R26.2)     Time: 3785-8850 PT Time Calculation (min) (ACUTE ONLY): 15 min  Charges:  $Therapeutic Exercise: 8-22 mins                    Marye Round, PT DPT Acute Rehabilitation Services Pager [phone]  Office [phone]    Truddie Coco 05/03/2022, 4:42 PM

## 2022-05-03 NOTE — Progress Notes (Signed)
Mobility Specialist Progress Note   05/03/22 1432  Mobility  Activity Ambulated with assistance in hallway  Level of Assistance Contact guard assist, steadying assist  Assistive Device Front wheel walker  Distance Ambulated (ft) 200 ft  Activity Response Tolerated well  $Mobility charge 1 Mobility   Pt received in bed having no complaints and agreeable. Once out in hallway pt having unsteady gate and acknowledging unsteadiness. With ambulation progression unsteadiness decreased and pt able to return back to room w/o fault. Left in chair having no further complaints, call bell in reach and all needs met.   Holland Falling Mobility Specialist MS [facility] #:  [phone] Acute Rehab Office:  [phone]

## 2022-05-04 DIAGNOSIS — J449 Chronic obstructive pulmonary disease, unspecified: Secondary | ICD-10-CM | POA: Diagnosis not present

## 2022-05-04 DIAGNOSIS — G959 Disease of spinal cord, unspecified: Secondary | ICD-10-CM | POA: Diagnosis not present

## 2022-05-04 DIAGNOSIS — R1310 Dysphagia, unspecified: Secondary | ICD-10-CM | POA: Diagnosis not present

## 2022-05-04 DIAGNOSIS — R519 Headache, unspecified: Secondary | ICD-10-CM | POA: Diagnosis not present

## 2022-05-04 DIAGNOSIS — M4802 Spinal stenosis, cervical region: Secondary | ICD-10-CM | POA: Diagnosis not present

## 2022-05-04 DIAGNOSIS — E119 Type 2 diabetes mellitus without complications: Secondary | ICD-10-CM | POA: Diagnosis not present

## 2022-05-04 DIAGNOSIS — Z6841 Body Mass Index (BMI) 40.0 and over, adult: Secondary | ICD-10-CM | POA: Diagnosis not present

## 2022-05-04 DIAGNOSIS — G992 Myelopathy in diseases classified elsewhere: Secondary | ICD-10-CM | POA: Diagnosis not present

## 2022-05-04 DIAGNOSIS — I25118 Atherosclerotic heart disease of native coronary artery with other forms of angina pectoris: Secondary | ICD-10-CM | POA: Diagnosis not present

## 2022-05-04 DIAGNOSIS — I10 Essential (primary) hypertension: Secondary | ICD-10-CM | POA: Diagnosis not present

## 2022-05-04 DIAGNOSIS — M5412 Radiculopathy, cervical region: Secondary | ICD-10-CM | POA: Diagnosis not present

## 2022-05-04 DIAGNOSIS — R531 Weakness: Secondary | ICD-10-CM | POA: Diagnosis not present

## 2022-05-04 DIAGNOSIS — N400 Enlarged prostate without lower urinary tract symptoms: Secondary | ICD-10-CM | POA: Diagnosis not present

## 2022-05-04 DIAGNOSIS — Z981 Arthrodesis status: Secondary | ICD-10-CM | POA: Diagnosis not present

## 2022-05-04 DIAGNOSIS — G629 Polyneuropathy, unspecified: Secondary | ICD-10-CM | POA: Diagnosis not present

## 2022-05-04 DIAGNOSIS — E1165 Type 2 diabetes mellitus with hyperglycemia: Secondary | ICD-10-CM | POA: Diagnosis not present

## 2022-05-04 DIAGNOSIS — Z4789 Encounter for other orthopedic aftercare: Secondary | ICD-10-CM | POA: Diagnosis not present

## 2022-05-04 DIAGNOSIS — H548 Legal blindness, as defined in USA: Secondary | ICD-10-CM | POA: Diagnosis not present

## 2022-05-04 DIAGNOSIS — E1169 Type 2 diabetes mellitus with other specified complication: Secondary | ICD-10-CM | POA: Diagnosis not present

## 2022-05-04 DIAGNOSIS — I2510 Atherosclerotic heart disease of native coronary artery without angina pectoris: Secondary | ICD-10-CM | POA: Diagnosis not present

## 2022-05-04 DIAGNOSIS — Z743 Need for continuous supervision: Secondary | ICD-10-CM | POA: Diagnosis not present

## 2022-05-04 DIAGNOSIS — R5381 Other malaise: Secondary | ICD-10-CM | POA: Diagnosis not present

## 2022-05-04 DIAGNOSIS — L0231 Cutaneous abscess of buttock: Secondary | ICD-10-CM | POA: Diagnosis not present

## 2022-05-04 DIAGNOSIS — F17210 Nicotine dependence, cigarettes, uncomplicated: Secondary | ICD-10-CM | POA: Diagnosis not present

## 2022-05-04 DIAGNOSIS — L02429 Furuncle of limb, unspecified: Secondary | ICD-10-CM | POA: Diagnosis not present

## 2022-05-04 DIAGNOSIS — B9562 Methicillin resistant Staphylococcus aureus infection as the cause of diseases classified elsewhere: Secondary | ICD-10-CM | POA: Diagnosis not present

## 2022-05-04 DIAGNOSIS — R110 Nausea: Secondary | ICD-10-CM | POA: Diagnosis not present

## 2022-05-04 DIAGNOSIS — Z955 Presence of coronary angioplasty implant and graft: Secondary | ICD-10-CM | POA: Diagnosis not present

## 2022-05-04 LAB — GLUCOSE, CAPILLARY: Glucose-Capillary: 211 mg/dL — ABNORMAL HIGH (ref 70–99)

## 2022-05-04 MED ORDER — OXYCODONE HCL 5 MG PO TABS: 5.0000 mg | ORAL_TABLET | ORAL | 0 refills | Status: DC | PRN

## 2022-05-04 NOTE — Progress Notes (Signed)
Physical Therapy Treatment Patient Details Name: Bryan Blair MRN: [id number] DOB: Jun 24, 1956 Today's Date: 05/04/2022   History of Present Illness Pt is a 66 y.o male s/p ACDF C3-6 on 04/23/2022. Hospital course complicated by fall on 7/20. PMH significant for BPH, COPD, arthritis, coronary artery disease, legally blind, sleep apnea, STEMI, DMII, carpal tunnel release, neuropathy, and PVC.    PT Comments    The pt was agreeable to session, remains eager to improve current deficits in endurance, stability, and LLE strength. The pt was able to complete short bouts of hallway ambulation with use of RW and limited distance without RW, but then reaches for walls, rails, and furniture to steady with ambulation. Pt also tolerated a series of step-ups and repeated sit-stand transfers to improve functional LE strength and power, deficits remain in LLE at this time. Pt with frequent hyperextension and buckling in LLE that increase with fatigue. Will continue to benefit from skilled PT to progress safety and independence with mobility as well as functional strength in LLE to further reduce risk of falls.   Gait Speed: 0.17m/s using RW. (Gait speed < 1.42m/s indicates increased risk of falls)     Recommendations for follow up therapy are one component of a multi-disciplinary discharge planning process, led by the attending physician.  Recommendations may be updated based on patient status, additional functional criteria and insurance authorization.  Follow Up Recommendations  Acute inpatient rehab (3hours/day) Can patient physically be transported by private vehicle: Yes   Assistance Recommended at Discharge Intermittent Supervision/Assistance  Patient can return home with the following A little help with walking and/or transfers;A little help with bathing/dressing/bathroom;Assistance with cooking/housework;Assist for transportation;Help with stairs or ramp for entrance   Equipment Recommendations   None recommended by PT    Recommendations for Other Services       Precautions / Restrictions Precautions Precautions: Cervical Precaution Booklet Issued: Yes (comment) Precaution Comments: reviewed cervical precautions during session Required Braces or Orthoses: Cervical Brace Cervical Brace: Soft collar Restrictions Weight Bearing Restrictions: No     Mobility  Bed Mobility Overal bed mobility: Independent             General bed mobility comments: sitting at EOB    Transfers Overall transfer level: Needs assistance Equipment used: Rolling walker (2 wheels), None Transfers: Sit to/from Stand Sit to Stand: Supervision           General transfer comment: cues to avoid use of UE for exercise, completed x15 in session    Ambulation/Gait Ambulation/Gait assistance: Min guard, Min assist Gait Distance (Feet): 150 Feet (+ 150 ft) Assistive device: Rolling walker (2 wheels) Gait Pattern/deviations: Step-through pattern, Drifts right/left, Trunk flexed, Wide base of support Gait velocity: 0.71 m/s Gait velocity interpretation: 1.31 - 2.62 ft/sec, indicative of limited community ambulator   General Gait Details: mild impulsivity with turning, no overt LOB. pt fatigues quickly with HR to 105bpm   Stairs Stairs: Yes Stairs assistance: Min guard Stair Management: One rail Right, Step to pattern Number of Stairs: 5 (x8 step ups with RLE, x5 with LLE) General stair comments: x8 with RLE for strengthening, pt unable to complete more than x5 wiht LLE due to fatigue. hyperextends LLE       Balance Overall balance assessment: Needs assistance Sitting-balance support: Feet supported, No upper extremity supported Sitting balance-Leahy Scale: Good Sitting balance - Comments: good seated stability, reaching outside BOS   Standing balance support: During functional activity, No upper extremity supported Standing balance-Leahy Scale: Fair  Cognition Arousal/Alertness: Awake/alert Behavior During Therapy: Impulsive Overall Cognitive Status: No family/caregiver present to determine baseline cognitive functioning Area of Impairment: Safety/judgement                         Safety/Judgement: Decreased awareness of safety     General Comments: Pt continues to be very motivated and exicted about the prospect of rehab.  Feel it would be a great transition to home to figure out home skills and become safer.        Exercises Other Exercises Other Exercises: sit-stand 2 x 5 with RLE advanced to shift wt to LLE. no UE support Other Exercises: step ups to 8" stair x8 with RLE and x5 with LLE    General Comments General comments (skin integrity, edema, etc.): HR to 105bpm after exertion      Pertinent Vitals/Pain Pain Assessment Pain Assessment: Faces Faces Pain Scale: Hurts a little bit Pain Location: L knee with exercise and when hanging in dependent position, feels like knee joint "separates" when hanging dependently Pain Descriptors / Indicators: Discomfort, Grimacing, Sore Pain Intervention(s): Limited activity within patient's tolerance, Monitored during session, Repositioned     PT Goals (current goals can now be found in the care plan section) Acute Rehab PT Goals Patient Stated Goal: to get further rehab before returning home alone PT Goal Formulation: With patient Time For Goal Achievement: 05/08/22 Potential to Achieve Goals: Good Progress towards PT goals: Progressing toward goals    Frequency    Min 3X/week      PT Plan Current plan remains appropriate       AM-PAC PT "6 Clicks" Mobility   Outcome Measure  Help needed turning from your back to your side while in a flat bed without using bedrails?: A Little Help needed moving from lying on your back to sitting on the side of a flat bed without using bedrails?: A Little Help needed moving to and from a bed to a chair  (including a wheelchair)?: A Little Help needed standing up from a chair using your arms (e.g., wheelchair or bedside chair)?: A Little Help needed to walk in hospital room?: A Little Help needed climbing 3-5 steps with a railing? : A Little 6 Click Score: 18    End of Session Equipment Utilized During Treatment: Cervical collar Activity Tolerance: Patient tolerated treatment well Patient left: with call bell/phone within reach;in bed (sitting EOB) Nurse Communication: Mobility status PT Visit Diagnosis: Repeated falls (R29.6);Muscle weakness (generalized) (M62.81);Difficulty in walking, not elsewhere classified (R26.2)     Time: 4431-5400 PT Time Calculation (min) (ACUTE ONLY): 19 min  Charges:  $Therapeutic Exercise: 8-22 mins                     Vickki Muff, PT, DPT   Acute Rehabilitation Department   Ronnie Derby 05/04/2022, 9:45 AM

## 2022-05-04 NOTE — Discharge Summary (Signed)
Discharge Summary  Date of Admission: 04/23/2022  Date of Discharge: 05/04/22  Attending Physician: Donalee Citrin, MD  Hospital Course: Patient was admitted following an uncomplicated C3-C6 ACDF. They were recovered in PACU and transferred to 4NP. Their neurologic exam improved post-op, he had some dysphagia that was glucocorticoid-responsive, their hospital course was otherwise uncomplicated, PT recommended SNF and he was discharged to SNF on 05/04/22. They will follow up in clinic with Dr. Wynetta Emery in clinic.  Neurologic exam at discharge:  Strength 4+/5 BUE & 5/5 BLE, +minimal hoffman's on R, none on L  Discharge diagnosis: Cervical myelopathy  Allergies as of 05/04/2022       Reactions   Iodine Anaphylaxis   Shellfish Allergy Anaphylaxis   Sodium Hyaluronate (avian) Swelling   r   Compazine [prochlorperazine] Other (See Comments)   Makes hyperactive   Benadryl [diphenhydramine] Anxiety   Venomil Honey Bee Venom [honey Bee Venom] Other (See Comments)   Causes blood poisoning         Medication List     STOP taking these medications    methylPREDNISolone 4 MG Tbpk tablet Commonly known as: MEDROL DOSEPAK       TAKE these medications    acetaminophen 325 MG tablet Commonly known as: TYLENOL Take 650 mg by mouth 2 (two) times daily.   albuterol 108 (90 Base) MCG/ACT inhaler Commonly known as: VENTOLIN HFA Inhale 1-2 puffs into the lungs every 6 (six) hours as needed for wheezing or shortness of breath.   ALPRAZolam 0.5 MG tablet Commonly known as: XANAX Take 0.5 mg by mouth 3 (three) times daily as needed for anxiety.   aspirin 81 MG tablet Take 81 mg by mouth 2 (two) times daily.   cyclobenzaprine 5 MG tablet Commonly known as: FLEXERIL Take 5 mg by mouth 3 (three) times daily as needed for muscle spasms.   famotidine 20 MG tablet Commonly known as: PEPCID Take 20 mg by mouth daily as needed for heartburn.   furosemide 20 MG tablet Commonly known as:  Lasix Take 1 tablet (20 mg total) by mouth daily as needed for edema.   glipiZIDE 10 MG 24 hr tablet Commonly known as: GLUCOTROL XL Take 10 mg by mouth 2 (two) times daily.   guaiFENesin 600 MG 12 hr tablet Commonly known as: MUCINEX Take 600 mg by mouth 2 (two) times daily.   ibuprofen 200 MG tablet Commonly known as: ADVIL Take 200 mg by mouth every 8 (eight) hours as needed for mild pain.   isosorbide mononitrate 30 MG 24 hr tablet Commonly known as: IMDUR Take 30 mg by mouth daily.   losartan 25 MG tablet Commonly known as: COZAAR Take 25 mg by mouth daily.   multivitamin with minerals Tabs tablet Take 1 tablet by mouth daily.   nitroGLYCERIN 0.4 MG SL tablet Commonly known as: NITROSTAT Place 0.4 mg under the tongue every 5 (five) minutes as needed for chest pain.   NON FORMULARY Take 3,000 mg by mouth 2 (two) times daily. [place]   oxyCODONE 5 MG immediate release tablet Commonly known as: Oxy IR/ROXICODONE Take 1 tablet (5 mg total) by mouth every 3 (three) hours as needed (pain).   pioglitazone 45 MG tablet Commonly known as: ACTOS Take 45 mg by mouth daily.   rosuvastatin 10 MG tablet Commonly known as: CRESTOR Take 10 mg by mouth daily at 12 noon.   tamsulosin 0.4 MG Caps capsule Commonly known as: FLOMAX Take 0.4 mg by mouth daily.   Trelegy  Ellipta 100-62.5-25 MCG/ACT Aepb Generic drug: Fluticasone-Umeclidin-Vilant Inhale into the lungs daily.               Discharge Care Instructions  (From admission, onward)           Start     Ordered   05/04/22 0000  Discharge wound care:       Comments: Discharge Instructions  Slowly increase your activity back to normal but no lifting over 10 pounds until discussed at your first follow up appointment.  You can remove your dressing 48 hours after surgery, okay to shower after the dressing is removed. Use regular soap and water and try to be gentle when cleaning your incision.   Follow up  with Dr. Wynetta Emery in 2 weeks after discharge. If you do not already have a discharge appointment, please call his office at [phone] to schedule a follow up appointment. If you have any concerns or questions, please call the office and let us know.   05/04/22 6015             Jadene Pierini, MD 05/04/22 9:15 AM

## 2022-05-04 NOTE — Progress Notes (Signed)
Neurosurgery Service Progress Note  Subjective: No acute events overnight, swallowing improved since I last saw him  Objective: Vitals:   05/03/22 2316 05/04/22 0317 05/04/22 0710 05/04/22 0905  BP: (!) 150/71 (!) 152/80 (!) 146/76   Pulse: 85 62 80   Resp: 16 20 17    Temp: 98.1 F (36.7 C) 98.5 F (36.9 C) 97.6 F (36.4 C)   TempSrc: Oral Oral Axillary   SpO2: 97% 97% 94% 94%  Weight:      Height:        Physical Exam: Strength 4+/5 BUE & 5/5 BLE, +minimal hoffman's on R, none on L, incision c/d/I, neck soft, no stridor, no accessory use or tachypnea  Assessment & Plan: 66 y.o. man s/p ACDF with some post-op acute on chronic dysphagia  -discharge to SNF today  71  05/04/22 9:14 AM

## 2022-05-04 NOTE — TOC Progression Note (Addendum)
Transition of Care [facility]) - Progression Note    Patient Details  Name: Bryan Blair MRN: [id number] Date of Birth: [birth year]  Transition of Care [facility]) CM/SW Contact  Beckie Busing, RN Phone Number:[phone]  05/04/2022, 8:14 AM  Clinical Narrative:    CM received message from Maryagnes Amos at [facility]. Patient has won appeal. [name] has beds available and can accept patient. Message has been sent to nurse and  [name]. CM will need to fax discharge summary and discharge med list to [facility].   [id number] Discharge summary and AVS have been faxed to [facility] at [place]. Transport to be arranged via [facility].    Expected Discharge Plan: Skilled Nursing Facility Barriers to Discharge: Continued Medical Work up, English as a second language teacher  Expected Discharge Plan and Services Expected Discharge Plan: Skilled Nursing Facility     Post Acute Care Choice: Skilled Nursing Facility Living arrangements for the past 2 months: Single Family Home                                       Social Determinants of Health (SDOH) Interventions    Readmission Risk Interventions     No data to display

## 2022-05-04 NOTE — TOC Transition Note (Signed)
Transition of Care [facility]) - CM/SW Discharge Note   Patient Details  Name: Kentavius Dettore MRN: [id number] Date of Birth: 06-17-1956  Transition of Care [facility]) CM/SW Contact:  Beckie Busing, RN Phone [phone]  05/04/2022, 9:56 AM   Clinical Narrative:    Discharge summary and AVS have been faxed to [facility] In patient rehab. Transportation has been set up with [facility]. Please call report to [phone] . Do not have room number yet. D/c packet has been placed in chart. Nurse has been updated. No other TOC needs noted at this time. TOC will sign off.      Barriers to Discharge: Continued Medical Work up, English as a second language teacher   Patient Goals and CMS Choice Patient states their goals for this hospitalization and ongoing recovery are:: to get rehab CMS Medicare.gov Compare Post Acute Care list provided to:: Patient Choice offered to / list presented to : Patient  Discharge Placement                       Discharge Plan and Services     Post Acute Care Choice: Skilled Nursing Facility                               Social Determinants of Health (SDOH) Interventions     Readmission Risk Interventions     No data to display

## 2022-05-05 DIAGNOSIS — H548 Legal blindness, as defined in USA: Secondary | ICD-10-CM | POA: Diagnosis not present

## 2022-05-05 DIAGNOSIS — G959 Disease of spinal cord, unspecified: Secondary | ICD-10-CM | POA: Diagnosis not present

## 2022-05-05 DIAGNOSIS — N400 Enlarged prostate without lower urinary tract symptoms: Secondary | ICD-10-CM | POA: Diagnosis not present

## 2022-05-05 DIAGNOSIS — J449 Chronic obstructive pulmonary disease, unspecified: Secondary | ICD-10-CM | POA: Diagnosis not present

## 2022-05-05 DIAGNOSIS — E1165 Type 2 diabetes mellitus with hyperglycemia: Secondary | ICD-10-CM | POA: Diagnosis not present

## 2022-05-06 DIAGNOSIS — J449 Chronic obstructive pulmonary disease, unspecified: Secondary | ICD-10-CM | POA: Diagnosis not present

## 2022-05-06 DIAGNOSIS — N400 Enlarged prostate without lower urinary tract symptoms: Secondary | ICD-10-CM | POA: Diagnosis not present

## 2022-05-06 DIAGNOSIS — G959 Disease of spinal cord, unspecified: Secondary | ICD-10-CM | POA: Diagnosis not present

## 2022-05-06 DIAGNOSIS — E1165 Type 2 diabetes mellitus with hyperglycemia: Secondary | ICD-10-CM | POA: Diagnosis not present

## 2022-05-06 DIAGNOSIS — H548 Legal blindness, as defined in USA: Secondary | ICD-10-CM | POA: Diagnosis not present

## 2022-05-07 DIAGNOSIS — R1310 Dysphagia, unspecified: Secondary | ICD-10-CM | POA: Diagnosis not present

## 2022-05-07 DIAGNOSIS — N400 Enlarged prostate without lower urinary tract symptoms: Secondary | ICD-10-CM | POA: Diagnosis not present

## 2022-05-07 DIAGNOSIS — I25118 Atherosclerotic heart disease of native coronary artery with other forms of angina pectoris: Secondary | ICD-10-CM | POA: Diagnosis not present

## 2022-05-07 DIAGNOSIS — J449 Chronic obstructive pulmonary disease, unspecified: Secondary | ICD-10-CM | POA: Diagnosis not present

## 2022-05-07 DIAGNOSIS — H548 Legal blindness, as defined in USA: Secondary | ICD-10-CM | POA: Diagnosis not present

## 2022-05-07 DIAGNOSIS — G959 Disease of spinal cord, unspecified: Secondary | ICD-10-CM | POA: Diagnosis not present

## 2022-05-07 DIAGNOSIS — E1165 Type 2 diabetes mellitus with hyperglycemia: Secondary | ICD-10-CM | POA: Diagnosis not present

## 2022-05-07 DIAGNOSIS — E1169 Type 2 diabetes mellitus with other specified complication: Secondary | ICD-10-CM | POA: Diagnosis not present

## 2022-05-08 DIAGNOSIS — M4322 Fusion of spine, cervical region: Secondary | ICD-10-CM | POA: Diagnosis not present

## 2022-05-08 DIAGNOSIS — E1165 Type 2 diabetes mellitus with hyperglycemia: Secondary | ICD-10-CM | POA: Diagnosis not present

## 2022-05-08 DIAGNOSIS — Z7409 Other reduced mobility: Secondary | ICD-10-CM | POA: Diagnosis not present

## 2022-05-08 DIAGNOSIS — M5412 Radiculopathy, cervical region: Secondary | ICD-10-CM | POA: Diagnosis not present

## 2022-05-08 DIAGNOSIS — I10 Essential (primary) hypertension: Secondary | ICD-10-CM | POA: Diagnosis not present

## 2022-05-09 DIAGNOSIS — M5412 Radiculopathy, cervical region: Secondary | ICD-10-CM | POA: Diagnosis not present

## 2022-05-09 DIAGNOSIS — I10 Essential (primary) hypertension: Secondary | ICD-10-CM | POA: Diagnosis not present

## 2022-05-09 DIAGNOSIS — M4322 Fusion of spine, cervical region: Secondary | ICD-10-CM | POA: Diagnosis not present

## 2022-05-09 DIAGNOSIS — R1310 Dysphagia, unspecified: Secondary | ICD-10-CM | POA: Diagnosis not present

## 2022-05-09 DIAGNOSIS — I25118 Atherosclerotic heart disease of native coronary artery with other forms of angina pectoris: Secondary | ICD-10-CM | POA: Diagnosis not present

## 2022-05-09 DIAGNOSIS — Z7409 Other reduced mobility: Secondary | ICD-10-CM | POA: Diagnosis not present

## 2022-05-09 DIAGNOSIS — N400 Enlarged prostate without lower urinary tract symptoms: Secondary | ICD-10-CM | POA: Diagnosis not present

## 2022-05-09 DIAGNOSIS — E1169 Type 2 diabetes mellitus with other specified complication: Secondary | ICD-10-CM | POA: Diagnosis not present

## 2022-05-09 DIAGNOSIS — E1165 Type 2 diabetes mellitus with hyperglycemia: Secondary | ICD-10-CM | POA: Diagnosis not present

## 2022-05-10 DIAGNOSIS — M5412 Radiculopathy, cervical region: Secondary | ICD-10-CM | POA: Diagnosis not present

## 2022-05-10 DIAGNOSIS — M4322 Fusion of spine, cervical region: Secondary | ICD-10-CM | POA: Diagnosis not present

## 2022-05-10 DIAGNOSIS — I10 Essential (primary) hypertension: Secondary | ICD-10-CM | POA: Diagnosis not present

## 2022-05-10 DIAGNOSIS — E1165 Type 2 diabetes mellitus with hyperglycemia: Secondary | ICD-10-CM | POA: Diagnosis not present

## 2022-05-10 DIAGNOSIS — Z7409 Other reduced mobility: Secondary | ICD-10-CM | POA: Diagnosis not present

## 2022-05-11 DIAGNOSIS — N400 Enlarged prostate without lower urinary tract symptoms: Secondary | ICD-10-CM | POA: Diagnosis not present

## 2022-05-11 DIAGNOSIS — E1169 Type 2 diabetes mellitus with other specified complication: Secondary | ICD-10-CM | POA: Diagnosis not present

## 2022-05-11 DIAGNOSIS — I10 Essential (primary) hypertension: Secondary | ICD-10-CM | POA: Diagnosis not present

## 2022-05-11 DIAGNOSIS — E1165 Type 2 diabetes mellitus with hyperglycemia: Secondary | ICD-10-CM | POA: Diagnosis not present

## 2022-05-11 DIAGNOSIS — M5412 Radiculopathy, cervical region: Secondary | ICD-10-CM | POA: Diagnosis not present

## 2022-05-11 DIAGNOSIS — Z7409 Other reduced mobility: Secondary | ICD-10-CM | POA: Diagnosis not present

## 2022-05-11 DIAGNOSIS — M4322 Fusion of spine, cervical region: Secondary | ICD-10-CM | POA: Diagnosis not present

## 2022-05-11 DIAGNOSIS — I25118 Atherosclerotic heart disease of native coronary artery with other forms of angina pectoris: Secondary | ICD-10-CM | POA: Diagnosis not present

## 2022-05-11 DIAGNOSIS — R1310 Dysphagia, unspecified: Secondary | ICD-10-CM | POA: Diagnosis not present

## 2022-05-12 DIAGNOSIS — J449 Chronic obstructive pulmonary disease, unspecified: Secondary | ICD-10-CM | POA: Diagnosis not present

## 2022-05-12 DIAGNOSIS — G959 Disease of spinal cord, unspecified: Secondary | ICD-10-CM | POA: Diagnosis not present

## 2022-05-12 DIAGNOSIS — N400 Enlarged prostate without lower urinary tract symptoms: Secondary | ICD-10-CM | POA: Diagnosis not present

## 2022-05-12 DIAGNOSIS — E1165 Type 2 diabetes mellitus with hyperglycemia: Secondary | ICD-10-CM | POA: Diagnosis not present

## 2022-05-12 DIAGNOSIS — H548 Legal blindness, as defined in USA: Secondary | ICD-10-CM | POA: Diagnosis not present

## 2022-05-13 DIAGNOSIS — M5412 Radiculopathy, cervical region: Secondary | ICD-10-CM | POA: Diagnosis not present

## 2022-05-13 DIAGNOSIS — M4322 Fusion of spine, cervical region: Secondary | ICD-10-CM | POA: Diagnosis not present

## 2022-05-13 DIAGNOSIS — Z7409 Other reduced mobility: Secondary | ICD-10-CM | POA: Diagnosis not present

## 2022-05-13 DIAGNOSIS — E1165 Type 2 diabetes mellitus with hyperglycemia: Secondary | ICD-10-CM | POA: Diagnosis not present

## 2022-05-13 DIAGNOSIS — R1310 Dysphagia, unspecified: Secondary | ICD-10-CM | POA: Diagnosis not present

## 2022-05-13 DIAGNOSIS — E1169 Type 2 diabetes mellitus with other specified complication: Secondary | ICD-10-CM | POA: Diagnosis not present

## 2022-05-13 DIAGNOSIS — N400 Enlarged prostate without lower urinary tract symptoms: Secondary | ICD-10-CM | POA: Diagnosis not present

## 2022-05-13 DIAGNOSIS — I10 Essential (primary) hypertension: Secondary | ICD-10-CM | POA: Diagnosis not present

## 2022-05-13 DIAGNOSIS — I25118 Atherosclerotic heart disease of native coronary artery with other forms of angina pectoris: Secondary | ICD-10-CM | POA: Diagnosis not present

## 2022-05-14 DIAGNOSIS — M4322 Fusion of spine, cervical region: Secondary | ICD-10-CM | POA: Diagnosis not present

## 2022-05-14 DIAGNOSIS — Z7409 Other reduced mobility: Secondary | ICD-10-CM | POA: Diagnosis not present

## 2022-05-14 DIAGNOSIS — M5412 Radiculopathy, cervical region: Secondary | ICD-10-CM | POA: Diagnosis not present

## 2022-05-14 DIAGNOSIS — E1165 Type 2 diabetes mellitus with hyperglycemia: Secondary | ICD-10-CM | POA: Diagnosis not present

## 2022-05-14 DIAGNOSIS — I10 Essential (primary) hypertension: Secondary | ICD-10-CM | POA: Diagnosis not present

## 2022-05-16 DIAGNOSIS — G4730 Sleep apnea, unspecified: Secondary | ICD-10-CM | POA: Diagnosis not present

## 2022-05-16 DIAGNOSIS — Z7984 Long term (current) use of oral hypoglycemic drugs: Secondary | ICD-10-CM | POA: Diagnosis not present

## 2022-05-16 DIAGNOSIS — E785 Hyperlipidemia, unspecified: Secondary | ICD-10-CM | POA: Diagnosis not present

## 2022-05-16 DIAGNOSIS — Z794 Long term (current) use of insulin: Secondary | ICD-10-CM | POA: Diagnosis not present

## 2022-05-16 DIAGNOSIS — Z981 Arthrodesis status: Secondary | ICD-10-CM | POA: Diagnosis not present

## 2022-05-16 DIAGNOSIS — E1140 Type 2 diabetes mellitus with diabetic neuropathy, unspecified: Secondary | ICD-10-CM | POA: Diagnosis not present

## 2022-05-16 DIAGNOSIS — J449 Chronic obstructive pulmonary disease, unspecified: Secondary | ICD-10-CM | POA: Diagnosis not present

## 2022-05-16 DIAGNOSIS — I10 Essential (primary) hypertension: Secondary | ICD-10-CM | POA: Diagnosis not present

## 2022-05-16 DIAGNOSIS — Z4789 Encounter for other orthopedic aftercare: Secondary | ICD-10-CM | POA: Diagnosis not present

## 2022-05-16 DIAGNOSIS — H548 Legal blindness, as defined in USA: Secondary | ICD-10-CM | POA: Diagnosis not present

## 2022-06-08 DIAGNOSIS — H548 Legal blindness, as defined in USA: Secondary | ICD-10-CM | POA: Diagnosis not present

## 2022-06-08 DIAGNOSIS — Z7984 Long term (current) use of oral hypoglycemic drugs: Secondary | ICD-10-CM | POA: Diagnosis not present

## 2022-06-08 DIAGNOSIS — E1140 Type 2 diabetes mellitus with diabetic neuropathy, unspecified: Secondary | ICD-10-CM | POA: Diagnosis not present

## 2022-06-08 DIAGNOSIS — Z4789 Encounter for other orthopedic aftercare: Secondary | ICD-10-CM | POA: Diagnosis not present

## 2022-06-08 DIAGNOSIS — Z794 Long term (current) use of insulin: Secondary | ICD-10-CM | POA: Diagnosis not present

## 2022-06-08 DIAGNOSIS — I10 Essential (primary) hypertension: Secondary | ICD-10-CM | POA: Diagnosis not present

## 2022-06-08 DIAGNOSIS — J449 Chronic obstructive pulmonary disease, unspecified: Secondary | ICD-10-CM | POA: Diagnosis not present

## 2022-06-08 DIAGNOSIS — Z981 Arthrodesis status: Secondary | ICD-10-CM | POA: Diagnosis not present

## 2022-06-08 DIAGNOSIS — G4730 Sleep apnea, unspecified: Secondary | ICD-10-CM | POA: Diagnosis not present

## 2022-06-08 DIAGNOSIS — E785 Hyperlipidemia, unspecified: Secondary | ICD-10-CM | POA: Diagnosis not present

## 2022-06-12 DIAGNOSIS — G959 Disease of spinal cord, unspecified: Secondary | ICD-10-CM | POA: Diagnosis not present

## 2022-06-12 DIAGNOSIS — Z6841 Body Mass Index (BMI) 40.0 and over, adult: Secondary | ICD-10-CM | POA: Diagnosis not present

## 2022-06-13 DIAGNOSIS — J449 Chronic obstructive pulmonary disease, unspecified: Secondary | ICD-10-CM | POA: Diagnosis not present

## 2022-06-13 DIAGNOSIS — Z9889 Other specified postprocedural states: Secondary | ICD-10-CM | POA: Diagnosis not present

## 2022-06-13 DIAGNOSIS — E1165 Type 2 diabetes mellitus with hyperglycemia: Secondary | ICD-10-CM | POA: Diagnosis not present

## 2022-06-13 DIAGNOSIS — R600 Localized edema: Secondary | ICD-10-CM | POA: Diagnosis not present

## 2022-06-13 DIAGNOSIS — M5093 Cervical disc disorder, unspecified, cervicothoracic region: Secondary | ICD-10-CM | POA: Diagnosis not present

## 2022-06-13 DIAGNOSIS — Z720 Tobacco use: Secondary | ICD-10-CM | POA: Diagnosis not present

## 2022-06-13 DIAGNOSIS — I10 Essential (primary) hypertension: Secondary | ICD-10-CM | POA: Diagnosis not present

## 2022-06-13 DIAGNOSIS — L0231 Cutaneous abscess of buttock: Secondary | ICD-10-CM | POA: Diagnosis not present

## 2022-06-15 DIAGNOSIS — I10 Essential (primary) hypertension: Secondary | ICD-10-CM | POA: Diagnosis not present

## 2022-06-15 DIAGNOSIS — Z981 Arthrodesis status: Secondary | ICD-10-CM | POA: Diagnosis not present

## 2022-06-15 DIAGNOSIS — E785 Hyperlipidemia, unspecified: Secondary | ICD-10-CM | POA: Diagnosis not present

## 2022-06-15 DIAGNOSIS — Z4789 Encounter for other orthopedic aftercare: Secondary | ICD-10-CM | POA: Diagnosis not present

## 2022-06-15 DIAGNOSIS — Z794 Long term (current) use of insulin: Secondary | ICD-10-CM | POA: Diagnosis not present

## 2022-06-15 DIAGNOSIS — H548 Legal blindness, as defined in USA: Secondary | ICD-10-CM | POA: Diagnosis not present

## 2022-06-15 DIAGNOSIS — G4730 Sleep apnea, unspecified: Secondary | ICD-10-CM | POA: Diagnosis not present

## 2022-06-15 DIAGNOSIS — E1140 Type 2 diabetes mellitus with diabetic neuropathy, unspecified: Secondary | ICD-10-CM | POA: Diagnosis not present

## 2022-06-15 DIAGNOSIS — J449 Chronic obstructive pulmonary disease, unspecified: Secondary | ICD-10-CM | POA: Diagnosis not present

## 2022-06-15 DIAGNOSIS — Z7984 Long term (current) use of oral hypoglycemic drugs: Secondary | ICD-10-CM | POA: Diagnosis not present

## 2022-07-06 DIAGNOSIS — E785 Hyperlipidemia, unspecified: Secondary | ICD-10-CM | POA: Diagnosis not present

## 2022-07-06 DIAGNOSIS — J449 Chronic obstructive pulmonary disease, unspecified: Secondary | ICD-10-CM | POA: Diagnosis not present

## 2022-07-06 DIAGNOSIS — I10 Essential (primary) hypertension: Secondary | ICD-10-CM | POA: Diagnosis not present

## 2022-07-06 DIAGNOSIS — Z7984 Long term (current) use of oral hypoglycemic drugs: Secondary | ICD-10-CM | POA: Diagnosis not present

## 2022-07-06 DIAGNOSIS — Z981 Arthrodesis status: Secondary | ICD-10-CM | POA: Diagnosis not present

## 2022-07-06 DIAGNOSIS — H548 Legal blindness, as defined in USA: Secondary | ICD-10-CM | POA: Diagnosis not present

## 2022-07-06 DIAGNOSIS — E1140 Type 2 diabetes mellitus with diabetic neuropathy, unspecified: Secondary | ICD-10-CM | POA: Diagnosis not present

## 2022-07-06 DIAGNOSIS — Z794 Long term (current) use of insulin: Secondary | ICD-10-CM | POA: Diagnosis not present

## 2022-07-06 DIAGNOSIS — Z4789 Encounter for other orthopedic aftercare: Secondary | ICD-10-CM | POA: Diagnosis not present

## 2022-07-06 DIAGNOSIS — G4730 Sleep apnea, unspecified: Secondary | ICD-10-CM | POA: Diagnosis not present

## 2022-07-09 DIAGNOSIS — E785 Hyperlipidemia, unspecified: Secondary | ICD-10-CM | POA: Diagnosis not present

## 2022-07-09 DIAGNOSIS — I10 Essential (primary) hypertension: Secondary | ICD-10-CM | POA: Diagnosis not present

## 2022-07-09 DIAGNOSIS — Z794 Long term (current) use of insulin: Secondary | ICD-10-CM | POA: Diagnosis not present

## 2022-07-09 DIAGNOSIS — J449 Chronic obstructive pulmonary disease, unspecified: Secondary | ICD-10-CM | POA: Diagnosis not present

## 2022-07-09 DIAGNOSIS — Z981 Arthrodesis status: Secondary | ICD-10-CM | POA: Diagnosis not present

## 2022-07-09 DIAGNOSIS — Z7984 Long term (current) use of oral hypoglycemic drugs: Secondary | ICD-10-CM | POA: Diagnosis not present

## 2022-07-09 DIAGNOSIS — Z4789 Encounter for other orthopedic aftercare: Secondary | ICD-10-CM | POA: Diagnosis not present

## 2022-07-09 DIAGNOSIS — H548 Legal blindness, as defined in USA: Secondary | ICD-10-CM | POA: Diagnosis not present

## 2022-07-09 DIAGNOSIS — E1140 Type 2 diabetes mellitus with diabetic neuropathy, unspecified: Secondary | ICD-10-CM | POA: Diagnosis not present

## 2022-07-09 DIAGNOSIS — G4730 Sleep apnea, unspecified: Secondary | ICD-10-CM | POA: Diagnosis not present

## 2022-08-13 ENCOUNTER — Ambulatory Visit (HOSPITAL_COMMUNITY): Payer: No Typology Code available for payment source | Admitting: Occupational Therapy

## 2022-08-14 ENCOUNTER — Ambulatory Visit (HOSPITAL_COMMUNITY): Payer: No Typology Code available for payment source | Attending: Physician Assistant | Admitting: Occupational Therapy

## 2022-08-14 DIAGNOSIS — R29898 Other symptoms and signs involving the musculoskeletal system: Secondary | ICD-10-CM

## 2022-08-14 DIAGNOSIS — R278 Other lack of coordination: Secondary | ICD-10-CM | POA: Diagnosis not present

## 2022-08-14 NOTE — Therapy (Unsigned)
OUTPATIENT OCCUPATIONAL THERAPY ORTHO EVALUATION  Patient Name: Bryan Blair MRN: [id number] DOB:[birth year], 66 y.o., male Today's Date: 08/14/2022  PCP: Wanita Chamberlain, PA-C REFERRING PROVIDER: Wanita Chamberlain PA-C    Past Medical History:  Diagnosis Date   Anxiety    Arthritis    BPH (benign prostatic hyperplasia)    Complication of anesthesia    difficult to get patient to sleep   COPD (chronic obstructive pulmonary disease) ([place])    Coronary artery disease    Angioplasty LAD 1995, angioplasty circumflex 1996, stent intervention circumflex 2000   Dysrhythmia    PVC's   Essential hypertension    H/O unstable angina    Headache    Hyperlipidemia    Legally blind    Neuromuscular disorder ([place])    neuropathy   Neuropathy    Pneumonia    PVC (premature ventricular contraction)    Sleep apnea    no cpap   STEMI (ST elevation myocardial infarction) ([place]) 2000   Type 2 diabetes mellitus ([place])    Past Surgical History:  Procedure Laterality Date   ANTERIOR CERVICAL DECOMP/DISCECTOMY FUSION N/A 04/23/2022   Procedure: Anterior Cervical Decompression Fusion - Cervical three-Cervical four - Cervical four-Cervical five - Cervical five-Cervical six;  Surgeon: Kary Kos, MD;  Location: [place];  Service: Neurosurgery;  Laterality: N/A;   BACK SURGERY     CARPAL TUNNEL RELEASE Left    CATARACT EXTRACTION W/PHACO Right 03/19/2022   Procedure: CATARACT EXTRACTION PHACO AND INTRAOCULAR LENS PLACEMENT ([place]);  Surgeon: Baruch Goldmann, MD;  Location[facility];  Service: Ophthalmology;  Laterality: Right;  CDE: 35.65   HAND SURGERY     HERNIA REPAIR     JOINT REPLACEMENT     TONSILLECTOMY     ULNAR NERVE REPAIR     Patient Active Problem List   Diagnosis Date Noted   Myelopathy concurrent with and due to spinal stenosis of cervical region ([place]) 04/23/2022   Status post surgery 04/23/2022   Preoperative clearance 03/26/2022   Asthmatic bronchitis , chronic 11/07/2021   Morbid  obesity due to excess calories ([place]) 11/07/2021   Cigarette smoker 11/07/2021    ONSET DATE: 04/23/22 (spinal surgery)  REFERRING DIAG: bilateral UE weakness  THERAPY DIAG:  No diagnosis found.  Rationale for Evaluation and Treatment: Rehabilitation  SUBJECTIVE:   SUBJECTIVE STATEMENT: I feel like my arms are 100 pounds. Pt accompanied by: self  PERTINENT HISTORY: ***  PRECAUTIONS: None  WEIGHT BEARING RESTRICTIONS: Yes 20 lbs  PAIN:  Are you having pain? No  FALLS: Has patient fallen in last 6 months? No  LIVING ENVIRONMENT: Lives with: lives alone Lives in: House/apartment  PLOF: Independent and Independent with household mobility without device  PATIENT GOALS: Improve strength in BUE   OBJECTIVE:   HAND DOMINANCE: Right  ADLs: Overall ADLs: Overall able to complete basic ADL's, difficulty with buttons and zippers. Difficulty with all IADL's due to weakness.   FUNCTIONAL OUTCOME MEASURES: Quick Dash: 40.91  UPPER EXTREMITY ROM:     Active ROM Right eval Left eval  Shoulder flexion 122 146  Shoulder abduction 140 150  Shoulder internal rotation 90 90  Shoulder external rotation 70 76  (Blank rows = not tested)   UPPER EXTREMITY MMT:     MMT Right eval Left eval  Shoulder flexion 4+/5 4+/5  Shoulder abduction 4+/5 4+/5  Shoulder adduction 4+/5 4+/5  Shoulder extension 4+/5 4+/5  Shoulder internal rotation 4+/5 4+/5  Shoulder external rotation 4+/5 4+/5  Elbow  flexion 4+/5 4+/5  Elbow extension 4+/5 4+/5  Wrist flexion    Wrist extension    Wrist ulnar deviation    Wrist radial deviation    Wrist pronation    Wrist supination    (Blank rows = not tested)  HAND FUNCTION: Grip strength: Right: *** lbs; Left: *** lbs  SENSATION: Light touch: Impaired   EDEMA: No swelling  OBSERVATIONS: ***   TODAY'S TREATMENT:                                                                                                                               DATE: ***    PATIENT EDUCATION: Education details: *** Person educated: {Person educated:25204} Education method: {Education Method:25205} Education comprehension: {Education Comprehension:25206}  HOME EXERCISE PROGRAM: ***  GOALS: Goals reviewed with patient? {yes/no:20286}  SHORT TERM GOALS: Target date: {follow up:25551}    *** Baseline: Goal status: {GOALSTATUS:25110}  2.  *** Baseline:  Goal status: {GOALSTATUS:25110}  3.  *** Baseline:  Goal status: {GOALSTATUS:25110}  4.  *** Baseline:  Goal status: {GOALSTATUS:25110}  5.  *** Baseline:  Goal status: {GOALSTATUS:25110}  6.  *** Baseline:  Goal status: {GOALSTATUS:25110}  LONG TERM GOALS: Target date: {follow up:25551}    *** Baseline:  Goal status: {GOALSTATUS:25110}  2.  *** Baseline:  Goal status: {GOALSTATUS:25110}  3.  *** Baseline:  Goal status: {GOALSTATUS:25110}  4.  *** Baseline:  Goal status: {GOALSTATUS:25110}  5.  *** Baseline:  Goal status: {GOALSTATUS:25110}  6.  *** Baseline:  Goal status: {GOALSTATUS:25110}  ASSESSMENT:  CLINICAL IMPRESSION: Patient is a *** y.o. *** who was seen today for occupational therapy evaluation for ***.   PERFORMANCE DEFICITS: in functional skills including {OT physical skills:25468}, cognitive skills including {OT cognitive skills:25469}, and psychosocial skills including {OT psychosocial skills:25470}.   IMPAIRMENTS: are limiting patient from {OT performance deficits:25471}.   COMORBIDITIES: {Comorbidities:25485} that affects occupational performance. Patient will benefit from skilled OT to address above impairments and improve overall function.  MODIFICATION OR ASSISTANCE TO COMPLETE EVALUATION: {OT modification:25474}  OT OCCUPATIONAL PROFILE AND HISTORY: {OT PROFILE AND HISTORY:25484}  CLINICAL DECISION MAKING: {OT CDM:25475}  REHAB POTENTIAL: {rehabpotential:25112}  EVALUATION COMPLEXITY: {Evaluation  complexity:25115}      PLAN:  OT FREQUENCY: {rehab frequency:25116}  OT DURATION: {rehab duration:25117}  PLANNED INTERVENTIONS: {OT Interventions:25467}  RECOMMENDED OTHER SERVICES: ***  CONSULTED AND AGREED WITH PLAN OF CARE: [id number]  PLAN FOR NEXT SESSION: ***   Denarius Sesler Isabelle Course, OT 08/14/2022, 9:59 AM

## 2022-08-14 NOTE — Patient Instructions (Signed)
Perform each exercise ____10-15____ reps. 2-3x days.   1) Protraction   Start by holding a wand or cane at chest height.  Next, slowly push the wand outwards in front of your body so that your elbows become fully straightened. Then, return to the original position.     2) Shoulder FLEXION   In the standing position, hold a wand/cane with both arms, palms down on both sides. Raise up the wand/cane allowing your unaffected arm to perform most of the effort. Your affected arm should be partially relaxed.      3) Internal/External ROTATION   In the standing position, hold a wand/cane with both hands keeping your elbows bent. Move your arms and wand/cane to one side.  Your affected arm should be partially relaxed while your unaffected arm performs most of the effort.       4) Shoulder ABDUCTION   While holding a wand/cane palm face up on the injured side and palm face down on the uninjured side, slowly raise up your injured arm to the side.        5) Horizontal Abduction/Adduction      Straight arms holding cane at shoulder height, bring cane to right, center, left. Repeat starting to left.   Copyright  VHI. All rights reserved.    1) (Home) Extension: Isometric / Bilateral Arm Retraction - Sitting   Facing anchor, hold hands and elbow at shoulder height, with elbow bent.  Pull arms back to squeeze shoulder blades together. Repeat 10-15 times. 1-3 times/day.   2) (Clinic) Extension / Flexion (Assist)   Face anchor, pull arms back, keeping elbow straight, and squeze shoulder blades together. Repeat 10-15 times. 1-3 times/day.   Copyright  VHI. All rights reserved.   3) (Home) Retraction: Row - Bilateral (Anchor)   Facing anchor, arms reaching forward, pull hands toward stomach, keeping elbows bent and at your sides and pinching shoulder blades together. Repeat 10-15 times. 1-3 times/day.   Copyright  VHI. All rights reserved.    1) Strengthening: Chest  Pull - Resisted   Hold Theraband in front of body with hands about shoulder width a part. Pull band a part and back together slowly. Repeat ____ times. Complete ____ set(s) per session.. Repeat ____ session(s) per day.  http://orth.exer.us/926   Copyright  VHI. All rights reserved.   2) PNF Strengthening: Resisted   Standing with resistive band around each hand, bring right arm up and away, thumb back. Repeat ____ times per set. Do ____ sets per session. Do ____ sessions per day.                           3) Resisted External Rotation: in Neutral - Bilateral   Sit or stand, tubing in both hands, elbows at sides, bent to 90, forearms forward. Pinch shoulder blades together and rotate forearms out. Keep elbows at sides. Repeat ____ times per set. Do ____ sets per session. Do ____ sessions per day.  http://orth.exer.us/966   Copyright  VHI. All rights reserved.   4) PNF Strengthening: Resisted   Standing, hold resistive band above head. Bring right arm down and out from side. Repeat ____ times per set. Do ____ sets per session. Do ____ sessions per day.  http://orth.exer.us/922   Copyright  VHI. All rights reserved.    Theraband strengthening: Complete 10-15X, 1-2X/day  1) Shoulder protraction  Anchor band in doorway, stand with back to door. Push your hand forward as much  as you can to bringing your shoulder blades forward on your rib cage.      2) Shoulder horizontal abduction  Standing with a theraband anchored at chest height, begin with arm straight and some tension in the band. Move your arm out to your side (keeping straight the whole time). Bring the affected arm back to midline.     3) Shoulder Internal Rotation  While holding an elastic band at your side with your elbow bent, start with your hand away from your stomach, then pull the band towards your stomach. Keep your elbow near your side the entire time.     4)  Shoulder External Rotation  While holding an elastic band at your side with your elbow bent, start with your hand near your stomach and then pull the band away. Keep your elbow at your side the entire time.     5) Shoulder flexion  While standing with back to the door, holding Theraband at hand level, raise arm in front of you.  Keep elbow straight through entire movement.      6) Shoulder abduction  While holding an elastic band at your side, draw up your arm to the side keeping your elbow straight.

## 2022-08-15 ENCOUNTER — Encounter (HOSPITAL_COMMUNITY): Payer: Self-pay | Admitting: Occupational Therapy

## 2022-08-16 ENCOUNTER — Ambulatory Visit: Payer: No Typology Code available for payment source | Admitting: Cardiology

## 2022-08-16 DIAGNOSIS — H2512 Age-related nuclear cataract, left eye: Secondary | ICD-10-CM | POA: Diagnosis not present

## 2022-08-16 NOTE — Progress Notes (Deleted)
Cardiology Office Note  Date: 08/16/2022   ID: Bryan Blair, DOB 07-11-1956, MRN [id number]  PCP:  Dion Saucier, PA-C  Cardiologist:  Nona Dell, MD Electrophysiologist:  None   No chief complaint on file.   History of Present Illness: Bryan Blair is a 66 y.o. male last seen in June by Ms. Duke PA-C, I reviewed the note.  Past Medical History:  Diagnosis Date   Anxiety    Arthritis    BPH (benign prostatic hyperplasia)    Complication of anesthesia    difficult to get patient to sleep   COPD (chronic obstructive pulmonary disease) ([facility])    Coronary artery disease    Angioplasty LAD 1995, angioplasty circumflex 1996, stent intervention circumflex 2000   Dysrhythmia    PVC's   Essential hypertension    H/O unstable angina    Headache    Hyperlipidemia    Legally blind    Neuromuscular disorder ([facility])    neuropathy   Neuropathy    Pneumonia    PVC (premature ventricular contraction)    Sleep apnea    no cpap   STEMI (ST elevation myocardial infarction) ([facility]) 2000   Type 2 diabetes mellitus ([facility])     Past Surgical History:  Procedure Laterality Date   ANTERIOR CERVICAL DECOMP/DISCECTOMY FUSION N/A 04/23/2022   Procedure: Anterior Cervical Decompression Fusion - Cervical three-Cervical four - Cervical four-Cervical five - Cervical five-Cervical six;  Surgeon: Donalee Citrin, MD;  Location: [facility] OR;  Service: Neurosurgery;  Laterality: N/A;   BACK SURGERY     CARPAL TUNNEL RELEASE Left    CATARACT EXTRACTION W/PHACO Right 03/19/2022   Procedure: CATARACT EXTRACTION PHACO AND INTRAOCULAR LENS PLACEMENT ([facility]);  Surgeon: [name], MD;  Location: [facility];  Service: Ophthalmology;  Laterality: Right;  CDE: 35.65   HAND SURGERY     HERNIA REPAIR     JOINT REPLACEMENT     TONSILLECTOMY     ULNAR NERVE REPAIR      Current Outpatient Medications  Medication Sig Dispense Refill   acetaminophen (TYLENOL) 325 MG tablet Take 650 mg by mouth 2 (two) times  daily.     albuterol (VENTOLIN HFA) 108 (90 Base) MCG/ACT inhaler Inhale 1-2 puffs into the lungs every 6 (six) hours as needed for wheezing or shortness of breath.     ALPRAZolam (XANAX) 0.5 MG tablet Take 0.5 mg by mouth 3 (three) times daily as needed for anxiety.     aspirin 81 MG tablet Take 81 mg by mouth 2 (two) times daily. (Patient not taking: Reported on 04/24/2022)     cyclobenzaprine (FLEXERIL) 5 MG tablet Take 5 mg by mouth 3 (three) times daily as needed for muscle spasms. (Patient not taking: Reported on 04/24/2022)     famotidine (PEPCID) 20 MG tablet Take 20 mg by mouth daily as needed for heartburn.     Fluticasone-Umeclidin-Vilant (TRELEGY ELLIPTA) 100-62.5-25 MCG/INH AEPB Inhale into the lungs daily.     furosemide (LASIX) 20 MG tablet Take 1 tablet (20 mg total) by mouth daily as needed for edema. 30 tablet 2   glipiZIDE (GLUCOTROL XL) 10 MG 24 hr tablet Take 10 mg by mouth 2 (two) times daily.     guaiFENesin (MUCINEX) 600 MG 12 hr tablet Take 600 mg by mouth 2 (two) times daily.     ibuprofen (ADVIL) 200 MG tablet Take 200 mg by mouth every 8 (eight) hours as needed for mild pain.     isosorbide mononitrate (IMDUR) 30  MG 24 hr tablet Take 30 mg by mouth daily.     losartan (COZAAR) 25 MG tablet Take 25 mg by mouth daily.     Multiple Vitamin (MULTIVITAMIN WITH MINERALS) TABS tablet Take 1 tablet by mouth daily.     nitroGLYCERIN (NITROSTAT) 0.4 MG SL tablet Place 0.4 mg under the tongue every 5 (five) minutes as needed for chest pain.     NON FORMULARY Take 3,000 mg by mouth 2 (two) times daily. [place]     oxyCODONE (OXY IR/ROXICODONE) 5 MG immediate release tablet Take 1 tablet (5 mg total) by mouth every 3 (three) hours as needed (pain). 30 tablet 0   pioglitazone (ACTOS) 45 MG tablet Take 45 mg by mouth daily.     rosuvastatin (CRESTOR) 10 MG tablet Take 10 mg by mouth daily at 12 noon.     tamsulosin (FLOMAX) 0.4 MG CAPS capsule Take 0.4 mg by mouth daily.     No  current facility-administered medications for this visit.   Allergies:  Iodine, Shellfish allergy, Sodium hyaluronate (avian), Compazine [prochlorperazine], Benadryl [diphenhydramine], and Venomil honey bee venom [honey bee venom]   Social History: The patient  reports that he has been smoking cigarettes. He has quit using smokeless tobacco. He reports that he does not drink alcohol and does not use drugs.   Family History: The patient's family history includes Heart disease in his father and mother; Stroke in his father.   ROS:  Please see the history of present illness. Otherwise, complete review of systems is positive for {NONE DEFAULTED:18576}.  All other systems are reviewed and negative.   Physical Exam: VS:  There were no vitals taken for this visit., BMI There is no height or weight on file to calculate BMI.  Wt Readings from Last 3 Encounters:  04/23/22 (!) 315 lb (142.9 kg)  03/19/22 (!) 320 lb 15.8 oz (145.6 kg)  03/02/22 (!) 320 lb 15.8 oz (145.6 kg)    General: Patient appears comfortable at rest. HEENT: Conjunctiva and lids normal, oropharynx clear with moist mucosa. Neck: Supple, no elevated JVP or carotid bruits, no thyromegaly. Lungs: Clear to auscultation, nonlabored breathing at rest. Cardiac: Regular rate and rhythm, no S3 or significant systolic murmur, no pericardial rub. Abdomen: Soft, nontender, no hepatomegaly, bowel sounds present, no guarding or rebound. Extremities: No pitting edema, distal pulses 2+. Skin: Warm and dry. Musculoskeletal: No kyphosis. Neuropsychiatric: Alert and oriented x3, affect grossly appropriate.  ECG:  An ECG dated 03/02/2022 was personally reviewed today and demonstrated:  Sinus rhythm with low voltage.  Recent Labwork: 09/13/2021: ALT 16; AST 16; B Natriuretic Peptide 85.0 04/20/2022: Hemoglobin 14.6; Platelets 222 04/29/2022: BUN 16; Creatinine, Ser 0.94; Potassium 3.7; Sodium 138  August 2023: Hemoglobin 12.8, platelets 235, BUN  22, creatinine 0.94, potassium 3.7  Other Studies Reviewed Today:  He was previously followed by the [facility] (Dr. Darrold Junker) and reportedly follow-up cardiac catheterization in April 2016 revealed patent left circumflex stent site and otherwise nonobstructive disease.  He was then seen by [facility] in 2019.  Follow-up cardiac catheterization at that time reported minor coronary artery disease with negative IFR of the mid circumflex (1.0).   Assessment and Plan:   Medication Adjustments/Labs and Tests Ordered: Current medicines are reviewed at length with the patient today.  Concerns regarding medicines are outlined above.   Tests Ordered: No orders of the defined types were placed in this encounter.   Medication Changes: No orders of the defined types were placed in  this encounter.   Disposition:  Follow up {follow up:15908}  Signed, Jonelle Sidle, MD, [facility] 08/16/2022 11:24 AM    [facility] at [facility] [address] [place], [place] [address] Phone: [phone]; Fax: [phone]

## 2022-08-20 ENCOUNTER — Encounter (HOSPITAL_COMMUNITY): Payer: Self-pay

## 2022-08-20 ENCOUNTER — Other Ambulatory Visit: Payer: Self-pay

## 2022-08-20 NOTE — Pre-Procedure Instructions (Signed)
Received message to call patient concerning Wednesday,10/22/2021 preop. Attempted to call him number he left without success.

## 2022-08-21 NOTE — H&P (Signed)
Surgical History & Physical  Patient Name: Bryan Blair DOB: 01-08-1956  Surgery: Cataract extraction with intraocular lens implant phacoemulsification; Left Eye  Surgeon: [name] MD Surgery Date:  08-24-22 Pre-Op Date:  08-16-22  HPI: A 68 Yr. old male patient present for cataract evaluation. Patient had neck surgery after having cataract sx OD. He is back now to have his preop for OS. 1. The patient complains of difficulty when recognizing people, which has worsen since sx OD. The left eye is affected. The complaint is associated with blurry vision. Patient denies using eye drops. HPI was performed by [name] .  Medical History: Cataracts Diabetic Retinopathy Diabetes Heart Problem High Blood Pressure LDL Lung Problems  Review of Systems Negative Allergic/Immunologic Negative Cardiovascular Negative Constitutional Negative Ear, Nose, Mouth & Throat Negative Endocrine Negative Eyes Negative Gastrointestinal Negative Genitourinary Negative Hemotologic/Lymphatic Negative Integumentary Negative Musculoskeletal Negative Neurological Negative Psychiatry Negative Respiratory  Social   Current some day smoker /  Cigarettes   Medication  Tamsulosin, Glipizide, Actos, Norvasc, Rosuvastatin, Amlodipine, Xanax,   Sx/Procedures Phaco c IOL OD,  Cervical fusion, Tonsilectomy, Adenoids Removed, Hernia surgery, Bilateral Knee Replacement, Heart Stents, Neck surgery,   Drug Allergies  Iodine, Compazine, Supartz, BENADRYL, Bees,   History & Physical: Heent: Cataract, left eye NECK: supple without bruits LUNGS: lungs clear to auscultation CV: regular rate and rhythm Abdomen: soft and non-tender Impression & Plan: Assessment: 1.  INTRAOCULAR LENS IOL ; Right Eye (Z96.1) 2.  NUCLEAR SCLEROSIS AGE RELATED; Left Eye (H25.12)  Plan: 1.  Doing well since surgery  2.  Cataract accounts for the patient's decreased vision. This visual impairment is not  correctable with a tolerable change in glasses or contact lenses. Cataract surgery with an implantation of a new lens should significantly improve the visual and functional status of the patient. Discussed all risks, benefits, alternatives, and potential complications. Discussed the procedures and recovery. Patient desires to have surgery. A-scan ordered and performed today for intra-ocular lens calculations. The surgery will be performed in order to improve vision for driving, reading, and for eye examinations. Recommend phacoemulsification with intra-ocular lens. Recommend Dextenza for post-operative pain and inflammation. Left Eye. Surgery required to correct imbalance of vision. Dilates poorly - shugarcaine by protocol. Malyugin Ring. Omidira. General anesthesia - significant nystagmus and long eye (retrobulbar block would be dangerous)

## 2022-08-22 ENCOUNTER — Encounter (HOSPITAL_COMMUNITY)
Admission: RE | Admit: 2022-08-22 | Discharge: 2022-08-22 | Disposition: A | Discharge status: Home / Self Care | Payer: No Typology Code available for payment source | Source: Ambulatory Visit | Attending: Ophthalmology | Admitting: Ophthalmology

## 2022-08-24 ENCOUNTER — Ambulatory Visit (HOSPITAL_COMMUNITY)
Admission: RE | Admit: 2022-08-24 | Discharge: 2022-08-24 | Disposition: A | Discharge status: Home / Self Care | Payer: No Typology Code available for payment source | Attending: Ophthalmology | Admitting: Ophthalmology

## 2022-08-24 ENCOUNTER — Ambulatory Visit (HOSPITAL_BASED_OUTPATIENT_CLINIC_OR_DEPARTMENT_OTHER): Payer: No Typology Code available for payment source | Admitting: Anesthesiology

## 2022-08-24 ENCOUNTER — Encounter (HOSPITAL_COMMUNITY): Payer: Self-pay | Admitting: Ophthalmology

## 2022-08-24 ENCOUNTER — Encounter (HOSPITAL_COMMUNITY)
Admission: RE | Disposition: A | Discharge status: Home / Self Care | Payer: Self-pay | Source: Home / Self Care | Attending: Ophthalmology

## 2022-08-24 ENCOUNTER — Ambulatory Visit (HOSPITAL_COMMUNITY): Payer: No Typology Code available for payment source | Admitting: Anesthesiology

## 2022-08-24 DIAGNOSIS — F17210 Nicotine dependence, cigarettes, uncomplicated: Secondary | ICD-10-CM | POA: Insufficient documentation

## 2022-08-24 DIAGNOSIS — J449 Chronic obstructive pulmonary disease, unspecified: Secondary | ICD-10-CM

## 2022-08-24 DIAGNOSIS — Z961 Presence of intraocular lens: Secondary | ICD-10-CM | POA: Insufficient documentation

## 2022-08-24 DIAGNOSIS — H2181 Floppy iris syndrome: Secondary | ICD-10-CM | POA: Diagnosis not present

## 2022-08-24 DIAGNOSIS — H2512 Age-related nuclear cataract, left eye: Secondary | ICD-10-CM | POA: Insufficient documentation

## 2022-08-24 DIAGNOSIS — I2510 Atherosclerotic heart disease of native coronary artery without angina pectoris: Secondary | ICD-10-CM | POA: Diagnosis not present

## 2022-08-24 DIAGNOSIS — I10 Essential (primary) hypertension: Secondary | ICD-10-CM | POA: Insufficient documentation

## 2022-08-24 DIAGNOSIS — E1136 Type 2 diabetes mellitus with diabetic cataract: Secondary | ICD-10-CM | POA: Diagnosis not present

## 2022-08-24 HISTORY — PX: CATARACT EXTRACTION W/PHACO: SHX586

## 2022-08-24 LAB — GLUCOSE, CAPILLARY: Glucose-Capillary: 158 mg/dL — ABNORMAL HIGH (ref 70–99)

## 2022-08-24 SURGERY — PHACOEMULSIFICATION, CATARACT, WITH IOL INSERTION
Anesthesia: General | Site: Eye | Laterality: Left

## 2022-08-24 MED ORDER — PROPOFOL 10 MG/ML IV BOLUS
INTRAVENOUS | Status: DC | PRN
  Administered 2022-08-24: 150 mg via INTRAVENOUS

## 2022-08-24 MED ORDER — BSS IO SOLN
INTRAOCULAR | Status: DC | PRN
  Administered 2022-08-24: 15 mL via INTRAOCULAR

## 2022-08-24 MED ORDER — ONDANSETRON HCL 4 MG/2ML IJ SOLN
INTRAMUSCULAR | Status: DC | PRN
  Administered 2022-08-24: 4 mg via INTRAVENOUS

## 2022-08-24 MED ORDER — PHENYLEPHRINE-KETOROLAC 1-0.3 % IO SOLN
INTRAOCULAR | Status: AC
  Filled 2022-08-24: qty 4

## 2022-08-24 MED ORDER — MIDAZOLAM HCL 2 MG/2ML IJ SOLN
INTRAMUSCULAR | Status: AC
  Filled 2022-08-24: qty 2

## 2022-08-24 MED ORDER — POVIDONE-IODINE 5 % OP SOLN
OPHTHALMIC | Status: DC | PRN
  Administered 2022-08-24: 1 via OPHTHALMIC

## 2022-08-24 MED ORDER — SUGAMMADEX SODIUM 500 MG/5ML IV SOLN
INTRAVENOUS | Status: DC | PRN
  Administered 2022-08-24: 500 mg via INTRAVENOUS

## 2022-08-24 MED ORDER — MOXIFLOXACIN HCL 5 MG/ML IO SOLN
INTRAOCULAR | Status: AC
  Filled 2022-08-24: qty 1

## 2022-08-24 MED ORDER — SODIUM HYALURONATE 10 MG/ML IO SOLUTION
PREFILLED_SYRINGE | INTRAOCULAR | Status: DC | PRN
  Administered 2022-08-24: .85 mL via INTRAOCULAR

## 2022-08-24 MED ORDER — PHENYLEPHRINE HCL 2.5 % OP SOLN
1.0000 [drp] | OPHTHALMIC | Status: AC | PRN
  Administered 2022-08-24 (×3): 1 [drp] via OPHTHALMIC

## 2022-08-24 MED ORDER — PHENYLEPHRINE-KETOROLAC 1-0.3 % IO SOLN
INTRAOCULAR | Status: DC | PRN
  Administered 2022-08-24: 500 mL via OPHTHALMIC

## 2022-08-24 MED ORDER — LIDOCAINE HCL (CARDIAC) PF 100 MG/5ML IV SOSY
PREFILLED_SYRINGE | INTRAVENOUS | Status: DC | PRN
  Administered 2022-08-24: 100 mg via INTRAVENOUS

## 2022-08-24 MED ORDER — EPINEPHRINE PF 1 MG/ML IJ SOLN
INTRAMUSCULAR | Status: AC
  Filled 2022-08-24: qty 1

## 2022-08-24 MED ORDER — TROPICAMIDE 1 % OP SOLN
1.0000 [drp] | OPHTHALMIC | Status: AC | PRN
  Administered 2022-08-24 (×3): 1 [drp] via OPHTHALMIC

## 2022-08-24 MED ORDER — LIDOCAINE HCL (PF) 1 % IJ SOLN
INTRAOCULAR | Status: DC | PRN
  Administered 2022-08-24: 1 mL via OPHTHALMIC

## 2022-08-24 MED ORDER — LIDOCAINE HCL 3.5 % OP GEL
1.0000 | Freq: Once | OPHTHALMIC | Status: AC
  Administered 2022-08-24: 1 via OPHTHALMIC

## 2022-08-24 MED ORDER — TETRACAINE HCL 0.5 % OP SOLN
1.0000 [drp] | OPHTHALMIC | Status: AC | PRN
  Administered 2022-08-24 (×3): 1 [drp] via OPHTHALMIC

## 2022-08-24 MED ORDER — DEXAMETHASONE SODIUM PHOSPHATE 10 MG/ML IJ SOLN
INTRAMUSCULAR | Status: DC | PRN
  Administered 2022-08-24: 10 mg via INTRAVENOUS

## 2022-08-24 MED ORDER — MIDAZOLAM HCL 5 MG/5ML IJ SOLN
INTRAMUSCULAR | Status: DC | PRN
  Administered 2022-08-24: 2 mg via INTRAVENOUS

## 2022-08-24 MED ORDER — STERILE WATER FOR IRRIGATION IR SOLN
Status: DC | PRN
  Administered 2022-08-24: 25 mL

## 2022-08-24 MED ORDER — LACTATED RINGERS IV SOLN: INTRAVENOUS | Status: DC | PRN

## 2022-08-24 MED ORDER — FENTANYL CITRATE (PF) 100 MCG/2ML IJ SOLN
INTRAMUSCULAR | Status: AC
  Filled 2022-08-24: qty 2

## 2022-08-24 MED ORDER — SODIUM HYALURONATE 23MG/ML IO SOSY
PREFILLED_SYRINGE | INTRAOCULAR | Status: DC | PRN
  Administered 2022-08-24: .6 mL via INTRAOCULAR

## 2022-08-24 MED ORDER — FENTANYL CITRATE (PF) 100 MCG/2ML IJ SOLN
INTRAMUSCULAR | Status: DC | PRN
  Administered 2022-08-24: 100 ug via INTRAVENOUS

## 2022-08-24 MED ORDER — MOXIFLOXACIN HCL 0.5 % OP SOLN
OPHTHALMIC | Status: DC | PRN
  Administered 2022-08-24: .3 mL via OPHTHALMIC

## 2022-08-24 MED ORDER — ROCURONIUM BROMIDE 100 MG/10ML IV SOLN
INTRAVENOUS | Status: DC | PRN
  Administered 2022-08-24: 50 mg via INTRAVENOUS

## 2022-08-24 SURGICAL SUPPLY — 16 items
CATARACT SUITE SIGHTPATH (MISCELLANEOUS) ×1 IMPLANT
CLOTH BEACON ORANGE TIMEOUT ST (SAFETY) ×1 IMPLANT
EYE SHIELD UNIVERSAL CLEAR (GAUZE/BANDAGES/DRESSINGS) IMPLANT
FEE CATARACT SUITE SIGHTPATH (MISCELLANEOUS) ×1 IMPLANT
GLOVE BIOGEL PI IND STRL 7.0 (GLOVE) ×2 IMPLANT
LENS IOL ACRSF EXPAND 0.0 (Intraocular Lens) IMPLANT
LENS IOL ACRYSOF EXPAND 0.0 (Intraocular Lens) ×1 IMPLANT
NDL HYPO 18GX1.5 BLUNT FILL (NEEDLE) ×1 IMPLANT
NEEDLE HYPO 18GX1.5 BLUNT FILL (NEEDLE) ×1 IMPLANT
PAD ARMBOARD 7.5X6 YLW CONV (MISCELLANEOUS) ×1 IMPLANT
PROC W SPEC LENS (INTRAOCULAR LENS) ×1
PROCESS W SPEC LENS (INTRAOCULAR LENS) IMPLANT
RING MALYGIN 7.0 (MISCELLANEOUS) IMPLANT
SYR TB 1ML LL NO SAFETY (SYRINGE) ×1 IMPLANT
TAPE SURG TRANSPORE 1 IN (GAUZE/BANDAGES/DRESSINGS) IMPLANT
TAPE SURGICAL TRANSPORE 1 IN (GAUZE/BANDAGES/DRESSINGS) ×1

## 2022-08-24 NOTE — Op Note (Signed)
Date of procedure: 08/24/22  Pre-operative diagnosis: Visually significant age-related nuclear cataract, Left Eye; Poor dilation, Left eye (H25.12)   Post-operative diagnosis: Visually significant age-related cataract, Left Eye; Intra-operative Floppy Iris Syndrome, Left Eye (H21.81)  Procedure: Complex removal of cataract via phacoemulsification and insertion of intra-ocular lens Alcon MA60MA +0.0D into the capsular bag of the Left Eye (CPT [phone])  Attending surgeon: Rudy Jew. Chelsae Zanella, MD, MA  Anesthesia: General, Topical Akten  Complications: None  Estimated Blood Loss: <52mL (minimal)  Specimens: None  Implants: As above  Indications:  Visually significant cataract, Left Eye  Procedure:  The patient was seen and identified in the pre-operative area. The operative eye was identified and dilated.  The operative eye was marked.  Topical anesthesia was administered to the operative eye.     The patient was then to the operative suite and placed in the supine position. General anesthesia was administered. A timeout was performed confirming the patient, procedure to be performed, and all other relevant information.   The patient's face was prepped and draped in the usual fashion for intra-ocular surgery.  A lid speculum was placed into the operative eye and the surgical microscope moved into place and focused.  Poor dilation of the iris was confirmed.  An inferotemporal paracentesis was created using a 20 gauge paracentesis blade.  Shugarcaine was injected into the anterior chamber.  Viscoelastic was injected into the anterior chamber.  A temporal clear-corneal main wound incision was created using a 2.21mm microkeratome.  A Malyugin ring was placed.  A continuous curvilinear capsulorrhexis was initiated using an irrigating cystitome and completed using capsulorrhexis forceps.  Hydrodissection and hydrodeliniation were performed.  Viscoelastic was injected into the anterior chamber.  A  phacoemulsification handpiece and a chopper as a second instrument were used to remove the nucleus and epinucleus. The irrigation/aspiration handpiece was used to remove any remaining cortical material.   The capsular bag was reinflated with viscoelastic, checked, and found to be intact.  The intraocular lens was inserted into the capsular bag and dialed into place using a Customer service manager. The Malyugin ring was removed.  The irrigation/aspiration handpiece was used to remove any remaining viscoelastic.  The clear corneal wound and paracentesis wounds were then hydrated and checked with Weck-Cels to be watertight. 0.22mL of moxifloxacin was injected into the anterior chamber.  The lid-speculum and drape was removed, and the patient's face was cleaned with a wet and dry 4x4. A clear shield was taped over the eye. The patient was taken to the post-operative care unit in good condition, having tolerated the procedure well.  Post-Op Instructions: The patient will follow up at [facility] for a same day post-operative evaluation and will receive all other orders and instructions.

## 2022-08-24 NOTE — Discharge Instructions (Signed)
Please discharge patient when stable, will follow up today with Dr. Shawneen Deetz at the [facility] office immediately following discharge.  Leave shield in place until visit.  All paperwork with discharge instructions will be given at the office.  [facility] Address:  [address]  [place], [place] [address]  

## 2022-08-24 NOTE — Anesthesia Procedure Notes (Signed)
Procedure Name: Intubation Date/Time: 08/24/2022 12:20 PM  Performed by: Jonna Munro, CRNAPre-anesthesia Checklist: Patient identified, Emergency Drugs available, Suction available, Patient being monitored and Timeout performed Patient Re-evaluated:Patient Re-evaluated prior to induction Oxygen Delivery Method: Circle system utilized Preoxygenation: Pre-oxygenation with 100% oxygen Induction Type: IV induction Ventilation: Two handed mask ventilation required Laryngoscope Size: Mac and 3 Grade View: Grade II Tube type: Oral Tube size: 7.5 mm Number of attempts: 1 Airway Equipment and Method: Stylet Placement Confirmation: ETT inserted through vocal cords under direct vision, positive ETCO2, CO2 detector and breath sounds checked- equal and bilateral Secured at: 23 cm Tube secured with: Tape Dental Injury: Teeth and Oropharynx as per pre-operative assessment

## 2022-08-24 NOTE — Anesthesia Preprocedure Evaluation (Signed)
Anesthesia Evaluation  Patient identified by MRN, date of birth, ID band Patient awake    Reviewed: Allergy & Precautions, H&P , NPO status , Patient's Chart, lab work & pertinent test results, reviewed documented beta blocker date and time   History of Anesthesia Complications (+) history of anesthetic complications  Airway Mallampati: II  TM Distance: >3 FB Neck ROM: full    Dental no notable dental hx.    Pulmonary asthma , sleep apnea , pneumonia, COPD, Current Smoker   Pulmonary exam normal breath sounds clear to auscultation       Cardiovascular Exercise Tolerance: Good hypertension, + CAD and + Past MI  + dysrhythmias  Rhythm:regular Rate:Normal     Neuro/Psych  PSYCHIATRIC DISORDERS Anxiety     negative neurological ROS     GI/Hepatic negative GI ROS, Neg liver ROS,,,  Endo/Other  negative endocrine ROSdiabetes, Type 2    Renal/GU negative Renal ROS  negative genitourinary   Musculoskeletal   Abdominal   Peds  Hematology negative hematology ROS (+)   Anesthesia Other Findings   Reproductive/Obstetrics negative OB ROS                              Anesthesia Physical Anesthesia Plan  ASA: 3  Anesthesia Plan: General and General LMA   Post-op Pain Management:    Induction:   PONV Risk Score and Plan: Ondansetron  Airway Management Planned:   Additional Equipment:   Intra-op Plan:   Post-operative Plan:   Informed Consent: I have reviewed the patients History and Physical, chart, labs and discussed the procedure including the risks, benefits and alternatives for the proposed anesthesia with the patient or authorized representative who has indicated his/her understanding and acceptance.     Dental Advisory Given  Plan Discussed with: CRNA  Anesthesia Plan Comments:          Anesthesia Quick Evaluation

## 2022-08-24 NOTE — Transfer of Care (Signed)
Immediate Anesthesia Transfer of Care Note  Patient: Bryan Blair  Procedure(s) Performed: CATARACT EXTRACTION PHACO AND INTRAOCULAR LENS PLACEMENT (IOC) (Left: Eye)  Patient Location: Short Stay  Anesthesia Type:General  Level of Consciousness: awake, alert , oriented, and patient cooperative  Airway & Oxygen Therapy: Patient Spontanous Breathing and Patient connected to nasal cannula oxygen  Post-op Assessment: Report given to RN, Post -op Vital signs reviewed and stable, and Patient moving all extremities X 4  Post vital signs: Reviewed and stable  Last Vitals:  Vitals Value Taken Time  BP    Temp    Pulse 90 08/24/22 1259  Resp 24 08/24/22 1259  SpO2 82 % 08/24/22 1259  Vitals shown include unvalidated device data.  Last Pain:  Vitals:   08/24/22 1139  TempSrc: Oral  PainSc: 0-No pain         Complications: No notable events documented.

## 2022-08-24 NOTE — Interval H&P Note (Signed)
History and Physical Interval Note:  08/24/2022 11:55 AM  Bryan Blair  has presented today for surgery, with the diagnosis of nuclear sclerosis age related cataract; left.  The various methods of treatment have been discussed with the patient and family. After consideration of risks, benefits and other options for treatment, the patient has consented to  Procedure(s) with comments: CATARACT EXTRACTION PHACO AND INTRAOCULAR LENS PLACEMENT (IOC) (Left) - general anesthesia / please schedule preop 11/15 as a surgical intervention.  The patient's history has been reviewed, patient examined, no change in status, stable for surgery.  I have reviewed the patient's chart and labs.  Questions were answered to the patient's satisfaction.     [name]

## 2022-08-25 NOTE — Anesthesia Postprocedure Evaluation (Signed)
Anesthesia Post Note  Patient: Bryan Blair  Procedure(s) Performed: CATARACT EXTRACTION PHACO AND INTRAOCULAR LENS PLACEMENT (IOC) (Left: Eye)  Patient location during evaluation: Phase II Anesthesia Type: General Level of consciousness: awake Pain management: pain level controlled Vital Signs Assessment: post-procedure vital signs reviewed and stable Respiratory status: spontaneous breathing and respiratory function stable Cardiovascular status: blood pressure returned to baseline and stable Postop Assessment: no headache and no apparent nausea or vomiting Anesthetic complications: no Comments: Late entry   No notable events documented.   Last Vitals:  Vitals:   08/24/22 1315 08/24/22 1326  BP: 121/68 118/68  Pulse: 87 87  Resp: 20 20  Temp:  36.8 C  SpO2: 94% 92%    Last Pain:  Vitals:   08/24/22 1326  TempSrc: Oral  PainSc: 0-No pain                 Windell Norfolk

## 2022-09-03 ENCOUNTER — Encounter (HOSPITAL_COMMUNITY): Payer: Self-pay | Admitting: Ophthalmology

## 2022-10-02 DIAGNOSIS — J111 Influenza due to unidentified influenza virus with other respiratory manifestations: Secondary | ICD-10-CM | POA: Diagnosis not present

## 2022-10-06 DIAGNOSIS — N179 Acute kidney failure, unspecified: Secondary | ICD-10-CM | POA: Diagnosis not present

## 2022-10-06 DIAGNOSIS — R590 Localized enlarged lymph nodes: Secondary | ICD-10-CM | POA: Diagnosis not present

## 2022-10-06 DIAGNOSIS — K449 Diaphragmatic hernia without obstruction or gangrene: Secondary | ICD-10-CM | POA: Diagnosis not present

## 2022-10-06 DIAGNOSIS — I255 Ischemic cardiomyopathy: Secondary | ICD-10-CM | POA: Diagnosis not present

## 2022-10-06 DIAGNOSIS — Z6841 Body Mass Index (BMI) 40.0 and over, adult: Secondary | ICD-10-CM | POA: Diagnosis not present

## 2022-10-06 DIAGNOSIS — I110 Hypertensive heart disease with heart failure: Secondary | ICD-10-CM | POA: Diagnosis not present

## 2022-10-06 DIAGNOSIS — R0602 Shortness of breath: Secondary | ICD-10-CM | POA: Diagnosis not present

## 2022-10-06 DIAGNOSIS — J441 Chronic obstructive pulmonary disease with (acute) exacerbation: Secondary | ICD-10-CM | POA: Diagnosis not present

## 2022-10-06 DIAGNOSIS — J440 Chronic obstructive pulmonary disease with acute lower respiratory infection: Secondary | ICD-10-CM | POA: Diagnosis not present

## 2022-10-06 DIAGNOSIS — A419 Sepsis, unspecified organism: Secondary | ICD-10-CM | POA: Diagnosis not present

## 2022-10-06 DIAGNOSIS — I2510 Atherosclerotic heart disease of native coronary artery without angina pectoris: Secondary | ICD-10-CM | POA: Diagnosis not present

## 2022-10-06 DIAGNOSIS — R531 Weakness: Secondary | ICD-10-CM | POA: Diagnosis not present

## 2022-10-06 DIAGNOSIS — E1165 Type 2 diabetes mellitus with hyperglycemia: Secondary | ICD-10-CM | POA: Diagnosis not present

## 2022-10-06 DIAGNOSIS — R918 Other nonspecific abnormal finding of lung field: Secondary | ICD-10-CM | POA: Diagnosis not present

## 2022-10-06 DIAGNOSIS — E870 Hyperosmolality and hypernatremia: Secondary | ICD-10-CM | POA: Diagnosis not present

## 2022-10-06 DIAGNOSIS — Z9911 Dependence on respirator [ventilator] status: Secondary | ICD-10-CM | POA: Diagnosis not present

## 2022-10-06 DIAGNOSIS — Z4682 Encounter for fitting and adjustment of non-vascular catheter: Secondary | ICD-10-CM | POA: Diagnosis not present

## 2022-10-06 DIAGNOSIS — I272 Pulmonary hypertension, unspecified: Secondary | ICD-10-CM | POA: Diagnosis not present

## 2022-10-06 DIAGNOSIS — I083 Combined rheumatic disorders of mitral, aortic and tricuspid valves: Secondary | ICD-10-CM | POA: Diagnosis not present

## 2022-10-06 DIAGNOSIS — F17210 Nicotine dependence, cigarettes, uncomplicated: Secondary | ICD-10-CM | POA: Diagnosis not present

## 2022-10-06 DIAGNOSIS — J154 Pneumonia due to other streptococci: Secondary | ICD-10-CM | POA: Diagnosis not present

## 2022-10-06 DIAGNOSIS — R509 Fever, unspecified: Secondary | ICD-10-CM | POA: Diagnosis not present

## 2022-10-06 DIAGNOSIS — G7281 Critical illness myopathy: Secondary | ICD-10-CM | POA: Diagnosis not present

## 2022-10-06 DIAGNOSIS — I472 Ventricular tachycardia, unspecified: Secondary | ICD-10-CM | POA: Diagnosis not present

## 2022-10-06 DIAGNOSIS — E872 Acidosis, unspecified: Secondary | ICD-10-CM | POA: Diagnosis not present

## 2022-10-06 DIAGNOSIS — R6520 Severe sepsis without septic shock: Secondary | ICD-10-CM | POA: Diagnosis not present

## 2022-10-06 DIAGNOSIS — J9622 Acute and chronic respiratory failure with hypercapnia: Secondary | ICD-10-CM | POA: Diagnosis not present

## 2022-10-06 DIAGNOSIS — I21A1 Myocardial infarction type 2: Secondary | ICD-10-CM | POA: Diagnosis not present

## 2022-10-06 DIAGNOSIS — J9621 Acute and chronic respiratory failure with hypoxia: Secondary | ICD-10-CM | POA: Diagnosis not present

## 2022-10-06 DIAGNOSIS — Z20822 Contact with and (suspected) exposure to covid-19: Secondary | ICD-10-CM | POA: Diagnosis not present

## 2022-10-06 DIAGNOSIS — I5020 Unspecified systolic (congestive) heart failure: Secondary | ICD-10-CM | POA: Diagnosis not present

## 2022-10-07 DIAGNOSIS — R918 Other nonspecific abnormal finding of lung field: Secondary | ICD-10-CM | POA: Diagnosis not present

## 2022-10-07 DIAGNOSIS — J9690 Respiratory failure, unspecified, unspecified whether with hypoxia or hypercapnia: Secondary | ICD-10-CM | POA: Diagnosis not present

## 2022-10-07 DIAGNOSIS — R7989 Other specified abnormal findings of blood chemistry: Secondary | ICD-10-CM | POA: Diagnosis not present

## 2022-10-07 DIAGNOSIS — J984 Other disorders of lung: Secondary | ICD-10-CM | POA: Diagnosis not present

## 2022-10-07 DIAGNOSIS — J90 Pleural effusion, not elsewhere classified: Secondary | ICD-10-CM | POA: Diagnosis not present

## 2022-10-07 DIAGNOSIS — Z9911 Dependence on respirator [ventilator] status: Secondary | ICD-10-CM | POA: Diagnosis not present

## 2022-10-08 DIAGNOSIS — J984 Other disorders of lung: Secondary | ICD-10-CM | POA: Diagnosis not present

## 2022-10-08 DIAGNOSIS — R0689 Other abnormalities of breathing: Secondary | ICD-10-CM | POA: Diagnosis not present

## 2022-10-08 DIAGNOSIS — R918 Other nonspecific abnormal finding of lung field: Secondary | ICD-10-CM | POA: Diagnosis not present

## 2022-10-09 DIAGNOSIS — D649 Anemia, unspecified: Secondary | ICD-10-CM | POA: Diagnosis not present

## 2022-10-09 DIAGNOSIS — Z4682 Encounter for fitting and adjustment of non-vascular catheter: Secondary | ICD-10-CM | POA: Diagnosis not present

## 2022-10-09 DIAGNOSIS — R0689 Other abnormalities of breathing: Secondary | ICD-10-CM | POA: Diagnosis not present

## 2022-10-09 DIAGNOSIS — I2510 Atherosclerotic heart disease of native coronary artery without angina pectoris: Secondary | ICD-10-CM | POA: Diagnosis not present

## 2022-10-09 DIAGNOSIS — J90 Pleural effusion, not elsewhere classified: Secondary | ICD-10-CM | POA: Diagnosis not present

## 2022-10-09 DIAGNOSIS — J9600 Acute respiratory failure, unspecified whether with hypoxia or hypercapnia: Secondary | ICD-10-CM | POA: Diagnosis not present

## 2022-10-09 DIAGNOSIS — R918 Other nonspecific abnormal finding of lung field: Secondary | ICD-10-CM | POA: Diagnosis not present

## 2022-10-10 DIAGNOSIS — D649 Anemia, unspecified: Secondary | ICD-10-CM | POA: Diagnosis not present

## 2022-10-10 DIAGNOSIS — J189 Pneumonia, unspecified organism: Secondary | ICD-10-CM | POA: Diagnosis not present

## 2022-10-10 DIAGNOSIS — J9600 Acute respiratory failure, unspecified whether with hypoxia or hypercapnia: Secondary | ICD-10-CM | POA: Diagnosis not present

## 2022-10-10 DIAGNOSIS — I2510 Atherosclerotic heart disease of native coronary artery without angina pectoris: Secondary | ICD-10-CM | POA: Diagnosis not present

## 2022-10-10 DIAGNOSIS — R918 Other nonspecific abnormal finding of lung field: Secondary | ICD-10-CM | POA: Diagnosis not present

## 2022-10-10 DIAGNOSIS — J984 Other disorders of lung: Secondary | ICD-10-CM | POA: Diagnosis not present

## 2022-10-11 DIAGNOSIS — R0902 Hypoxemia: Secondary | ICD-10-CM | POA: Diagnosis not present

## 2022-10-11 DIAGNOSIS — R918 Other nonspecific abnormal finding of lung field: Secondary | ICD-10-CM | POA: Diagnosis not present

## 2022-10-11 DIAGNOSIS — I509 Heart failure, unspecified: Secondary | ICD-10-CM | POA: Diagnosis not present

## 2022-10-11 DIAGNOSIS — Z452 Encounter for adjustment and management of vascular access device: Secondary | ICD-10-CM | POA: Diagnosis not present

## 2022-10-11 DIAGNOSIS — Z4682 Encounter for fitting and adjustment of non-vascular catheter: Secondary | ICD-10-CM | POA: Diagnosis not present

## 2022-10-12 DIAGNOSIS — J449 Chronic obstructive pulmonary disease, unspecified: Secondary | ICD-10-CM | POA: Diagnosis not present

## 2022-10-12 DIAGNOSIS — J13 Pneumonia due to Streptococcus pneumoniae: Secondary | ICD-10-CM | POA: Diagnosis not present

## 2022-10-13 DIAGNOSIS — J984 Other disorders of lung: Secondary | ICD-10-CM | POA: Diagnosis not present

## 2022-10-13 DIAGNOSIS — J189 Pneumonia, unspecified organism: Secondary | ICD-10-CM | POA: Diagnosis not present

## 2022-10-13 DIAGNOSIS — J449 Chronic obstructive pulmonary disease, unspecified: Secondary | ICD-10-CM | POA: Diagnosis not present

## 2022-10-13 DIAGNOSIS — J13 Pneumonia due to Streptococcus pneumoniae: Secondary | ICD-10-CM | POA: Diagnosis not present

## 2022-10-14 DIAGNOSIS — J13 Pneumonia due to Streptococcus pneumoniae: Secondary | ICD-10-CM | POA: Diagnosis not present

## 2022-10-14 DIAGNOSIS — Z4682 Encounter for fitting and adjustment of non-vascular catheter: Secondary | ICD-10-CM | POA: Diagnosis not present

## 2022-10-14 DIAGNOSIS — R918 Other nonspecific abnormal finding of lung field: Secondary | ICD-10-CM | POA: Diagnosis not present

## 2022-10-14 DIAGNOSIS — J449 Chronic obstructive pulmonary disease, unspecified: Secondary | ICD-10-CM | POA: Diagnosis not present

## 2022-10-15 DIAGNOSIS — J449 Chronic obstructive pulmonary disease, unspecified: Secondary | ICD-10-CM | POA: Diagnosis not present

## 2022-10-15 DIAGNOSIS — J13 Pneumonia due to Streptococcus pneumoniae: Secondary | ICD-10-CM | POA: Diagnosis not present

## 2022-10-16 DIAGNOSIS — J13 Pneumonia due to Streptococcus pneumoniae: Secondary | ICD-10-CM | POA: Diagnosis not present

## 2022-10-16 DIAGNOSIS — J449 Chronic obstructive pulmonary disease, unspecified: Secondary | ICD-10-CM | POA: Diagnosis not present

## 2022-10-16 DIAGNOSIS — J9600 Acute respiratory failure, unspecified whether with hypoxia or hypercapnia: Secondary | ICD-10-CM | POA: Diagnosis not present

## 2022-10-16 DIAGNOSIS — N179 Acute kidney failure, unspecified: Secondary | ICD-10-CM | POA: Diagnosis not present

## 2022-10-16 DIAGNOSIS — I2510 Atherosclerotic heart disease of native coronary artery without angina pectoris: Secondary | ICD-10-CM | POA: Diagnosis not present

## 2022-10-16 DIAGNOSIS — Z9911 Dependence on respirator [ventilator] status: Secondary | ICD-10-CM | POA: Diagnosis not present

## 2022-10-16 DIAGNOSIS — I214 Non-ST elevation (NSTEMI) myocardial infarction: Secondary | ICD-10-CM | POA: Diagnosis not present

## 2022-10-16 DIAGNOSIS — J189 Pneumonia, unspecified organism: Secondary | ICD-10-CM | POA: Diagnosis not present

## 2022-10-16 DIAGNOSIS — J441 Chronic obstructive pulmonary disease with (acute) exacerbation: Secondary | ICD-10-CM | POA: Diagnosis not present

## 2022-10-16 DIAGNOSIS — G7281 Critical illness myopathy: Secondary | ICD-10-CM | POA: Diagnosis not present

## 2022-10-17 DIAGNOSIS — I2510 Atherosclerotic heart disease of native coronary artery without angina pectoris: Secondary | ICD-10-CM | POA: Diagnosis not present

## 2022-10-17 DIAGNOSIS — G7281 Critical illness myopathy: Secondary | ICD-10-CM | POA: Diagnosis not present

## 2022-10-17 DIAGNOSIS — J13 Pneumonia due to Streptococcus pneumoniae: Secondary | ICD-10-CM | POA: Diagnosis not present

## 2022-10-17 DIAGNOSIS — J189 Pneumonia, unspecified organism: Secondary | ICD-10-CM | POA: Diagnosis not present

## 2022-10-17 DIAGNOSIS — N179 Acute kidney failure, unspecified: Secondary | ICD-10-CM | POA: Diagnosis not present

## 2022-10-17 DIAGNOSIS — J449 Chronic obstructive pulmonary disease, unspecified: Secondary | ICD-10-CM | POA: Diagnosis not present

## 2022-10-17 DIAGNOSIS — J441 Chronic obstructive pulmonary disease with (acute) exacerbation: Secondary | ICD-10-CM | POA: Diagnosis not present

## 2022-10-18 DIAGNOSIS — J449 Chronic obstructive pulmonary disease, unspecified: Secondary | ICD-10-CM | POA: Diagnosis not present

## 2022-10-18 DIAGNOSIS — J13 Pneumonia due to Streptococcus pneumoniae: Secondary | ICD-10-CM | POA: Diagnosis not present

## 2022-10-19 DIAGNOSIS — J9621 Acute and chronic respiratory failure with hypoxia: Secondary | ICD-10-CM | POA: Diagnosis not present

## 2022-10-19 DIAGNOSIS — N179 Acute kidney failure, unspecified: Secondary | ICD-10-CM | POA: Diagnosis not present

## 2022-10-19 DIAGNOSIS — I5022 Chronic systolic (congestive) heart failure: Secondary | ICD-10-CM | POA: Diagnosis not present

## 2022-10-19 DIAGNOSIS — I214 Non-ST elevation (NSTEMI) myocardial infarction: Secondary | ICD-10-CM | POA: Diagnosis not present

## 2022-10-19 DIAGNOSIS — B351 Tinea unguium: Secondary | ICD-10-CM | POA: Diagnosis not present

## 2022-10-19 DIAGNOSIS — R001 Bradycardia, unspecified: Secondary | ICD-10-CM | POA: Diagnosis not present

## 2022-10-19 DIAGNOSIS — M549 Dorsalgia, unspecified: Secondary | ICD-10-CM | POA: Diagnosis not present

## 2022-10-19 DIAGNOSIS — H548 Legal blindness, as defined in USA: Secondary | ICD-10-CM | POA: Diagnosis not present

## 2022-10-19 DIAGNOSIS — E1165 Type 2 diabetes mellitus with hyperglycemia: Secondary | ICD-10-CM | POA: Diagnosis not present

## 2022-10-19 DIAGNOSIS — J9622 Acute and chronic respiratory failure with hypercapnia: Secondary | ICD-10-CM | POA: Diagnosis not present

## 2022-10-19 DIAGNOSIS — Z6841 Body Mass Index (BMI) 40.0 and over, adult: Secondary | ICD-10-CM | POA: Diagnosis not present

## 2022-10-19 DIAGNOSIS — I429 Cardiomyopathy, unspecified: Secondary | ICD-10-CM | POA: Diagnosis not present

## 2022-10-19 DIAGNOSIS — J441 Chronic obstructive pulmonary disease with (acute) exacerbation: Secondary | ICD-10-CM | POA: Diagnosis not present

## 2022-10-19 DIAGNOSIS — R4189 Other symptoms and signs involving cognitive functions and awareness: Secondary | ICD-10-CM | POA: Diagnosis not present

## 2022-10-19 DIAGNOSIS — J449 Chronic obstructive pulmonary disease, unspecified: Secondary | ICD-10-CM | POA: Diagnosis not present

## 2022-10-19 DIAGNOSIS — I21A1 Myocardial infarction type 2: Secondary | ICD-10-CM | POA: Diagnosis not present

## 2022-10-19 DIAGNOSIS — G7281 Critical illness myopathy: Secondary | ICD-10-CM | POA: Diagnosis not present

## 2022-10-19 DIAGNOSIS — J13 Pneumonia due to Streptococcus pneumoniae: Secondary | ICD-10-CM | POA: Diagnosis not present

## 2022-10-19 DIAGNOSIS — J189 Pneumonia, unspecified organism: Secondary | ICD-10-CM | POA: Diagnosis not present

## 2022-10-19 DIAGNOSIS — E119 Type 2 diabetes mellitus without complications: Secondary | ICD-10-CM | POA: Diagnosis not present

## 2022-10-19 DIAGNOSIS — I2510 Atherosclerotic heart disease of native coronary artery without angina pectoris: Secondary | ICD-10-CM | POA: Diagnosis not present

## 2022-10-19 DIAGNOSIS — E785 Hyperlipidemia, unspecified: Secondary | ICD-10-CM | POA: Diagnosis not present

## 2022-10-20 DIAGNOSIS — Z9911 Dependence on respirator [ventilator] status: Secondary | ICD-10-CM | POA: Diagnosis not present

## 2022-10-20 DIAGNOSIS — J441 Chronic obstructive pulmonary disease with (acute) exacerbation: Secondary | ICD-10-CM | POA: Diagnosis not present

## 2022-10-20 DIAGNOSIS — I2510 Atherosclerotic heart disease of native coronary artery without angina pectoris: Secondary | ICD-10-CM | POA: Diagnosis not present

## 2022-10-20 DIAGNOSIS — R1310 Dysphagia, unspecified: Secondary | ICD-10-CM | POA: Diagnosis not present

## 2022-10-20 DIAGNOSIS — J9600 Acute respiratory failure, unspecified whether with hypoxia or hypercapnia: Secondary | ICD-10-CM | POA: Diagnosis not present

## 2022-10-20 DIAGNOSIS — G7281 Critical illness myopathy: Secondary | ICD-10-CM | POA: Diagnosis not present

## 2022-10-20 DIAGNOSIS — J189 Pneumonia, unspecified organism: Secondary | ICD-10-CM | POA: Diagnosis not present

## 2022-10-20 DIAGNOSIS — N179 Acute kidney failure, unspecified: Secondary | ICD-10-CM | POA: Diagnosis not present

## 2022-10-22 DIAGNOSIS — N179 Acute kidney failure, unspecified: Secondary | ICD-10-CM | POA: Diagnosis not present

## 2022-10-22 DIAGNOSIS — I2510 Atherosclerotic heart disease of native coronary artery without angina pectoris: Secondary | ICD-10-CM | POA: Diagnosis not present

## 2022-10-22 DIAGNOSIS — G7281 Critical illness myopathy: Secondary | ICD-10-CM | POA: Diagnosis not present

## 2022-10-22 DIAGNOSIS — J441 Chronic obstructive pulmonary disease with (acute) exacerbation: Secondary | ICD-10-CM | POA: Diagnosis not present

## 2022-10-25 DIAGNOSIS — J441 Chronic obstructive pulmonary disease with (acute) exacerbation: Secondary | ICD-10-CM | POA: Diagnosis not present

## 2022-10-25 DIAGNOSIS — G7281 Critical illness myopathy: Secondary | ICD-10-CM | POA: Diagnosis not present

## 2022-10-25 DIAGNOSIS — I2510 Atherosclerotic heart disease of native coronary artery without angina pectoris: Secondary | ICD-10-CM | POA: Diagnosis not present

## 2022-10-25 DIAGNOSIS — N179 Acute kidney failure, unspecified: Secondary | ICD-10-CM | POA: Diagnosis not present

## 2022-10-25 DIAGNOSIS — E119 Type 2 diabetes mellitus without complications: Secondary | ICD-10-CM | POA: Diagnosis not present

## 2022-10-27 DIAGNOSIS — N179 Acute kidney failure, unspecified: Secondary | ICD-10-CM | POA: Diagnosis not present

## 2022-10-27 DIAGNOSIS — J441 Chronic obstructive pulmonary disease with (acute) exacerbation: Secondary | ICD-10-CM | POA: Diagnosis not present

## 2022-10-27 DIAGNOSIS — I2510 Atherosclerotic heart disease of native coronary artery without angina pectoris: Secondary | ICD-10-CM | POA: Diagnosis not present

## 2022-10-27 DIAGNOSIS — G7281 Critical illness myopathy: Secondary | ICD-10-CM | POA: Diagnosis not present

## 2022-10-29 DIAGNOSIS — G7281 Critical illness myopathy: Secondary | ICD-10-CM | POA: Diagnosis not present

## 2022-10-29 DIAGNOSIS — J441 Chronic obstructive pulmonary disease with (acute) exacerbation: Secondary | ICD-10-CM | POA: Diagnosis not present

## 2022-10-29 DIAGNOSIS — N179 Acute kidney failure, unspecified: Secondary | ICD-10-CM | POA: Diagnosis not present

## 2022-10-29 DIAGNOSIS — I2510 Atherosclerotic heart disease of native coronary artery without angina pectoris: Secondary | ICD-10-CM | POA: Diagnosis not present

## 2022-10-30 DIAGNOSIS — N179 Acute kidney failure, unspecified: Secondary | ICD-10-CM | POA: Diagnosis not present

## 2022-10-30 DIAGNOSIS — I2510 Atherosclerotic heart disease of native coronary artery without angina pectoris: Secondary | ICD-10-CM | POA: Diagnosis not present

## 2022-10-30 DIAGNOSIS — J441 Chronic obstructive pulmonary disease with (acute) exacerbation: Secondary | ICD-10-CM | POA: Diagnosis not present

## 2022-10-30 DIAGNOSIS — G7281 Critical illness myopathy: Secondary | ICD-10-CM | POA: Diagnosis not present

## 2022-11-28 DIAGNOSIS — Z125 Encounter for screening for malignant neoplasm of prostate: Secondary | ICD-10-CM | POA: Diagnosis not present

## 2022-11-28 DIAGNOSIS — F17210 Nicotine dependence, cigarettes, uncomplicated: Secondary | ICD-10-CM | POA: Diagnosis not present

## 2022-11-28 DIAGNOSIS — J189 Pneumonia, unspecified organism: Secondary | ICD-10-CM | POA: Diagnosis not present

## 2022-11-28 DIAGNOSIS — E1165 Type 2 diabetes mellitus with hyperglycemia: Secondary | ICD-10-CM | POA: Diagnosis not present

## 2022-11-28 DIAGNOSIS — J449 Chronic obstructive pulmonary disease, unspecified: Secondary | ICD-10-CM | POA: Diagnosis not present

## 2022-11-28 DIAGNOSIS — I214 Non-ST elevation (NSTEMI) myocardial infarction: Secondary | ICD-10-CM | POA: Diagnosis not present

## 2022-11-28 DIAGNOSIS — R918 Other nonspecific abnormal finding of lung field: Secondary | ICD-10-CM | POA: Diagnosis not present

## 2022-11-28 DIAGNOSIS — J9690 Respiratory failure, unspecified, unspecified whether with hypoxia or hypercapnia: Secondary | ICD-10-CM | POA: Diagnosis not present

## 2022-11-28 DIAGNOSIS — Z1159 Encounter for screening for other viral diseases: Secondary | ICD-10-CM | POA: Diagnosis not present

## 2022-11-28 DIAGNOSIS — I10 Essential (primary) hypertension: Secondary | ICD-10-CM | POA: Diagnosis not present

## 2022-11-29 ENCOUNTER — Other Ambulatory Visit (HOSPITAL_COMMUNITY): Payer: Self-pay | Admitting: Physician Assistant

## 2022-11-29 DIAGNOSIS — R918 Other nonspecific abnormal finding of lung field: Secondary | ICD-10-CM

## 2022-12-04 ENCOUNTER — Ambulatory Visit (HOSPITAL_COMMUNITY)
Admission: RE | Admit: 2022-12-04 | Discharge: 2022-12-04 | Disposition: A | Discharge status: Home / Self Care | Payer: No Typology Code available for payment source | Source: Ambulatory Visit | Attending: Physician Assistant | Admitting: Physician Assistant

## 2022-12-04 DIAGNOSIS — R918 Other nonspecific abnormal finding of lung field: Secondary | ICD-10-CM | POA: Diagnosis present

## 2022-12-04 DIAGNOSIS — R911 Solitary pulmonary nodule: Secondary | ICD-10-CM | POA: Diagnosis not present

## 2022-12-04 DIAGNOSIS — J479 Bronchiectasis, uncomplicated: Secondary | ICD-10-CM | POA: Diagnosis not present

## 2022-12-04 LAB — POCT I-STAT CREATININE: Creatinine, Ser: 1.2 mg/dL (ref 0.61–1.24)

## 2022-12-04 MED ORDER — DIPHENHYDRAMINE HCL 25 MG PO CAPS: 50.0000 mg | ORAL_CAPSULE | Freq: Once | ORAL | Status: AC

## 2022-12-04 MED ORDER — IOHEXOL 300 MG/ML  SOLN
75.0000 mL | Freq: Once | INTRAMUSCULAR | Status: AC | PRN
  Administered 2022-12-04: 75 mL via INTRAVENOUS

## 2022-12-04 MED ORDER — DIPHENHYDRAMINE HCL 25 MG PO CAPS
ORAL_CAPSULE | ORAL | Status: AC
  Administered 2022-12-04: 50 mg via ORAL
  Filled 2022-12-04: qty 2

## 2022-12-12 ENCOUNTER — Encounter: Payer: Self-pay | Admitting: Internal Medicine

## 2022-12-12 DIAGNOSIS — R911 Solitary pulmonary nodule: Secondary | ICD-10-CM | POA: Insufficient documentation

## 2022-12-14 ENCOUNTER — Other Ambulatory Visit: Payer: Self-pay | Admitting: Diagnostic Radiology

## 2022-12-14 ENCOUNTER — Ambulatory Visit
Admission: RE | Admit: 2022-12-14 | Discharge: 2022-12-14 | Disposition: A | Discharge status: Home / Self Care | Payer: Self-pay | Source: Ambulatory Visit | Attending: Diagnostic Radiology | Admitting: Diagnostic Radiology

## 2022-12-14 DIAGNOSIS — R918 Other nonspecific abnormal finding of lung field: Secondary | ICD-10-CM

## 2022-12-14 DIAGNOSIS — H524 Presbyopia: Secondary | ICD-10-CM | POA: Diagnosis not present

## 2022-12-14 DIAGNOSIS — H52223 Regular astigmatism, bilateral: Secondary | ICD-10-CM | POA: Diagnosis not present

## 2022-12-27 ENCOUNTER — Ambulatory Visit (INDEPENDENT_AMBULATORY_CARE_PROVIDER_SITE_OTHER): Payer: No Typology Code available for payment source | Admitting: Internal Medicine

## 2022-12-27 ENCOUNTER — Encounter: Payer: Self-pay | Admitting: Internal Medicine

## 2022-12-27 VITALS — BP 118/76 | HR 85 | Ht 67.0 in | Wt 321.2 lb

## 2022-12-27 DIAGNOSIS — F17210 Nicotine dependence, cigarettes, uncomplicated: Secondary | ICD-10-CM | POA: Diagnosis not present

## 2022-12-27 DIAGNOSIS — J4489 Other specified chronic obstructive pulmonary disease: Secondary | ICD-10-CM

## 2022-12-27 DIAGNOSIS — R918 Other nonspecific abnormal finding of lung field: Secondary | ICD-10-CM

## 2022-12-27 MED ORDER — TRELEGY ELLIPTA 100-62.5-25 MCG/ACT IN AEPB: 1.0000 | INHALATION_SPRAY | Freq: Every day | RESPIRATORY_TRACT | 6 refills | Status: DC

## 2022-12-27 NOTE — Assessment & Plan Note (Signed)
Body mass index is 50.31 kg/m.  -  trending up  No results found for: "TSH"    Contributing to doe and risk of GERD >>>   reviewed the need and the process to achieve and maintain neg calorie balance > defer f/u primary care including intermittently monitoring thyroid status       Each maintenance medication was reviewed in detail including emphasizing most importantly the difference between maintenance and prns and under what circumstances the prns are to be triggered using an action plan format where appropriate.  Total time for H and P, chart review, counseling, reviewing hfa/dpi device(s) , directly observing portions of ambulatory 02 saturation study/ and generating customized AVS unique to this office visit / same day charting > 40 min post hosp f/u ov

## 2022-12-27 NOTE — Assessment & Plan Note (Signed)
S/p CAP [place] xmas 2023 > 1 m admit> d/c off 02 12/04/22 Ct c/w partial clearing rec 3 m f/u > 03/04/23   Chest CT 12/04/22     ADDENDUM: Outside CT chest 10/06/2022 is available for comparison. Marked improvement in multilobar consolidation, worst in the left upper lobe, as well as in extensive peribronchovascular nodularity, without complete resolution  >>> rec f/u 3 m = 02/2723   In setting of acute pna most likely all these findings are fibroproliferative in nature but will need a final f/u then start on the LDSCT if willing  Discussed in detail all the  indications, usual  risks and alternatives  relative to the benefits with patient who agrees to proceed with w/u as outlined.

## 2022-12-27 NOTE — Assessment & Plan Note (Addendum)
Counseled re importance of smoking cessation but did not meet time criteria for separate billing   °

## 2022-12-27 NOTE — Progress Notes (Signed)
Bryan Blair, male    DOB: [birth year],   MRN: [id number]   Brief patient profile:  62   yowm  active smoker  with nl pfts in 2015 (at wt 320) when by [facility]  referred to pulmonary clinic in [place]  11/07/2021 by  Bryan Blair for doe      History of Present Illness  11/07/2021  Pulmonary/ 1st office eval/ Bryan Blair / [facility]  Chief Complaint  Patient presents with   Consult    Patient has seen Bryan Blair pulmonary with [facility]. Here to establish care in [place], recently moved.  Ref for COPD.   Dyspnea:  mb 100 ft flat has to stop when gets to mailbox or back to house  Can still do [employer]  Cough: smoker's cough w/in 30 min > mucoid  now thruout day due to "crud" 2.5 weeks - taking subtherapeutic mucinex  Sleep: in recline  around 45 degrees  x  40s / can't wear any form of cpap and resltess/ insomnia without excess hypersomnolence  SABA use: once a month  Rec Plan A = Automatic = Always=    Trelegy one click each am or breztri 2 every 12 hours  Work on inhaler technique:  Zpak  Plan B = Backup (to supplement plan A, not to replace it) Only use your albuterol inhaler as a rescue medication Ok to try albuterol 15 min before an activity (on alternating days)  that you know would usually make you short of breath For cough / congestion > mucinex 1200 mg twice daily  The key is to stop smoking completely before smoking completely stops you! Actos may be contributing to fluid retention and breathing problems  Please schedule a follow up visit in 3 months but call sooner if needed - PFT's on return   Dec 28 - Jan 28 admitted with bilateral pna > d/c no 02 and returned to [place] end of Feb 2024 by plane   12/27/2022  f/u ov/Bryan Blair office/Bryan Blair re: AB/ still smoking 6 cigs/ day maint on Trelegy   Chief Complaint  Patient presents with   Follow-up    Pt HFU was hospitalized in [place] he was sick during the holidays (12/22) he was admitted and on a vent for approx 12-15 days.  Returned home the end of February. While admitted pt had CT scan that showed a mass in his lung  Dyspnea:  able to go with walker to MB stops half way due to sob / also one end  Cough:   some rattling in am's min white mucus  Sleeping: bed 6 in / 2 pillows SABA use: none at all  02: none    No obvious day to day or daytime variability or assoc excess/ purulent sputum or mucus plugs or hemoptysis or cp or chest tightness, subjective wheeze or overt sinus or hb symptoms.   Sleeping  without nocturnal  or early am exacerbation  of respiratory  c/o's or need for noct saba. Also denies any obvious fluctuation of symptoms with weather or environmental changes or other aggravating or alleviating factors except as outlined above   No unusual exposure hx or h/o childhood pna/ asthma or knowledge of premature birth.  Current Allergies, Complete Past Medical History, Past Surgical History, Family History, and Social History were reviewed in [employer] record.  ROS  The following are not active complaints unless bolded Hoarseness, sore throat, dysphagia, dental problems, itching, sneezing,  nasal congestion or discharge of excess mucus or  purulent secretions, ear ache,   fever, chills, sweats, unintended wt loss or wt gain, classically pleuritic or exertional cp,  orthopnea pnd or arm/hand swelling  or leg swelling, presyncope, palpitations, abdominal pain, anorexia, nausea, vomiting, diarrhea  or change in bowel habits or change in bladder habits, change in stools or change in urine, dysuria, hematuria,  rash, arthralgias, visual complaints, headache, numbness, weakness or ataxia or problems with walking or coordination,  change in mood or  memory.        Current Meds  Medication Sig   acetaminophen (TYLENOL) 325 MG tablet Take 650 mg by mouth 2 (two) times daily.   albuterol (VENTOLIN HFA) 108 (90 Base) MCG/ACT inhaler Inhale 1-2 puffs into the lungs every 6 (six) hours as needed  for wheezing or shortness of breath.   ALPRAZolam (XANAX) 0.5 MG tablet Take 0.5 mg by mouth 3 (three) times daily as needed for anxiety.   famotidine (PEPCID) 20 MG tablet Take 20 mg by mouth daily as needed for heartburn.   Fluticasone-Umeclidin-Vilant (TRELEGY ELLIPTA) 100-62.5-25 MCG/INH AEPB Inhale into the lungs daily.   furosemide (LASIX) 20 MG tablet Take 1 tablet (20 mg total) by mouth daily as needed for edema.   glipiZIDE (GLUCOTROL XL) 10 MG 24 hr tablet Take 10 mg by mouth 2 (two) times daily.   guaiFENesin (MUCINEX) 600 MG 12 hr tablet Take 600 mg by mouth 2 (two) times daily.   ibuprofen (ADVIL) 200 MG tablet Take 200 mg by mouth every 8 (eight) hours as needed for mild pain.   isosorbide mononitrate (IMDUR) 30 MG 24 hr tablet Take 30 mg by mouth daily.   losartan (COZAAR) 25 MG tablet Take 25 mg by mouth daily.   Multiple Vitamin (MULTIVITAMIN WITH MINERALS) TABS tablet Take 1 tablet by mouth daily.   nitroGLYCERIN (NITROSTAT) 0.4 MG SL tablet Place 0.4 mg under the tongue every 5 (five) minutes as needed for chest pain.   NON FORMULARY Take 3,000 mg by mouth 2 (two) times daily. [place]   oxyCODONE (OXY IR/ROXICODONE) 5 MG immediate release tablet Take 1 tablet (5 mg total) by mouth every 3 (three) hours as needed (pain).   pioglitazone (ACTOS) 45 MG tablet Take 45 mg by mouth daily.   rosuvastatin (CRESTOR) 10 MG tablet Take 10 mg by mouth daily at 12 noon.   tamsulosin (FLOMAX) 0.4 MG CAPS capsule Take 0.4 mg by mouth daily.            Objective:     Wt Readings from Last 3 Encounters:  12/27/22 (!) 321 lb 3.2 oz (145.7 kg)  08/24/22 (!) 315 lb 0.6 oz (142.9 kg)  08/20/22 (!) 315 lb 0.6 oz (142.9 kg)      Vital signs reviewed  12/27/2022  - Note at rest 02 sats  93% on RA   General appearance:   morbidly obese (by BMI) amb wm/ freq throat clearly     HEENT : Oropharynx  clear      Nasal turbinates nl    NECK :  without  apparent JVD/ palpable Nodes/TM     LUNGS: no acc muscle use,  Nl contour chest which is clear to A and P bilaterally without cough on insp or exp maneuvers   CV:  RRR  no s3 or murmur or increase in P2, and  pitting edema R > L    ABD:  soft and nontender with nl inspiratory excursion in the supine position. No bruits or organomegaly appreciated  MS:  Nl gait/ ext warm without deformities Or obvious joint restrictions  calf tenderness, cyanosis or clubbing    SKIN: warm and dry without lesions    NEURO:  alert, approp, nl sensorium with  no motor or cerebellar deficits apparent.            I personally reviewed images and agree with radiology impression as follows:   Chest CT 12/04/22     ADDENDUM: Outside CT chest 10/06/2022 is available for comparison. Marked improvement in multilobar consolidation, worst in the left upper lobe, as well as in extensive peribronchovascular nodularity, without complete resolution  >>> rec f/u 3 m = 02/2723     Assessment

## 2022-12-27 NOTE — Addendum Note (Signed)
Addended by: Priscille Kluver on: 12/27/2022 04:24 PM   Modules accepted: Orders

## 2022-12-27 NOTE — Assessment & Plan Note (Addendum)
Active smoker Onset around 20-30s - PFTs 2015 by [facility] no obstruction but pos response to trelegy  11/07/2021  After extensive coaching inhaler device,  effectiveness =    75% try breztri x one week - 11/07/2021   Walked RA  x  3  lap(s) =  approx 450  ft  @ mod fast pace, stopped due to end of study, min sob  with lowest 02 sats 92%  - 12/27/2022   Walked on RA  x  3  lap(s) =  approx 450  ft  @ mod pace, stopped due to back pain and sob  with lowest 02 sats 91%    AB component adequately addressed but may do just as well on lower ICS content device based on severity of recent pna - would prefer symb80 2bid but can't get that on assistance program so will address at next ov  Discussed in detail all the  indications, usual  risks and alternatives  relative to the benefits with patient who agrees to proceed with Rx as outlined.

## 2022-12-27 NOTE — Patient Instructions (Addendum)
Make sure you check your oxygen saturation at your highest level of activity(NOT after you stop)  to be sure it stays over 90% and keep track of it at least once a week, more often if breathing getting worse, and let me know if losing ground. (Collect the dots to connect the dots approach)     Also  Ok to try albuterol x 2 puffs x 15 min before an activity (on alternating days)  that you know would usually make you short of breath and see if it makes any difference and if makes none then don't take albuterol after activity unless you can't catch your breath as this means it's the resting that helps, not the albuterol.      Please remember to go to the lab department   for your tests - we will call you with the results when they are available.      My office will be contacting you by phone for referral to CT chest around Mar 04 2023   - if you don't hear back from my office within one week please call us back or notify us thru MyChart and we'll address it right away.    Please schedule a follow up visit in 3 months but call sooner if needed

## 2022-12-28 ENCOUNTER — Telehealth: Payer: Self-pay | Admitting: Internal Medicine

## 2022-12-28 DIAGNOSIS — R7989 Other specified abnormal findings of blood chemistry: Secondary | ICD-10-CM

## 2022-12-28 LAB — BASIC METABOLIC PANEL
BUN/Creatinine Ratio: 12 (ref 10–24)
BUN: 14 mg/dL (ref 8–27)
CO2: 25 mmol/L (ref 20–29)
Calcium: 10 mg/dL (ref 8.6–10.2)
Chloride: 101 mmol/L (ref 96–106)
Creatinine, Ser: 1.14 mg/dL (ref 0.76–1.27)
Glucose: 122 mg/dL — ABNORMAL HIGH (ref 70–99)
Potassium: 4.5 mmol/L (ref 3.5–5.2)
Sodium: 141 mmol/L (ref 134–144)
eGFR: 71 mL/min/{1.73_m2} (ref >59)

## 2022-12-28 LAB — HEPATIC FUNCTION PANEL
ALT: 14 IU/L (ref 0–44)
AST: 18 IU/L (ref 0–40)
Albumin: 4.1 g/dL (ref 3.9–4.9)
Alkaline Phosphatase: 69 IU/L (ref 44–121)
Bilirubin Total: 0.4 mg/dL (ref 0.0–1.2)
Bilirubin, Direct: 0.14 mg/dL (ref 0.00–0.40)
Total Protein: 6.7 g/dL (ref 6.0–8.5)

## 2022-12-28 LAB — D-DIMER, QUANTITATIVE: D-DIMER: 2.23 mg/L FEU — ABNORMAL HIGH (ref 0.00–0.49)

## 2022-12-28 LAB — SEDIMENTATION RATE: Sed Rate: 23 mm/hr (ref 0–30)

## 2022-12-28 LAB — BRAIN NATRIURETIC PEPTIDE: BNP: 40.1 pg/mL (ref 0.0–100.0)

## 2022-12-28 NOTE — Telephone Encounter (Signed)
Will need CTa chest due to elevated d dimer  this week

## 2022-12-31 ENCOUNTER — Ambulatory Visit: Payer: No Typology Code available for payment source | Admitting: Internal Medicine

## 2023-01-01 NOTE — Telephone Encounter (Signed)
Both orders pended.  Dr. Melvyn Novas will you review CTA order and sign/ adjust?  Flag for contrast allergy   ATC patient. LVM for patient with results and plan for venous dopplers and Cta. Advised to call back for questions.

## 2023-01-01 NOTE — Telephone Encounter (Signed)
Dr. Melvyn Novas please advise, the phone encounter says to order a Cta and results note says following:   Bryan Rockers, MD  Fritzi Mandes D, CMA Call patient :  Studies are unremarkable x for elevated d dimer so needs bilateral venous dopplers next  Which would you like [place] to order? Or if you want both ordered, in what order do they need to be done?

## 2023-01-01 NOTE — Telephone Encounter (Signed)
Order both, thanks

## 2023-01-01 NOTE — Telephone Encounter (Signed)
Change CTa to V/q

## 2023-01-02 NOTE — Telephone Encounter (Signed)
Per Judeen Hammans [facility] She called patient to schedule doppler and patient refuses at this time.  Patient does not understand why he needs the tests and he wants DR. Wert to speak with Dr. Domenic Polite about the suggest tests first. If he did any tests he cannot do them until Monday of next week.

## 2023-01-03 NOTE — Telephone Encounter (Signed)
Let pt know dr Domenic Polite has reviewed and agreed with w/u and if pt still declines the studies he can discuss with his pcp or Dr Domenic Polite next week > if meantime if condition worsens go to er

## 2023-01-03 NOTE — Telephone Encounter (Signed)
He has a pos d dimer and chronic leg swelling with iv contrast allergy so needs v/q and venous dopplers both legs but refuses to do so until he gets in touch with Dr Domenic Polite so let's send this Phone note to Dr McDowell's office so he's aware

## 2023-01-04 NOTE — Telephone Encounter (Signed)
ATC pt. LVMTCB.  

## 2023-01-31 ENCOUNTER — Encounter: Payer: Self-pay | Admitting: Cardiology

## 2023-01-31 ENCOUNTER — Ambulatory Visit: Payer: No Typology Code available for payment source | Attending: Cardiology | Admitting: Cardiology

## 2023-01-31 VITALS — BP 130/80 | HR 83 | Ht 67.0 in | Wt 324.8 lb

## 2023-01-31 DIAGNOSIS — E782 Mixed hyperlipidemia: Secondary | ICD-10-CM | POA: Diagnosis not present

## 2023-01-31 DIAGNOSIS — I25118 Atherosclerotic heart disease of native coronary artery with other forms of angina pectoris: Secondary | ICD-10-CM | POA: Diagnosis not present

## 2023-01-31 DIAGNOSIS — I10 Essential (primary) hypertension: Secondary | ICD-10-CM

## 2023-01-31 MED ORDER — ROSUVASTATIN CALCIUM 5 MG PO TABS: 5.0000 mg | ORAL_TABLET | Freq: Every day | ORAL | 3 refills | Status: DC

## 2023-01-31 NOTE — Patient Instructions (Addendum)
Medication Instructions:    DECREASE Crestor to 5 mg daily at dinner    Labwork: Fasting lipids BEFORE next visit in 6 months  Testing/Procedures: Your physician has requested that you have an echocardiogram. Echocardiography is a painless test that uses sound waves to create images of your heart. It provides your doctor with information about the size and shape of your heart and how well your heart's chambers and valves are working. This procedure takes approximately one hour. There are no restrictions for this procedure. Please do NOT wear cologne, perfume, aftershave, or lotions (deodorant is allowed). Please arrive 15 minutes prior to your appointment time.  Follow-Up: 6 months  Any Other Special Instructions Will Be Listed Below (If Applicable).  If you need a refill on your cardiac medications before your next appointment, please call your pharmacy.

## 2023-01-31 NOTE — Progress Notes (Signed)
    Cardiology Office Note  Date: 01/31/2023   ID: Dominico Blair, DOB 1956/08/29, MRN [id number]  History of Present Illness: Bryan Blair is a 67 y.o. male last seen in June 2023 by Ms. Duke PA-C, I reviewed the note.  He is here for a routine visit.  He does not describe any active angina, no change in dyspnea on exertion.  He has had some weight gain and more consistent leg swelling, not using his Lasix on a regular basis.  He did undergo cervical spine surgery in July of last year, no obvious perioperative cardiac events.  He tells me that he was hospitalized while in [place] back in December of last year with pneumonia, required ventilatory support.  It sounds like he had some degree of follow-up cardiac evaluation, although not necessarily evidence of ACS.  He is seeing Dr. Sherene Sires for pulmonary follow-up, has a 12 mm left upper lobe nodule.  I reviewed his recent lab work including lipids.  His LDL is 59.  He asked about cutting back or stopping statin therapy with concerns about peripheral neuropathy.  Reportedly had trouble with Lipitor in the past.  Physical Exam: VS:  BP 130/80   Pulse 83   Ht  (1.702 m)   Wt (!) 324 lb 12.8 oz (147.3 kg)   SpO2 95%   BMI 50.87 kg/m , BMI Body mass index is 50.87 kg/m.  Wt Readings from Last 3 Encounters:  01/31/23 (!) 324 lb 12.8 oz (147.3 kg)  12/27/22 (!) 321 lb 3.2 oz (145.7 kg)  08/24/22 (!) 315 lb 0.6 oz (142.9 kg)    General: Patient appears comfortable at rest. HEENT: Conjunctiva and lids normal. Neck: Supple, no elevated JVP or carotid bruits. Lungs: Decreased breath sounds without wheezing, nonlabored at rest. Cardiac: Regular rate and rhythm, no S3 or significant systolic murmur. Extremities: Lower leg edema.  ECG:  An ECG dated 03/02/2022 was personally reviewed today and demonstrated:  Sinus rhythm.  Labwork: 04/20/2022: Hemoglobin 14.6; Platelets 222 12/27/2022: ALT 14; AST 18; BNP 40.1; BUN 14; Creatinine, Ser  1.14; Potassium 4.5; Sodium 141  February 2024: Cholesterol 134, triglycerides 105, HDL 56, LDL 59  Other Studies Reviewed Today:  No interval cardiac testing for review today.  Assessment and Plan:  1.  CAD status post LAD angioplasty in 1995, circumflex angioplasty 1996, and stent intervention to the circumflex in 2000.  Last cardiac catheterization in 2019 revealed patent circumflex stent site and otherwise mild atherosclerosis.  He does not describe any active angina at this time with plan to continue medical therapy.  Due to weight gain and leg swelling we will update echocardiogram to ensure no change in LVEF.  Currently on aspirin, Imdur, Cozaar, Lasix, Crestor and as needed nitroglycerin.  2.  Mixed hyperlipidemia.  LDL 59 in February.  As per above discussion we will cut Crestor back to 5 mg daily to see if he notices any improvement in neuropathy symptoms.  Recheck FLP in 6 months.  3.  Essential hypertension.  No change to current regimen.  4.  Tobacco abuse.  We discussed cessation, he states that he has cut back significantly.  Disposition:  Follow up  6 months.  Signed, Jonelle Sidle, M.D., F.A.C.C. [facility] at [facility]

## 2023-02-11 ENCOUNTER — Telehealth: Payer: Self-pay | Admitting: Internal Medicine

## 2023-02-13 NOTE — Telephone Encounter (Signed)
ATC pt LVM letting him know I re faxed his [employer] pt assistance forms.

## 2023-02-15 ENCOUNTER — Telehealth: Payer: Self-pay | Admitting: Cardiology

## 2023-02-15 NOTE — Telephone Encounter (Signed)
Pt c/o medication issue:  1. Name of Medication: furosemide (LASIX) 20 MG tablet   2. How are you currently taking this medication (dosage and times per day)?   Take 1 tablet (20 mg total) by mouth daily as needed for edema.    3. Are you having a reaction (difficulty breathing--STAT)? 90  4. What is your medication issue? Patient states that the dr knows that he is taking 3 pills a day but the prescription says 1. A new prescription needs to be sent to the [facility]. Please advise

## 2023-02-15 NOTE — Telephone Encounter (Signed)
Patient seen in office on 01/31/23 and was noted he was not taking lasix on a regular basis.   Now , according to his call, he is taking 60 mg daily.   I will need to verify this with Dr.McDowell before refilling rx.

## 2023-02-18 MED ORDER — FUROSEMIDE 20 MG PO TABS: 60.0000 mg | ORAL_TABLET | Freq: Every day | ORAL | 1 refills | Status: DC

## 2023-02-18 NOTE — Telephone Encounter (Signed)
I spoke with patient and he states he has been taking 60 mg daily for a long time. I am not sure why his medication list said 20 mg prn.He has had a recent bmet on 12/27/22 and BUN/Creat were in normal range.   I will change rx to reflect this and refill medication.

## 2023-02-27 ENCOUNTER — Ambulatory Visit (HOSPITAL_COMMUNITY)
Admission: RE | Admit: 2023-02-27 | Discharge: 2023-02-27 | Disposition: A | Discharge status: Home / Self Care | Payer: No Typology Code available for payment source | Source: Ambulatory Visit | Attending: Cardiology | Admitting: Cardiology

## 2023-02-27 DIAGNOSIS — I25118 Atherosclerotic heart disease of native coronary artery with other forms of angina pectoris: Secondary | ICD-10-CM | POA: Diagnosis not present

## 2023-02-27 DIAGNOSIS — I255 Ischemic cardiomyopathy: Secondary | ICD-10-CM | POA: Diagnosis not present

## 2023-02-27 LAB — ECHOCARDIOGRAM COMPLETE
Area-P 1/2: 3.08 cm2
S' Lateral: 3.1 cm

## 2023-02-27 MED ORDER — PERFLUTREN LIPID MICROSPHERE
1.0000 mL | INTRAVENOUS | Status: AC | PRN
  Administered 2023-02-27: 4 mL via INTRAVENOUS

## 2023-02-27 NOTE — Progress Notes (Signed)
*  PRELIMINARY RESULTS* Echocardiogram 2D Echocardiogram has been performed with Definity.  Stacey Drain 02/27/2023, 4:10 PM

## 2023-03-05 ENCOUNTER — Ambulatory Visit (HOSPITAL_COMMUNITY)
Admission: RE | Admit: 2023-03-05 | Discharge: 2023-03-05 | Disposition: A | Discharge status: Home / Self Care | Payer: No Typology Code available for payment source | Source: Ambulatory Visit | Attending: Internal Medicine | Admitting: Internal Medicine

## 2023-03-05 DIAGNOSIS — R918 Other nonspecific abnormal finding of lung field: Secondary | ICD-10-CM | POA: Insufficient documentation

## 2023-03-05 DIAGNOSIS — R911 Solitary pulmonary nodule: Secondary | ICD-10-CM | POA: Diagnosis not present

## 2023-03-05 DIAGNOSIS — J479 Bronchiectasis, uncomplicated: Secondary | ICD-10-CM | POA: Diagnosis not present

## 2023-06-02 IMAGING — CT CT HEAD W/O CM
4 series · 16 of 47 positions shown, 18 images · non-contrast
Comparison: None Available.

CLINICAL DATA: Neuro deficit, stroke suspected



[Series 3: head without · axial · non-contrast · 0.44mm/px · z∈[-73,+47]mm · 7 of 34 slices shown, 9 images]
[im 5/34  brain]
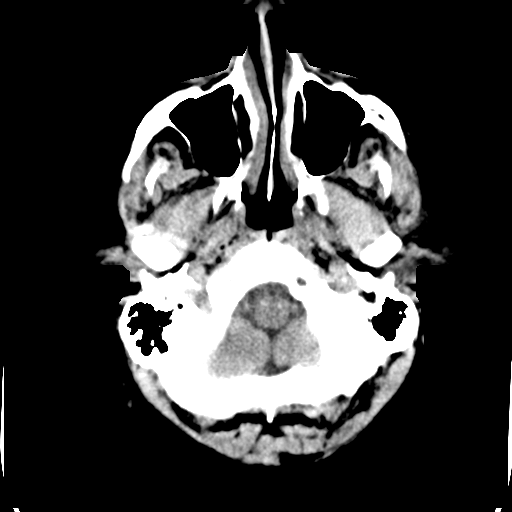
[im 5/34  bone]
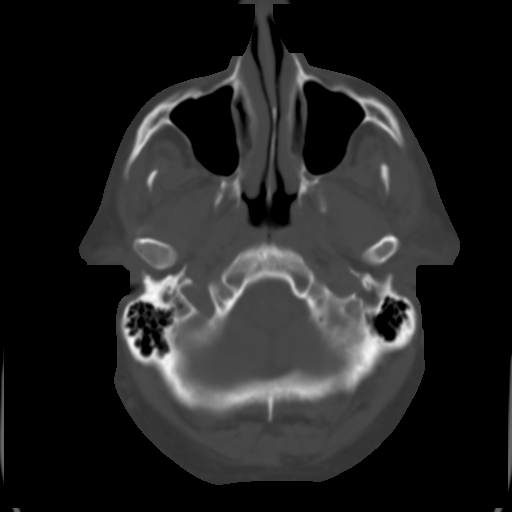
[im 9/34  brain]
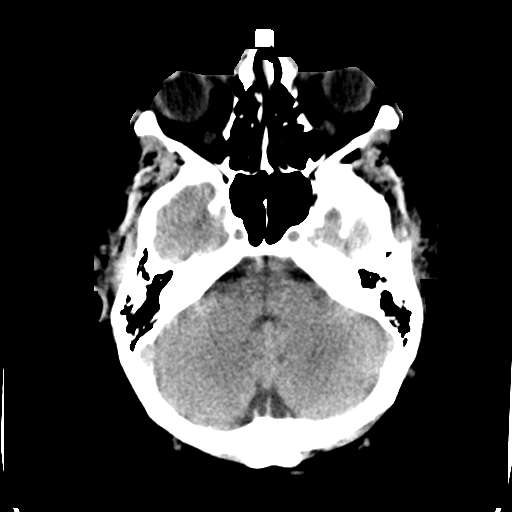
[im 13/34  brain]
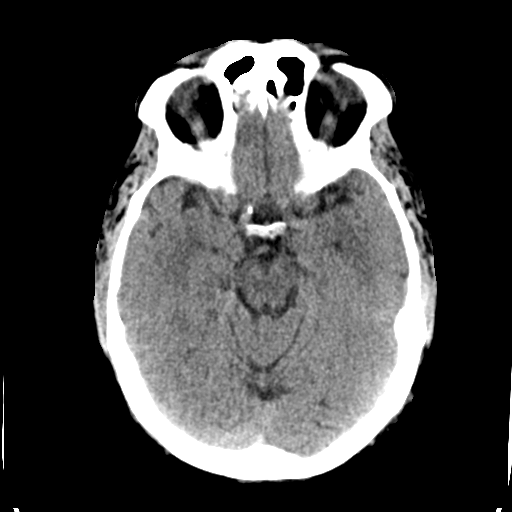
[im 17/34  brain]
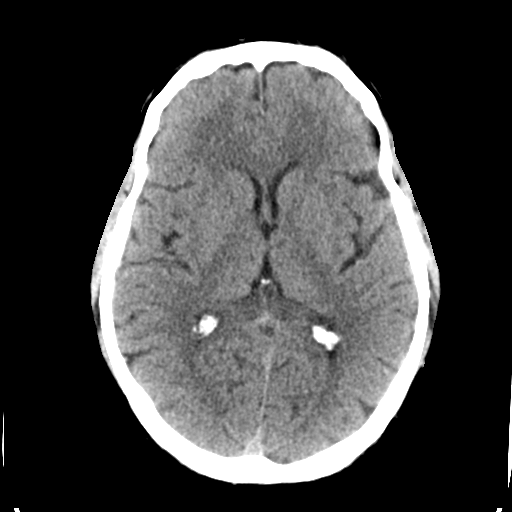
[im 21/34  brain]
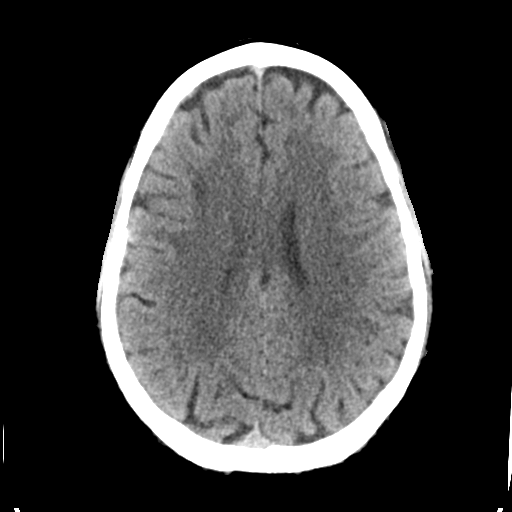
[im 21/34  bone]
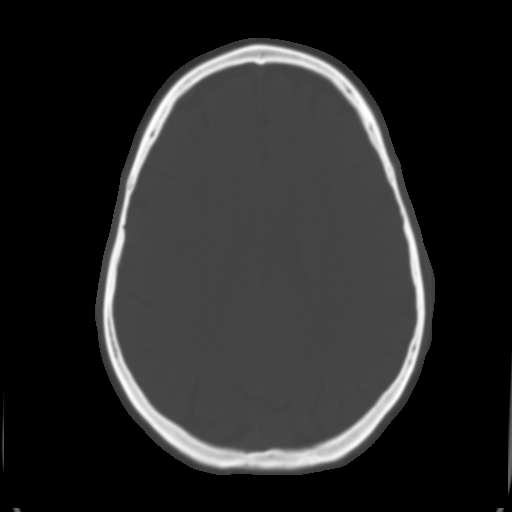
[im 25/34  brain]
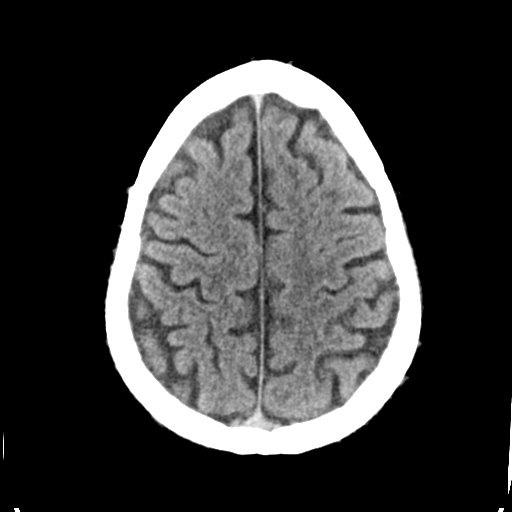
[im 29/34  brain]
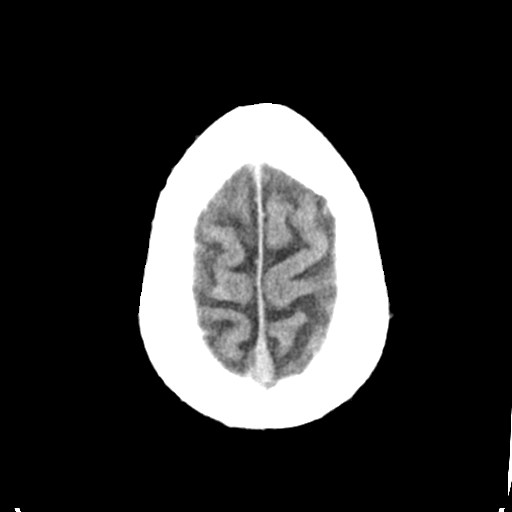

[Series 4: head bone · axial · 0.44mm/px · z∈[-77,-43]mm · 3 of 85 slices shown]
[im 9/85  bone]
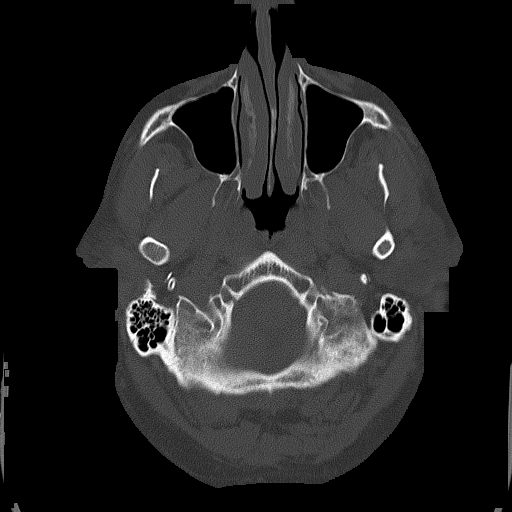
[im 17/85  bone]
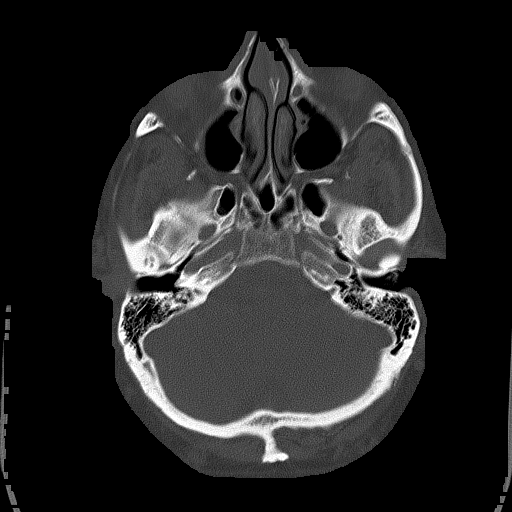
[im 26/85  bone]
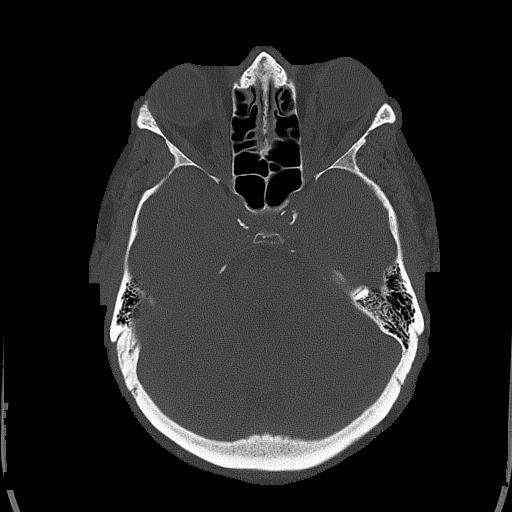

[Series 5: head without cor · coronal · non-contrast · 0.33mm/px · 3 of 72 slices shown]
[im 24/72  brain]
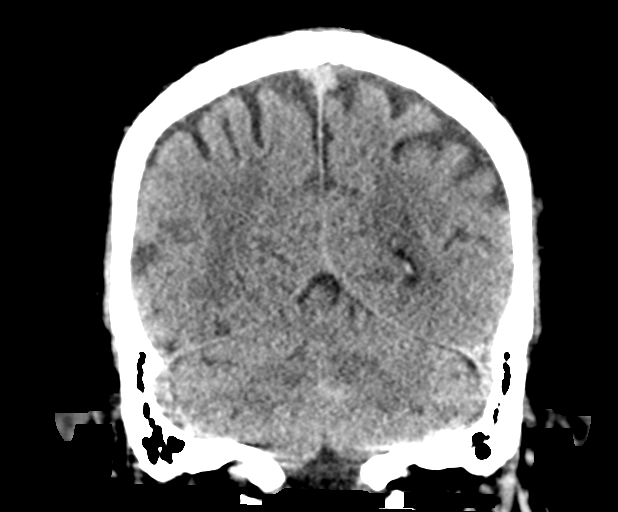
[im 32/72  brain]
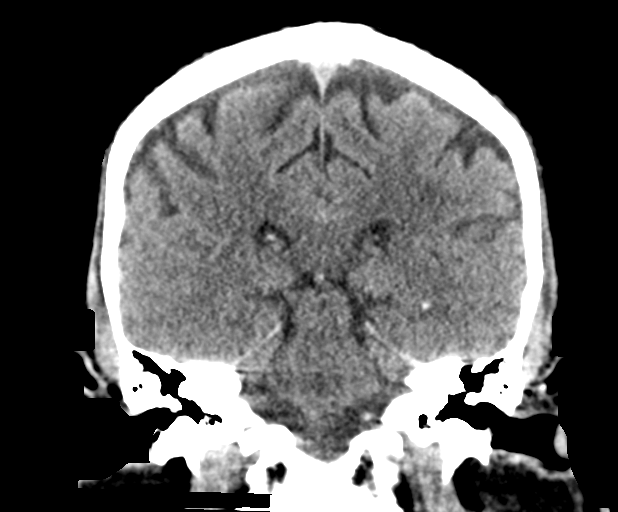
[im 40/72  brain]
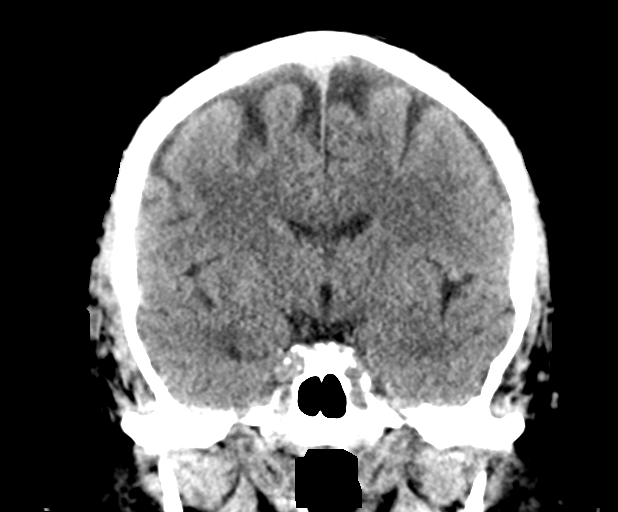

[Series 6: head without sag · sagittal · non-contrast · 0.33mm/px · 3 of 69 slices shown]
[im 23/69  brain]
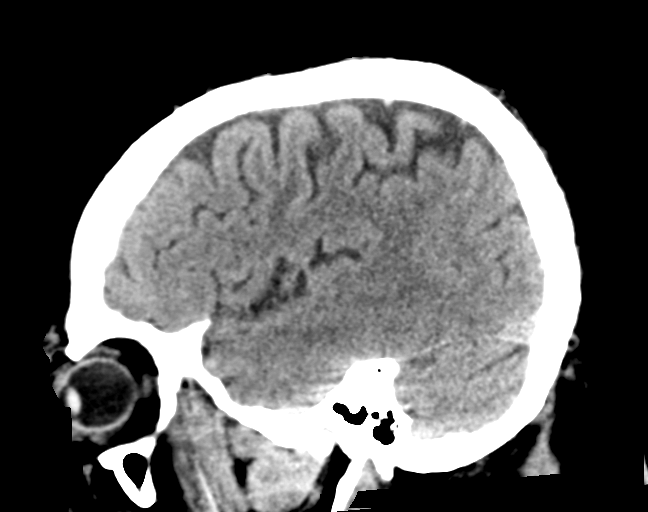
[im 35/69  brain]
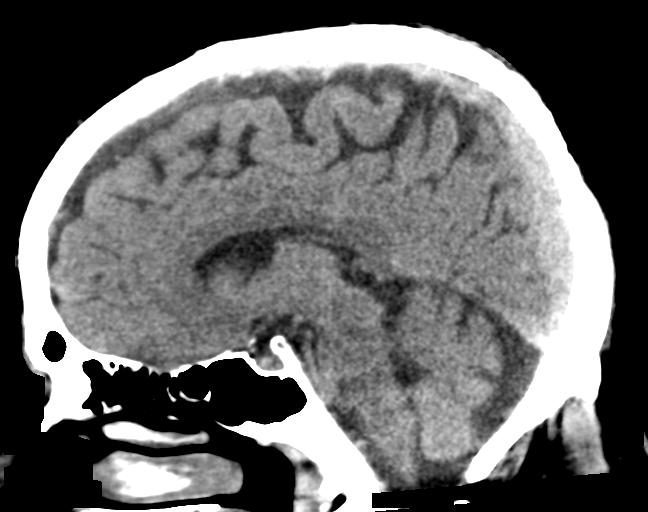
[im 46/69  brain]
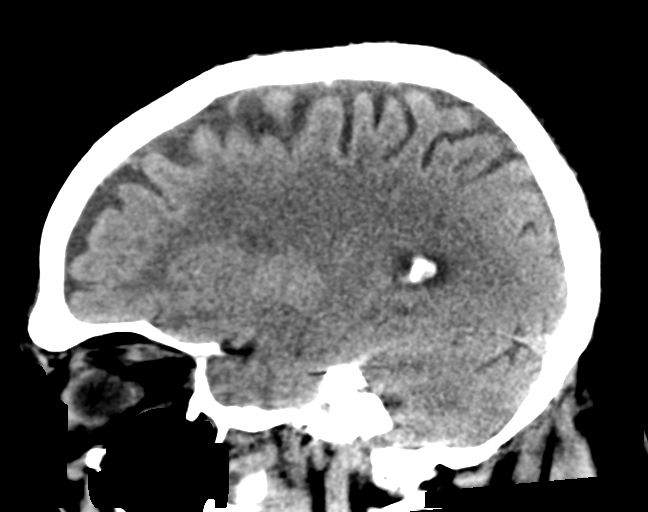

[16 of 47 positions shown; findings below may reference images not displayed]

FINDINGS: Brain: No evidence of acute infarction, hemorrhage, hydrocephalus,
extra-axial collection or mass lesion/mass effect.

Mild subcortical white matter and periventricular small vessel
ischemic changes.

Vascular: Intracranial atherosclerosis.

Skull: Normal. Negative for fracture or focal lesion.

Sinuses/Orbits: The visualized paranasal sinuses are essentially
clear. The mastoid air cells are unopacified.

Other: None.
IMPRESSION: No acute intracranial abnormality.

Mild small vessel ischemic changes.

## 2023-07-29 ENCOUNTER — Other Ambulatory Visit: Payer: Self-pay

## 2023-07-29 DIAGNOSIS — Z6841 Body Mass Index (BMI) 40.0 and over, adult: Secondary | ICD-10-CM | POA: Diagnosis not present

## 2023-07-29 DIAGNOSIS — R059 Cough, unspecified: Secondary | ICD-10-CM | POA: Diagnosis not present

## 2023-07-29 DIAGNOSIS — J019 Acute sinusitis, unspecified: Secondary | ICD-10-CM | POA: Diagnosis not present

## 2023-07-29 DIAGNOSIS — R030 Elevated blood-pressure reading, without diagnosis of hypertension: Secondary | ICD-10-CM | POA: Diagnosis not present

## 2023-07-29 MED ORDER — FUROSEMIDE 20 MG PO TABS: 60.0000 mg | ORAL_TABLET | Freq: Every day | ORAL | 0 refills | Status: DC

## 2023-08-06 DIAGNOSIS — R1310 Dysphagia, unspecified: Secondary | ICD-10-CM | POA: Diagnosis not present

## 2023-08-06 DIAGNOSIS — J029 Acute pharyngitis, unspecified: Secondary | ICD-10-CM | POA: Diagnosis not present

## 2023-08-06 DIAGNOSIS — R030 Elevated blood-pressure reading, without diagnosis of hypertension: Secondary | ICD-10-CM | POA: Diagnosis not present

## 2023-08-15 ENCOUNTER — Encounter (INDEPENDENT_AMBULATORY_CARE_PROVIDER_SITE_OTHER): Payer: Self-pay | Admitting: *Deleted

## 2023-08-16 DIAGNOSIS — J449 Chronic obstructive pulmonary disease, unspecified: Secondary | ICD-10-CM | POA: Diagnosis not present

## 2023-08-16 DIAGNOSIS — Z23 Encounter for immunization: Secondary | ICD-10-CM | POA: Diagnosis not present

## 2023-08-16 DIAGNOSIS — Z6841 Body Mass Index (BMI) 40.0 and over, adult: Secondary | ICD-10-CM | POA: Diagnosis not present

## 2023-08-16 DIAGNOSIS — I10 Essential (primary) hypertension: Secondary | ICD-10-CM | POA: Diagnosis not present

## 2023-08-16 DIAGNOSIS — E1165 Type 2 diabetes mellitus with hyperglycemia: Secondary | ICD-10-CM | POA: Diagnosis not present

## 2023-08-16 DIAGNOSIS — F419 Anxiety disorder, unspecified: Secondary | ICD-10-CM | POA: Diagnosis not present

## 2023-08-16 DIAGNOSIS — R918 Other nonspecific abnormal finding of lung field: Secondary | ICD-10-CM | POA: Diagnosis not present

## 2023-08-16 DIAGNOSIS — H548 Legal blindness, as defined in USA: Secondary | ICD-10-CM | POA: Diagnosis not present

## 2023-08-16 DIAGNOSIS — R1310 Dysphagia, unspecified: Secondary | ICD-10-CM | POA: Diagnosis not present

## 2023-08-16 DIAGNOSIS — I252 Old myocardial infarction: Secondary | ICD-10-CM | POA: Diagnosis not present

## 2023-08-16 DIAGNOSIS — M5093 Cervical disc disorder, unspecified, cervicothoracic region: Secondary | ICD-10-CM | POA: Diagnosis not present

## 2023-09-12 ENCOUNTER — Encounter (INDEPENDENT_AMBULATORY_CARE_PROVIDER_SITE_OTHER): Payer: Self-pay | Admitting: *Deleted

## 2023-09-12 ENCOUNTER — Ambulatory Visit (INDEPENDENT_AMBULATORY_CARE_PROVIDER_SITE_OTHER): Payer: No Typology Code available for payment source | Admitting: Gastroenterology

## 2023-10-22 ENCOUNTER — Ambulatory Visit (INDEPENDENT_AMBULATORY_CARE_PROVIDER_SITE_OTHER): Payer: No Typology Code available for payment source | Admitting: Gastroenterology

## 2023-10-29 ENCOUNTER — Ambulatory Visit (INDEPENDENT_AMBULATORY_CARE_PROVIDER_SITE_OTHER): Payer: No Typology Code available for payment source | Admitting: Gastroenterology

## 2023-10-30 ENCOUNTER — Telehealth: Payer: Self-pay | Admitting: *Deleted

## 2023-10-30 ENCOUNTER — Ambulatory Visit (INDEPENDENT_AMBULATORY_CARE_PROVIDER_SITE_OTHER): Payer: No Typology Code available for payment source | Admitting: Gastroenterology

## 2023-10-30 ENCOUNTER — Encounter (INDEPENDENT_AMBULATORY_CARE_PROVIDER_SITE_OTHER): Payer: Self-pay | Admitting: Gastroenterology

## 2023-10-30 VITALS — BP 104/66 | HR 82 | Temp 97.8°F | Ht 67.0 in

## 2023-10-30 DIAGNOSIS — R1319 Other dysphagia: Secondary | ICD-10-CM | POA: Insufficient documentation

## 2023-10-30 DIAGNOSIS — Z6841 Body Mass Index (BMI) 40.0 and over, adult: Secondary | ICD-10-CM

## 2023-10-30 DIAGNOSIS — Z860100 Personal history of colon polyps, unspecified: Secondary | ICD-10-CM | POA: Insufficient documentation

## 2023-10-30 DIAGNOSIS — E66813 Obesity, class 3: Secondary | ICD-10-CM

## 2023-10-30 DIAGNOSIS — J189 Pneumonia, unspecified organism: Secondary | ICD-10-CM | POA: Insufficient documentation

## 2023-10-30 DIAGNOSIS — R1310 Dysphagia, unspecified: Secondary | ICD-10-CM

## 2023-10-30 DIAGNOSIS — Z860101 Personal history of adenomatous and serrated colon polyps: Secondary | ICD-10-CM

## 2023-10-30 DIAGNOSIS — J690 Pneumonitis due to inhalation of food and vomit: Secondary | ICD-10-CM | POA: Insufficient documentation

## 2023-10-30 NOTE — Progress Notes (Signed)
Vista Lawman , M.D. Gastroenterology & Hepatology [facility] Gastroenterology [address] [place], [place] [address] Primary Care Physician: Dion Saucier, PA-C [address] [place] [address]  Chief Complaint:  worsening dysphagia and CRC screening with history of large colon polyp burden   History of Present Illness: Bryan Blair is a 68 y.o. male with CAD , HLD , HTN , recurrent pnemonia , GERD s/p 360 ( nissen ) wrap in 1980's , multiple cervical surgery s/p fusion  who presents for evaluation of worsening dysphagia and CRC screening with history of large colon polyp burden   Patient reports in 79) he had refractory GERD moderate she is Randon 360 daily denies any fundoplication twice.  After that he had multiple cervical surgeries with fusion as well as.  Patient reports his symptoms are worsening difficulty swallowing sometimes he would choke right away after eating, he feels as if he is aspirating food and going down the wrong path.  Patient also sometimes feel there is food in the middle of this chest which is a chronic problem  Patient reports last time he had colonoscopy was 5 years ago and was told to have colonoscopy every 6 months on his life as per the patient last GI found "100s" of colonic polyps  Last EGD:2015 Last Colonoscopy:5 years ago at [facility]  2015 I am able to see only the path  FRAGMENTS OF TUBULAR ADENOMA AND SERRATED POLYP WITH FEATURES OF SESSILE SERRATED ADENOMA. NO HIGH GRADE DYSPLASIA OR CARCINOMA IS SEEN  FHx: great uncle who was diagnosed with colon cancer.  Social: active smoker  Surgical:surgical history significant for 2 previous hiatal hernia repairs and ventral hernia repair. He also has a history of 2 MIs, requiring stent placement   Labs with hemoglobin 12.4 platelet 250 normal liver enzymes TSH 0.7 hep C negative A1c 7.2 Past Medical History: Past Medical History:  Diagnosis Date   Anxiety    Arthritis     BPH (benign prostatic hyperplasia)    Complication of anesthesia    difficult to get patient to sleep   COPD (chronic obstructive pulmonary disease) (HCC)    Coronary artery disease    Angioplasty LAD 1995, angioplasty circumflex 1996, stent intervention circumflex 2000   Dysrhythmia    PVC's   Essential hypertension    H/O unstable angina    Headache    Hyperlipidemia    Legally blind    Neuromuscular disorder (HCC)    neuropathy   Neuropathy    Pneumonia    PVC (premature ventricular contraction)    Sleep apnea    no cpap   STEMI (ST elevation myocardial infarction) (HCC) 2000   Type 2 diabetes mellitus (HCC)     Past Surgical History: Past Surgical History:  Procedure Laterality Date   ANTERIOR CERVICAL DECOMP/DISCECTOMY FUSION N/A 04/23/2022   Procedure: Anterior Cervical Decompression Fusion - Cervical three-Cervical four - Cervical four-Cervical five - Cervical five-Cervical six;  Surgeon: Donalee Citrin, MD;  Location: [facility] OR;  Service: Neurosurgery;  Laterality: N/A;   BACK SURGERY     CARPAL TUNNEL RELEASE Left    CATARACT EXTRACTION W/PHACO Right 03/19/2022   Procedure: CATARACT EXTRACTION PHACO AND INTRAOCULAR LENS PLACEMENT (IOC);  Surgeon: [name], MD;  Location: [facility] ORS;  Service: Ophthalmology;  Laterality: Right;  CDE: 35.65   CATARACT EXTRACTION W/PHACO Left 08/24/2022   Procedure: CATARACT EXTRACTION PHACO AND INTRAOCULAR LENS PLACEMENT (IOC);  Surgeon: [name], MD;  Location: [facility]  ORS;  Service: Ophthalmology;  Laterality: Left;  CDE: 8.16   HAND SURGERY     HERNIA REPAIR     JOINT REPLACEMENT     TONSILLECTOMY     ULNAR NERVE REPAIR      Family History: Family History  Problem Relation Age of Onset   Heart disease Mother    Heart disease Father    Stroke Father     Social History: Social History   Tobacco Use  Smoking Status Every Day   Types: Cigarettes  Smokeless Tobacco Former   Social History   Substance and Sexual Activity   Alcohol Use No   Social History   Substance and Sexual Activity  Drug Use No    Allergies: Allergies  Allergen Reactions   Iodine Anaphylaxis   Shellfish Allergy Anaphylaxis   Sodium Hyaluronate (Avian) Swelling    r   Compazine [Prochlorperazine] Other (See Comments)    Makes hyperactive   Iohexol Hives    Needs 13 hour pre meds, did take benadryl on 12/04/2022 without any issues.    Benadryl [Diphenhydramine] Anxiety   Venomil Honey Bee Venom [Honey Bee Venom] Other (See Comments)    Causes blood poisoning     Medications: Current Outpatient Medications  Medication Sig Dispense Refill   acetaminophen (TYLENOL) 325 MG tablet Take 650 mg by mouth 2 (two) times daily.     albuterol (VENTOLIN HFA) 108 (90 Base) MCG/ACT inhaler Inhale 1-2 puffs into the lungs every 6 (six) hours as needed for wheezing or shortness of breath.     ALPRAZolam (XANAX) 0.5 MG tablet Take 0.5 mg by mouth 3 (three) times daily as needed for anxiety.     amoxicillin (AMOXIL) 500 MG capsule Take 500 mg by mouth 2 (two) times daily.     aspirin 81 MG tablet Take 81 mg by mouth. Takes 2 tablets daily     famotidine (PEPCID) 20 MG tablet Take 20 mg by mouth daily as needed for heartburn.     furosemide (LASIX) 20 MG tablet Take 3 tablets (60 mg total) by mouth daily. 270 tablet 0   glipiZIDE (GLUCOTROL XL) 10 MG 24 hr tablet Take 10 mg by mouth 2 (two) times daily.     ibuprofen (ADVIL) 200 MG tablet Take 200 mg by mouth every 8 (eight) hours as needed for mild pain.     isosorbide mononitrate (IMDUR) 30 MG 24 hr tablet Take 30 mg by mouth daily.     losartan (COZAAR) 25 MG tablet Take 25 mg by mouth daily.     Multiple Vitamin (MULTIVITAMIN WITH MINERALS) TABS tablet Take 1 tablet by mouth daily.     nitroGLYCERIN (NITROSTAT) 0.4 MG SL tablet Place 0.4 mg under the tongue every 5 (five) minutes as needed for chest pain.     NON FORMULARY Take 3,000 mg by mouth 2 (two) times daily. [place]     OVER THE  COUNTER MEDICATION Co q10 daily     OVER THE COUNTER MEDICATION Elderberry daily     pioglitazone (ACTOS) 45 MG tablet Take 45 mg by mouth daily.     rosuvastatin (CRESTOR) 5 MG tablet Take 1 tablet (5 mg total) by mouth at bedtime. 90 tablet 3   tamsulosin (FLOMAX) 0.4 MG CAPS capsule Take 0.4 mg by mouth daily.     No current facility-administered medications for this visit.    Review of Systems: GENERAL: negative for malaise, night sweats HEENT: No changes in hearing or vision, no nose  bleeds or other nasal problems. NECK: Negative for lumps, goiter, pain and significant neck swelling RESPIRATORY: Negative for cough, wheezing CARDIOVASCULAR: Negative for chest pain, leg swelling, palpitations, orthopnea GI: SEE HPI MUSCULOSKELETAL: Negative for joint pain or swelling, back pain, and muscle pain. SKIN: Negative for lesions, rash HEMATOLOGY Negative for prolonged bleeding, bruising easily, and swollen nodes. ENDOCRINE: Negative for cold or heat intolerance, polyuria, polydipsia and goiter. NEURO: negative for tremor, gait imbalance, syncope and seizures. The remainder of the review of systems is noncontributory.   Physical Exam: BP 104/66   Pulse 82   Temp 97.8 F (36.6 C)   Ht 5\' 7"  (1.702 m)   BMI 50.87 kg/m  GENERAL: The patient is AO x3, in no acute distress. HEENT: Head is normocephalic and atraumatic. EOMI are intact. Mouth is well hydrated and without lesions. NECK: Supple. No masses LUNGS: Clear to auscultation. No presence of rhonchi/wheezing/rales. Adequate chest expansion HEART: RRR, normal s1 and s2. ABDOMEN: Soft, nontender, no guarding, no peritoneal signs, and nondistended. BS +. No masses.  Imaging/Labs: as above     Latest Ref Rng & Units 04/20/2022    8:06 AM 03/02/2022    6:15 PM 09/13/2021    7:52 AM  CBC  WBC 4.0 - 10.5 K/uL 10.7  8.6  7.1   Hemoglobin 13.0 - 17.0 g/dL 52.8  41.3  24.4   Hematocrit 39.0 - 52.0 % 44.0  42.5  40.8   Platelets 150 -  400 K/uL 222  216  223    No results found for: "IRON", "TIBC", "FERRITIN"  I personally reviewed and interpreted the available labs, imaging and endoscopic files.  Impression and Plan:  Bryan Blair is a 68 y.o. male with CAD , HLD , HTN , recurrent pnemonia , GERD s/p 360 ( nissen ) wrap in 1980's , multiple cervical surgery s/p fusion  who presents for evaluation of worsening dysphagia and CRC screening with history of large colon polyp burden   #Chronic Dysphagia   This could be motility disorder with EGJ outflow obstruction given history of Nissen fundoplication performed twice in 1980s.  Patient had significant neck surgery and could be mechanical obstruction.  Given recurrent pneumonia and patient feeling as if she is aspirating could very well be oropharyngeal dysphagia as well  Given complexity of the case would obtain modified barium swallow and esophagogram to evaluate for oropharyngeal dysphagia/aspiration and also to evaluate for any mechanical extrinsic compression  Will proceed with upper endoscopy with esophageal biopsies plus and minus dilation  #History of colon polyps  Patient appears to have multiple colonoscopies although unable to see the actual report per last colonoscopy in our system from 2015 with SSA and TA.  As per the patient he was found to have "100's" of colonic polyp 5 years ago and was supposed to repeat every 6 months  He is well due for colonoscopy after risk-benefit limitation discussion patient is agreeing for colonoscopy  #Class III Obesity    Advised patient on smoking cessation     - walking at a brisk pace/biking at moderate intesity 2.5-5 hours per week     - use pedometer/step counter to track activity     - goal to lose 5-10% of initial body weight     - avoid suagry drinks and juices, use zero calorie beverages     - increase water intake     - eat a low carb diet with plenty of veggies and fruit     -  Get sufficient sleep 7-8 hrs  nightly     - maitain active lifestyle     - avoid alcohol     - recommend 2-3 cups Coffee daily     - Counsel on lowering cholesterol by having a diet rich in vegetables,          protein (avoid red meats) and good fats(fish, salmon).  All questions were answered.      Vista Lawman, MD Gastroenterology and Hepatology [facility] Gastroenterology   This chart has been completed using [facility] Dictation software, and while attempts have been made to ensure accuracy , certain words and phrases may not be transcribed as intended

## 2023-10-30 NOTE — Telephone Encounter (Signed)
Left message to call back to schedule tele pre op appt.  

## 2023-10-30 NOTE — Telephone Encounter (Signed)
    10/30/23  Bryan Blair 1955-10-21  What type of surgery is being performed? COLONOSCOPY/EGD  When is surgery scheduled? TBD  What type of clearance is required (medical or pharmacy to hold medication or both? Medical  Name of physician performing surgery?  Dr. Starleen Arms Gastroenterology at [facility] Phone: [phone] Fax: [phone]  Anethesia type (none, local, MAC, general)? MAC

## 2023-10-30 NOTE — Patient Instructions (Signed)
It was very nice to meet you today, as dicussed with will plan for the following :  1) modified barium swallow 2) Esophagram 3) EGD plus Colonoscopy 4)obtain records from previous GI

## 2023-10-30 NOTE — Telephone Encounter (Signed)
I called the pt and there was no answer- LMTCB. ?

## 2023-10-30 NOTE — Telephone Encounter (Signed)
Requesting Pulmonary Clearance for procedures  What type of surgery is being performed? COLONOSCOPY/EGD  When is surgery scheduled? TBD  Name of physician performing surgery?  Dr. Starleen Arms Gastroenterology at [facility] Phone: [phone] Fax: [phone]  Anethesia type (none, local, MAC, general)? MAC

## 2023-10-30 NOTE — Telephone Encounter (Signed)
Over 6 m so needs ov 1st available

## 2023-10-30 NOTE — Telephone Encounter (Signed)
   Name: Bryan Blair  DOB: 1956-04-05  MRN: [id number]  Primary Cardiologist: Nona Dell, MD   Preoperative team, please contact this patient and set up a phone call appointment for further preoperative risk assessment. Please obtain consent and complete medication review. Thank you for your help.  I confirm that guidance regarding antiplatelet and oral anticoagulation therapy has been completed and, if necessary, noted below.  There is no request to hold aspirin.   I also confirmed the patient resides in the state of [place]. As per [facility] Medical Board telemedicine laws, the patient must reside in the state in which the provider is licensed.   Napoleon Form, Leodis Rains, NP 10/30/2023, 11:08 AM [facility]

## 2023-10-30 NOTE — Telephone Encounter (Signed)
LMOVM to call back to give DG ESOPHAGUS appt details. 1/24, arrival 10:45am, npo 3 hrs prior.  Referral sent for modified barium swallow to speech.

## 2023-10-31 ENCOUNTER — Telehealth: Payer: Self-pay | Admitting: Cardiology

## 2023-10-31 ENCOUNTER — Telehealth: Payer: Self-pay | Admitting: *Deleted

## 2023-10-31 DIAGNOSIS — H26491 Other secondary cataract, right eye: Secondary | ICD-10-CM | POA: Diagnosis not present

## 2023-10-31 DIAGNOSIS — Z961 Presence of intraocular lens: Secondary | ICD-10-CM | POA: Diagnosis not present

## 2023-10-31 NOTE — Telephone Encounter (Signed)
Pt has been scheduled tele preop appt 11/14/23. Med rec and consent are done.      Patient Consent for Virtual Visit        Bryan Blair has provided verbal consent on 10/31/2023 for a virtual visit (video or telephone).   CONSENT FOR VIRTUAL VISIT FOR:  Bryan Blair  By participating in this virtual visit I agree to the following:  I hereby voluntarily request, consent and authorize [facility] and its employed or contracted physicians, physician assistants, nurse practitioners or other licensed health care professionals (the Practitioner), to provide me with telemedicine health care services (the "Services") as deemed necessary by the treating Practitioner. I acknowledge and consent to receive the Services by the Practitioner via telemedicine. I understand that the telemedicine visit will involve communicating with the Practitioner through live audiovisual communication technology and the disclosure of certain medical information by electronic transmission. I acknowledge that I have been given the opportunity to request an in-person assessment or other available alternative prior to the telemedicine visit and am voluntarily participating in the telemedicine visit.  I understand that I have the right to withhold or withdraw my consent to the use of telemedicine in the course of my care at any time, without affecting my right to future care or treatment, and that the Practitioner or I may terminate the telemedicine visit at any time. I understand that I have the right to inspect all information obtained and/or recorded in the course of the telemedicine visit and may receive copies of available information for a reasonable fee.  I understand that some of the potential risks of receiving the Services via telemedicine include:  Delay or interruption in medical evaluation due to technological equipment failure or disruption; Information transmitted may not be sufficient (e.g. poor  resolution of images) to allow for appropriate medical decision making by the Practitioner; and/or  In rare instances, security protocols could fail, causing a breach of personal health information.  Furthermore, I acknowledge that it is my responsibility to provide information about my medical history, conditions and care that is complete and accurate to the best of my ability. I acknowledge that Practitioner's advice, recommendations, and/or decision may be based on factors not within their control, such as incomplete or inaccurate data provided by me or distortions of diagnostic images or specimens that may result from electronic transmissions. I understand that the practice of medicine is not an exact science and that Practitioner makes no warranties or guarantees regarding treatment outcomes. I acknowledge that a copy of this consent can be made available to me via my patient portal [facility] MyChart), or I can request a printed copy by calling the office of [facility].    I understand that my insurance will be billed for this visit.   I have read or had this consent read to me. I understand the contents of this consent, which adequately explains the benefits and risks of the Services being provided via telemedicine.  I have been provided ample opportunity to ask questions regarding this consent and the Services and have had my questions answered to my satisfaction. I give my informed consent for the services to be provided through the use of telemedicine in my medical care

## 2023-10-31 NOTE — Telephone Encounter (Signed)
Gill, Beverly6 minutes ago (11:06 AM)   BG Pt returning call to Pre-Op      Note   Legend, [place] [phone]  Andreas Blower

## 2023-10-31 NOTE — Telephone Encounter (Signed)
Pt has been scheduled tele preop appt 11/14/23. Med rec and consent are done.

## 2023-10-31 NOTE — Telephone Encounter (Signed)
Pt left vm returning call  LMTRC 

## 2023-10-31 NOTE — Telephone Encounter (Signed)
 Pt returning call to Pre-Op

## 2023-11-01 ENCOUNTER — Other Ambulatory Visit (HOSPITAL_COMMUNITY): Payer: No Typology Code available for payment source

## 2023-11-01 NOTE — Telephone Encounter (Signed)
Pt return call and left vm  [facility]

## 2023-11-04 ENCOUNTER — Telehealth (INDEPENDENT_AMBULATORY_CARE_PROVIDER_SITE_OTHER): Payer: Self-pay | Admitting: Gastroenterology

## 2023-11-04 MED ORDER — SUTAB 1479-225-188 MG PO TABS: ORAL_TABLET | ORAL | 0 refills | Status: DC

## 2023-11-04 NOTE — Telephone Encounter (Signed)
Hi I would advise him to have esophagram and modified barium swallow given choking spells to make sure he is not aspirating . If he is not able able to do them we can still proceed with EGD+ Colonoscopy as scheduled

## 2023-11-04 NOTE — Addendum Note (Signed)
Addended by: Marlowe Shores on: 11/04/2023 03:49 PM   Modules accepted: Orders

## 2023-11-04 NOTE — Telephone Encounter (Signed)
Contacted pt. Pt would like esophagram and barium swallow on the same day. Advised pt that they did call to schedule barium swallow but had to leave a message. Pt will call back once swallow test has been scheduled to see if we can get esophagram on same day.   Pt also states the he told the provider he didn't want anything to do with the gallon jug. Advised pt I apologize that I was unaware of that. Will send in tablets and mail instructions to pt once pre op is received. Pt has appt with cardiology and pulmonary 11/14/23 and 12/10/23

## 2023-11-04 NOTE — Telephone Encounter (Signed)
Pt left voicemail asking for a call back. Pt was not able to make esophagram appt last Friday. Pt is asking if he has to do the modified barium swallow. Pt states he would rather not if he doesn't have to. Pt has been scheduled for 12/12/23 for TCS EGD +/-ED. Please advise. Thank you.

## 2023-11-05 ENCOUNTER — Other Ambulatory Visit: Payer: Self-pay | Admitting: *Deleted

## 2023-11-05 MED ORDER — FUROSEMIDE 20 MG PO TABS: 60.0000 mg | ORAL_TABLET | Freq: Every day | ORAL | 0 refills | Status: AC

## 2023-11-06 ENCOUNTER — Telehealth (INDEPENDENT_AMBULATORY_CARE_PROVIDER_SITE_OTHER): Payer: Self-pay | Admitting: Gastroenterology

## 2023-11-06 ENCOUNTER — Other Ambulatory Visit (HOSPITAL_COMMUNITY): Payer: Self-pay | Admitting: Occupational Therapy

## 2023-11-06 ENCOUNTER — Encounter (INDEPENDENT_AMBULATORY_CARE_PROVIDER_SITE_OTHER): Payer: Self-pay

## 2023-11-06 DIAGNOSIS — R1312 Dysphagia, oropharyngeal phase: Secondary | ICD-10-CM

## 2023-11-06 NOTE — Telephone Encounter (Signed)
Received records of previous EGD/colonoscopy :

## 2023-11-06 NOTE — Telephone Encounter (Signed)
Pt left voicemail stating the barium swallow is scheduled for 3/3 at 11:30 and he wants to do esophagram on the same day. Contacted Central Sched[facility] and was able to schedule the esophagram for 11am on 12/09/23. Returned call to patient and advised pt of appt.

## 2023-11-14 ENCOUNTER — Telehealth: Payer: Self-pay | Admitting: Cardiology

## 2023-11-14 ENCOUNTER — Ambulatory Visit: Payer: No Typology Code available for payment source | Attending: Cardiovascular Disease | Admitting: Cardiology

## 2023-11-14 DIAGNOSIS — Z01810 Encounter for preprocedural cardiovascular examination: Secondary | ICD-10-CM | POA: Diagnosis not present

## 2023-11-14 NOTE — Telephone Encounter (Signed)
 Left message to return to discuss MD response.

## 2023-11-14 NOTE — Telephone Encounter (Signed)
 Seen April 2024, was to return Oct 2024, never made appointment. Now calls to say he does not want to make an appointment and wants to stop crestor , please advise.

## 2023-11-14 NOTE — Telephone Encounter (Signed)
 Called Bryan Blair in regards to setting up an appointment for a 6 month follow up. He states that he doesn't need to come for an appointment.  He wants to know if he can come off rosuvastatin  (CRESTOR ) 5 MG tablet .

## 2023-11-14 NOTE — Progress Notes (Signed)
 Virtual Visit via Telephone Note   Because of Bryan Blair co-morbid illnesses, he is at least at moderate risk for complications without adequate follow up.  This format is felt to be most appropriate for this patient at this time.  The patient did not have access to video technology/had technical difficulties with video requiring transitioning to audio format only (telephone).  All issues noted in this document were discussed and addressed.  No physical exam could be performed with this format.  Please refer to the patient's chart for his consent to telehealth for [facility].  Evaluation Performed:  Preoperative cardiovascular risk assessment _____________   Date:  11/14/2023   Patient ID:  Bryan Blair, DOB 02-20-1956, MRN [id number] Patient Location:  Home Provider location:   Office  Primary Care Provider:  Vida Mardy DEL, PA-C Primary Cardiologist:  Jayson Sierras, MD  Chief Complaint / Patient Profile   68 y.o. y/o male with a h/o CAD s/p PCI to LAD in 1995, PCI to LCx in 1996, stenting to LCx in 2000 with repeat angiography in 01/26/2015 and 11/27/2017 showing patent stents, he also has history of hyperlipidemia, hypertension, COPD, OSA and tobacco abuse.  He is pending colonoscopy/EGD with Dr. Cinderella and presents today for telephonic preoperative cardiovascular risk assessment.  History of Present Illness    Bryan Blair is a 68 y.o. male who presents via audio/video conferencing for a telehealth visit today.  Pt was last seen in cardiology clinic on 01/31/2023 by Dr. Sierras.  At that time Bryan Blair was doing well.  The patient is now pending procedure as outlined above. Since his last visit, he has remained stable from a cardiac standpoint.  Since his last visit he reports that he is doing very well, he notes some occasional lower extremity edema that is resolved with Lasix .  He denies any sublingual nitroglycerin  use. He is able to achieve greater  than 4 METS of activity without having any anginal symptoms.  He denies chest pain, new shortness of breath, palpitations, melena, hematuria, hemoptysis, diaphoresis, weakness, presyncope, syncope, orthopnea, and PND.   Past Medical History    Past Medical History:  Diagnosis Date   Anxiety    Arthritis    BPH (benign prostatic hyperplasia)    Complication of anesthesia    difficult to get patient to sleep   COPD (chronic obstructive pulmonary disease) (HCC)    Coronary artery disease    Angioplasty LAD 1995, angioplasty circumflex 1996, stent intervention circumflex 2000   Dysrhythmia    PVC's   Essential hypertension    H/O unstable angina    Headache    Hyperlipidemia    Legally blind    Neuromuscular disorder (HCC)    neuropathy   Neuropathy    Pneumonia    PVC (premature ventricular contraction)    Sleep apnea    no cpap   STEMI (ST elevation myocardial infarction) (HCC) 2000   Type 2 diabetes mellitus (HCC)    Past Surgical History:  Procedure Laterality Date   ANTERIOR CERVICAL DECOMP/DISCECTOMY FUSION N/A 04/23/2022   Procedure: Anterior Cervical Decompression Fusion - Cervical three-Cervical four - Cervical four-Cervical five - Cervical five-Cervical six;  Surgeon: Onetha Kuba, MD;  Location: [facility] OR;  Service: Neurosurgery;  Laterality: N/A;   BACK SURGERY     CARPAL TUNNEL RELEASE Left    CATARACT EXTRACTION W/PHACO Right 03/19/2022   Procedure: CATARACT EXTRACTION PHACO AND INTRAOCULAR LENS PLACEMENT (IOC);  Surgeon: Harrie Agent, MD;  Location: [facility] ORS;  Service: Ophthalmology;  Laterality: Right;  CDE: 35.65   CATARACT EXTRACTION W/PHACO Left 08/24/2022   Procedure: CATARACT EXTRACTION PHACO AND INTRAOCULAR LENS PLACEMENT (IOC);  Surgeon: Harrie Agent, MD;  Location: [facility] ORS;  Service: Ophthalmology;  Laterality: Left;  CDE: 8.16   HAND SURGERY     HERNIA REPAIR     JOINT REPLACEMENT     TONSILLECTOMY     ULNAR NERVE REPAIR      Allergies  Allergies   Allergen Reactions   Iodine  Anaphylaxis   Shellfish Allergy Anaphylaxis   Sodium Hyaluronate (Avian) Swelling    r   Compazine [Prochlorperazine] Other (See Comments)    Makes hyperactive   Iohexol  Hives    Needs 13 hour pre meds, did take benadryl  on 12/04/2022 without any issues.    Benadryl  [Diphenhydramine ] Anxiety   Venomil Honey Bee Venom [Honey Bee Venom] Other (See Comments)    Causes blood poisoning     Home Medications    Prior to Admission medications   Medication Sig Start Date End Date Taking? Authorizing Provider  acetaminophen  (TYLENOL ) 325 MG tablet Take 650 mg by mouth 2 (two) times daily.    [provider]  albuterol  (VENTOLIN  HFA) 108 (90 Base) MCG/ACT inhaler Inhale 1-2 puffs into the lungs every 6 (six) hours as needed for wheezing or shortness of breath. 07/04/20   [provider]  ALPRAZolam  (XANAX ) 0.5 MG tablet Take 0.5 mg by mouth 3 (three) times daily as needed for anxiety.    [provider]  amoxicillin  (AMOXIL ) 500 MG capsule Take 500 mg by mouth 2 (two) times daily.    [provider]  aspirin  81 MG tablet Take 81 mg by mouth. Takes 2 tablets daily    [provider]  famotidine  (PEPCID ) 20 MG tablet Take 20 mg by mouth daily as needed for heartburn.    [provider]  furosemide  (LASIX ) 20 MG tablet Take 3 tablets (60 mg total) by mouth daily. 11/05/23   Debera Jayson MATSU, MD  glipiZIDE  (GLUCOTROL  XL) 10 MG 24 hr tablet Take 10 mg by mouth 2 (two) times daily. 02/23/22   [provider]  ibuprofen (ADVIL) 200 MG tablet Take 200 mg by mouth every 8 (eight) hours as needed for mild pain.    [provider]  isosorbide  mononitrate (IMDUR ) 30 MG 24 hr tablet Take 30 mg by mouth daily.    [provider]  losartan  (COZAAR ) 25 MG tablet Take 25 mg by mouth daily. 10/19/20   [provider]  Multiple Vitamin (MULTIVITAMIN WITH MINERALS) TABS tablet Take 1 tablet by mouth  daily.    [provider]  nitroGLYCERIN  (NITROSTAT ) 0.4 MG SL tablet Place 0.4 mg under the tongue every 5 (five) minutes as needed for chest pain.    [provider]  NON FORMULARY Take 3,000 mg by mouth 2 (two) times daily. [place]    [provider]  OVER THE COUNTER MEDICATION Co q10 daily    [provider]  OVER THE COUNTER MEDICATION Elderberry daily    [provider]  pioglitazone  (ACTOS ) 45 MG tablet Take 45 mg by mouth daily. 07/09/18   [provider]  rosuvastatin  (CRESTOR ) 5 MG tablet Take 1 tablet (5 mg total) by mouth at bedtime. 01/31/23 01/31/24  Debera Jayson MATSU, MD  Sodium Sulfate-Mag Sulfate-KCl (SUTAB ) [phone] MG TABS As directed 11/04/23   Cinderella Deatrice FALCON, MD  tamsulosin  (FLOMAX ) 0.4 MG CAPS capsule Take 0.4 mg by  mouth daily.    [provider]   Physical Exam    Vital Signs:  Horice Carrero does not have vital signs available for review today. Given telephonic nature of communication, physical exam is limited. AAOx3. NAD. Normal affect.  Speech and respirations are unlabored. Accessory Clinical Findings  None Assessment & Plan    1.  Preoperative Cardiovascular Risk Assessment:  Colonoscopy/EGD with Dr. Cinderella  Mr. Ozawa's perioperative risk of a major cardiac event is 0.9% according to the Revised Cardiac Risk Index (RCRI).  Therefore, he is at low risk for perioperative complications.   His functional capacity is good at 6.05 METs according to the Duke Activity Status Index (DASI). Recommendations: According to ACC/AHA guidelines, no further cardiovascular testing needed.  The patient may proceed to surgery at acceptable risk.     The patient was advised that if he develops new symptoms prior to surgery to contact our office to arrange for a follow-up visit, and he verbalized understanding.  A copy of this note will be routed to requesting surgeon.  Time:   Today, I have spent 10 minutes  with the patient with telehealth technology discussing medical history, symptoms, and management plan.     Dorthy Hustead D Chadd Tollison, NP  11/14/2023, 9:35 AM

## 2023-11-15 ENCOUNTER — Encounter (INDEPENDENT_AMBULATORY_CARE_PROVIDER_SITE_OTHER): Payer: Self-pay

## 2023-11-18 NOTE — Telephone Encounter (Signed)
 Pt notified of Dr. Anselm Basset response and states that he does plan to follow up, just not at this time. He states that he has too many appts at this time.

## 2023-12-05 DIAGNOSIS — H26491 Other secondary cataract, right eye: Secondary | ICD-10-CM | POA: Diagnosis not present

## 2023-12-06 NOTE — Patient Instructions (Signed)
 Bryan Blair  12/06/2023     @PREFPERIOPPHARMACY @   Your procedure is scheduled on  12/12/2023.   Report to [facility] at  0800 A.M.   Call this number if you have problems the morning of surgery:  [phone]  If you experience any cold or flu symptoms such as cough, fever, chills, shortness of breath, etc. between now and your scheduled surgery, please notify us at the above number.   Remember:         Use your inhaler before you come and bring your rescue inhaler with you.   Follow the diet and prep instructions given to you by the office.    You may drink clear liquids until 0600 am on 12/12/2023.  Clear liquids allowed are:                    Water, Juice (No red color; non-citric and without pulp; diabetics please choose diet or no sugar options), Carbonated beverages (diabetics please choose diet or no sugar options), Clear Tea (No creamer, milk, or cream, including half & half and powdered creamer), Black Coffee Only (No creamer, milk or cream, including half & half and powdered creamer), and Clear Sports drink (No red color; diabetics please choose diet or no sugar options)    Take these medicines the morning of surgery with A SIP OF WATER             alprazolam(if needed), famotidine, isosorbide, tamsulosin.     Do not wear jewelry, make-up or nail polish, including gel polish,  artificial nails, or any other type of covering on natural nails (fingers and  toes).  Do not wear lotions, powders, or perfumes, or deodorant.  Do not shave 48 hours prior to surgery.  Men may shave face and neck.  Do not bring valuables to the hospital.  [facility] is not responsible for any belongings or valuables.  Contacts, dentures or bridgework may not be worn into surgery.  Leave your suitcase in the car.  After surgery it may be brought to your room.  For patients admitted to the hospital, discharge time will be determined by your treatment team.  Patients  discharged the day of surgery will not be allowed to drive home and must have someone with them for 24 hours.    Special instructions:   DO NOT smoke tobacco or vape for 24 hours before your procedure.  Please read over the following fact sheets that you were given. Anesthesia Post-op Instructions and Care and Recovery After Surgery      Upper Endoscopy, Adult, Care After After the procedure, it is common to have a sore throat. It is also common to have: Mild stomach pain or discomfort. Bloating. Nausea. Follow these instructions at home: The instructions below may help you care for yourself at home. Your health care provider may give you more instructions. If you have questions, ask your health care provider. If you were given a sedative during the procedure, it can affect you for several hours. Do not drive or operate machinery until your health care provider says that it is safe. If you will be going home right after the procedure, plan to have a responsible adult: Take you home from the hospital or clinic. You will not be allowed to drive. Care for you for the time you are told. Follow instructions from your health care provider about what you may eat and drink. Return to your  normal activities as told by your health care provider. Ask your health care provider what activities are safe for you. Take over-the-counter and prescription medicines only as told by your health care provider. Contact a health care provider if you: Have a sore throat that lasts longer than one day. Have trouble swallowing. Have a fever. Get help right away if you: Vomit blood or your vomit looks like coffee grounds. Have bloody, black, or tarry stools. Have a very bad sore throat or you cannot swallow. Have difficulty breathing or very bad pain in your chest or abdomen. These symptoms may be an emergency. Get help right away. Call 911. Do not wait to see if the symptoms will go away. Do not drive  yourself to the hospital. Summary After the procedure, it is common to have a sore throat, mild stomach discomfort, bloating, and nausea. If you were given a sedative during the procedure, it can affect you for several hours. Do not drive until your health care provider says that it is safe. Follow instructions from your health care provider about what you may eat and drink. Return to your normal activities as told by your health care provider. This information is not intended to replace advice given to you by your health care provider. Make sure you discuss any questions you have with your health care provider. Document Revised: 01/03/2022 Document Reviewed: 01/03/2022 Elsevier Patient Education  2024 Else[employer].Colonoscopy, Adult, Care After The following information offers guidance on how to care for yourself after your procedure. Your health care provider may also give you more specific instructions. If you have problems or questions, contact your health care provider. What can I expect after the procedure? After the procedure, it is common to have: A small amount of blood in your stool for 24 hours after the procedure. Some gas. Mild cramping or bloating of your abdomen. Follow these instructions at home: Eating and drinking  Drink enough fluid to keep your urine pale yellow. Follow instructions from your health care provider about eating or drinking restrictions. Resume your normal diet as told by your health care provider. Avoid heavy or fried foods that are hard to digest. Activity Rest as told by your health care provider. Avoid sitting for a long time without moving. Get up to take short walks every 1-2 hours. This is important to improve blood flow and breathing. Ask for help if you feel weak or unsteady. Return to your normal activities as told by your health care provider. Ask your health care provider what activities are safe for you. Managing cramping and bloating  Try  walking around when you have cramps or feel bloated. If directed, apply heat to your abdomen as told by your health care provider. Use the heat source that your health care provider recommends, such as a moist heat pack or a heating pad. Place a towel between your skin and the heat source. Leave the heat on for 20-30 minutes. Remove the heat if your skin turns bright red. This is especially important if you are unable to feel pain, heat, or cold. You have a greater risk of getting burned. General instructions If you were given a sedative during the procedure, it can affect you for several hours. Do not drive or operate machinery until your health care provider says that it is safe. For the first 24 hours after the procedure: Do not sign important documents. Do not drink alcohol. Do your regular daily activities at a slower pace than normal.  Eat soft foods that are easy to digest. Take over-the-counter and prescription medicines only as told by your health care provider. Keep all follow-up visits. This is important. Contact a health care provider if: You have blood in your stool 2-3 days after the procedure. Get help right away if: You have more than a small spotting of blood in your stool. You have large blood clots in your stool. You have swelling of your abdomen. You have nausea or vomiting. You have a fever. You have increasing pain in your abdomen that is not relieved with medicine. These symptoms may be an emergency. Get help right away. Call 911. Do not wait to see if the symptoms will go away. Do not drive yourself to the hospital. Summary After the procedure, it is common to have a small amount of blood in your stool. You may also have mild cramping and bloating of your abdomen. If you were given a sedative during the procedure, it can affect you for several hours. Do not drive or operate machinery until your health care provider says that it is safe. Get help right away if you  have a lot of blood in your stool, nausea or vomiting, a fever, or increased pain in your abdomen. This information is not intended to replace advice given to you by your health care provider. Make sure you discuss any questions you have with your health care provider. Document Revised: 11/06/2022 Document Reviewed: 05/17/2021 Elsevier Patient Education  2024 Else[employer] Inc.Monitored Anesthesia Care, Care After The following information offers guidance on how to care for yourself after your procedure. Your health care provider may also give you more specific instructions. If you have problems or questions, contact your health care provider. What can I expect after the procedure? After the procedure, it is common to have: Tiredness. Little or no memory about what happened during or after the procedure. Impaired judgment when it comes to making decisions. Nausea or vomiting. Some trouble with balance. Follow these instructions at home: For the time period you were told by your health care provider:  Rest. Do not participate in activities where you could fall or become injured. Do not drive or use machinery. Do not drink alcohol. Do not take sleeping pills or medicines that cause drowsiness. Do not make important decisions or sign legal documents. Do not take care of children on your own. Medicines Take over-the-counter and prescription medicines only as told by your health care provider. If you were prescribed antibiotics, take them as told by your health care provider. Do not stop using the antibiotic even if you start to feel better. Eating and drinking Follow instructions from your health care provider about what you may eat and drink. Drink enough fluid to keep your urine pale yellow. If you vomit: Drink clear fluids slowly and in small amounts as you are able. Clear fluids include water, ice chips, low-calorie sports drinks, and fruit juice that has water added to it (diluted fruit  juice). Eat light and bland foods in small amounts as you are able. These foods include bananas, applesauce, rice, lean meats, toast, and crackers. General instructions  Have a responsible adult stay with you for the time you are told. It is important to have someone help care for you until you are awake and alert. If you have sleep apnea, surgery and some medicines can increase your risk for breathing problems. Follow instructions from your health care provider about wearing your sleep device: When you are sleeping. This  includes during daytime naps. While taking prescription pain medicines, sleeping medicines, or medicines that make you drowsy. Do not use any products that contain nicotine or tobacco. These products include cigarettes, chewing tobacco, and vaping devices, such as e-cigarettes. If you need help quitting, ask your health care provider. Contact a health care provider if: You feel nauseous or vomit every time you eat or drink. You feel light-headed. You are still sleepy or having trouble with balance after 24 hours. You get a rash. You have a fever. You have redness or swelling around the IV site. Get help right away if: You have trouble breathing. You have new confusion after you get home. These symptoms may be an emergency. Get help right away. Call 911. Do not wait to see if the symptoms will go away. Do not drive yourself to the hospital. This information is not intended to replace advice given to you by your health care provider. Make sure you discuss any questions you have with your health care provider. Document Revised: 02/19/2022 Document Reviewed: 02/19/2022 Elsevier Patient Education  2024 [employer].
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
## 2023-12-09 ENCOUNTER — Other Ambulatory Visit (HOSPITAL_COMMUNITY): Payer: No Typology Code available for payment source

## 2023-12-09 ENCOUNTER — Ambulatory Visit (HOSPITAL_COMMUNITY): Payer: No Typology Code available for payment source

## 2023-12-09 ENCOUNTER — Ambulatory Visit (HOSPITAL_COMMUNITY): Payer: No Typology Code available for payment source | Admitting: Speech Pathology

## 2023-12-10 ENCOUNTER — Encounter (HOSPITAL_COMMUNITY)
Admission: RE | Admit: 2023-12-10 | Discharge: 2023-12-10 | Disposition: A | Discharge status: Home / Self Care | Payer: No Typology Code available for payment source | Source: Ambulatory Visit | Attending: Gastroenterology | Admitting: Gastroenterology

## 2023-12-10 ENCOUNTER — Telehealth: Payer: No Typology Code available for payment source | Admitting: Internal Medicine

## 2023-12-10 ENCOUNTER — Telehealth: Payer: Self-pay | Admitting: Internal Medicine

## 2023-12-10 DIAGNOSIS — E119 Type 2 diabetes mellitus without complications: Secondary | ICD-10-CM

## 2023-12-10 DIAGNOSIS — F17210 Nicotine dependence, cigarettes, uncomplicated: Secondary | ICD-10-CM
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
## 2023-12-10 NOTE — Pre-Procedure Instructions (Signed)
 Office messaged because he did not show for his pre-op today.

## 2023-12-11 ENCOUNTER — Encounter (HOSPITAL_COMMUNITY): Payer: Self-pay

## 2023-12-11 ENCOUNTER — Encounter (HOSPITAL_COMMUNITY): Admission: RE | Admit: 2023-12-11 | Source: Ambulatory Visit

## 2023-12-11 ENCOUNTER — Telehealth (INDEPENDENT_AMBULATORY_CARE_PROVIDER_SITE_OTHER): Payer: Self-pay | Admitting: Gastroenterology

## 2023-12-11 NOTE — Telephone Encounter (Signed)
 I called and offered to make OV with patient and he said he was not going to schedule anything at this time. I told him when he was ready to call us.

## 2023-12-11 NOTE — Telephone Encounter (Signed)
 FYI

## 2023-12-11 NOTE — Telephone Encounter (Signed)
 Left message for pt to return call to reschedule.  (TCS/EGD+/-ED Dr.Ahmed ASA 3)    Young, Lajean Saver, LPN; Lindajo Royal, RN I checked with Selena Batten to see if he could be done as a phone call per [facility].  Selena Batten states he needs bloodwork and an EKG so he can not be a phone call.  He needs to be rescheduled.  When you reschedule please convey to the patient that he must come in ahead of time for a PAT.       Previous Messages    ----- Message ----- From: Marlowe Shores, LPN Sent: 10/13/1094   3:59 PM EST To: Nobie Putnam Subject: RE: no show                                    He came by the office and picked up his prep just a few moments ago. :) ----- Message ----- From: Nobie Putnam Sent: 12/10/2023   3:48 PM EST To: Elsie Amis, RN; Marlowe Shores, LPN Subject: RE: no show                                    I think he needs to reschedule.  I tried to reach Hawi but I think she is gone for the day.  He had also left me a message a little while ago that he does not have what he needs for the TCS.  Selena Batten, Do you want to let him do his PAT day of?? ----- Message ----- From: Marlowe Shores, LPN Sent: 0/01/5408   3:10 PM EST To: Nobie Putnam Subject: RE: no show                                    Pt says he can come earlier the morning of procedure if that an option. ----- Message ----- From: Nobie Putnam Sent: 12/10/2023   2:51 PM EST To: Elsie Amis, RN; Marlowe Shores, LPN Subject: RE: no show                                    I reschedule him for tomorrow at 11:00 ----- Message ----- From: Marlowe Shores, LPN Sent: 05/08/1913   2:47 PM EST To: Elsie Amis, RN; Nobie Putnam Subject: RE: no show                                    Pt states he was not aware of pre op. Can we reschedule pt? I did speak with pt and he said he would have to figure out transportation to get to the pre op. Pt states he can do pre op the morning of the procedure ( I know that  we can not that). Thank you! (Mindy did leave a message for Eber Jones a few moments ago also.) ----- Message ----- From: Elsie Amis, RN Sent: 12/10/2023   2:03 PM EST To: Mindy S Estudillo, CMA; Marlowe Shores, LPN Subject: no show  Good afternoon! [name] did not show for his pre-op this morning.

## 2023-12-11 NOTE — Telephone Encounter (Signed)
 Pt returned call and states that he is "pissed". Pt states he was not aware of pre op appointment. Pt pre op appt was handwritten on instructions and mailed to him. Pt states that he has people come from out of town to stay with him and he has disrupted their lives. Pt states he is having issues swallowing and the EGD is the most important procedure. Pt states he is not sure if he will reschedule. He does want the provider to know what is going on.

## 2023-12-11 NOTE — Telephone Encounter (Signed)
 Thank you Shawna Orleans for clarifying and reaching out to the patient multiple times   ================  Hi Darl Pikes,  Can you please offer a follow up appointment for this patient in 3-4 weeks with any of the APPs?  Thanks,  Vista Lawman , MD Gastroenterology and Hepatology [facility]astroenter[facility]

## 2023-12-11 NOTE — Telephone Encounter (Signed)
 12/30/23 appt with MW Will continue to hold in clearance pool

## 2023-12-12 ENCOUNTER — Ambulatory Visit (HOSPITAL_COMMUNITY)
Admission: RE | Admit: 2023-12-12 | Payer: No Typology Code available for payment source | Source: Ambulatory Visit | Admitting: Gastroenterology

## 2023-12-12 ENCOUNTER — Encounter (HOSPITAL_COMMUNITY): Admission: RE | Payer: Self-pay | Source: Ambulatory Visit

## 2023-12-12 SURGERY — ESOPHAGOGASTRODUODENOSCOPY (EGD) WITH PROPOFOL
Anesthesia: Choice

## 2023-12-28 NOTE — Progress Notes (Unsigned)
 Bryan Blair, male    DOB: July 28, 1956,   MRN: [id number]   Brief patient profile:  50   yowm  active smoker  with nl pfts in 2015 (at wt 320) when by [facility]  referred to pulmonary clinic in [place]  11/07/2021 by  Lorie Phenix for doe      History of Present Illness  11/07/2021  Pulmonary/ 1st office eval/ Jonea Bukowski / [facility] Office  Chief Complaint  Patient presents with   Consult    Patient has seen Dr. Prescott Parma pulmonary with [facility]. Here to establish care in [place], recently moved.  Ref for COPD.   Dyspnea:  mb 100 ft flat has to stop when gets to mailbox or back to house  Can still do [employer]  Cough: smoker's cough w/in 30 min > mucoid  now thruout day due to "crud" 2.5 weeks - taking subtherapeutic mucinex  Sleep: in recline  around 45 degrees  x  40s / can't wear any form of cpap and resltess/ insomnia without excess hypersomnolence  SABA use: once a month  Rec Plan A = Automatic = Always=    Trelegy one click each am or breztri 2 every 12 hours  Work on inhaler technique:  Zpak  Plan B = Backup (to supplement plan A, not to replace it) Only use your albuterol inhaler as a rescue medication Ok to try albuterol 15 min before an activity (on alternating days)  that you know would usually make you short of breath For cough / congestion > mucinex 1200 mg twice daily  The key is to stop smoking completely before smoking completely stops you! Actos may be contributing to fluid retention and breathing problems  Please schedule a follow up visit in 3 months but call sooner if needed - PFT's on return   Dec 28 - Jan 28 admitted with bilateral pna > d/c no 02 and returned to [place] end of Feb 2024 by plane   12/27/2022  f/u ov/[place] office/Haroldine Redler re: AB/ still smoking 6 cigs/ day maint on Trelegy   Chief Complaint  Patient presents with   Follow-up    Pt HFU was hospitalized in [place] he was sick during the holidays (12/22) he was admitted and on a vent for approx 12-15 days.  Returned home the end of February. While admitted pt had CT scan that showed a mass in his lung  Dyspnea:  able to go with walker to MB stops half way due to sob / also one end  Cough:   some rattling in am's min white mucus  Sleeping: bed 6 in / 2 pillows SABA use: none at all  02: none  Rec Make sure you check your oxygen saturation at your highest level of activity(NOT after you stop)  to be sure it stays over 90%  Also  Ok to try albuterol x 2 puffs x 15 min before an activity (on alternating days)  that you know would usually make you short of breath  Please remember to go to the lab department   for your tests - we will call you with the results when they are available.    Echo 02/27/23 left ventricle has normal function. The left ventricle has no regional  wall motion abnormalities. Left ventricular diastolic parameters are  consistent with Grade diastolic dysfunction   2. Right ventricular systolic function is low normal. The right  ventricular size is normal.   3. Left atrial size was mildly dilated.   4. Right atrial  size was mildly dilated.   5. Mild mitral valve regurgitation.   Ct chest 03/05/23 s contrast 1. Interval decrease in size of the left upper lobe pulmonary nodule, now measuring 9 x 6 mm. 2. Similar appearance of upper lung predominant bronchiectasis with patchy ground-glass opacity and scattered areas of architectural distortion. Imaging features compatible with chronic interstitial lung disease.  Ddimer 2.23 >>>  rec venous dopplers > not done.   Please schedule a follow up visit in 3 months but call sooner if needed > did not return   Virtual Visit via Telephone Note 12/30/2023   I connected with Bryan Blair on 01/02/24 at  9:00 AM EDT by virtually  and verified that I am speaking with the correct person using two identifiers. Pt is at home and this call made from my office with no other participants    I discussed the limitations, risks, security and  privacy concerns of performing an evaluation and management service virually  and the availability of in person appointments. I also discussed with the patient that there may be a patient responsible charge related to this service. The patient expressed understanding and agreed to proceed.   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    Also denies any obvious fluctuation of symptoms with weather or environmental changes or other aggravating or alleviating factors except as outlined above    No obvious day to day or daytime variability or assoc excess/ purulent sputum or mucus plugs or hemoptysis or cp or chest tightness, subjective wheeze or overt sinus or hb symptoms.    Also denies any obvious fluctuation of symptoms with weather or environmental changes or other aggravating or alleviating factors except as outlined above.   Current Meds  Medication Sig   acetaminophen (TYLENOL) 325 MG tablet Take 650 mg by mouth 2 (two) times daily.   albuterol (VENTOLIN HFA) 108 (90 Base) MCG/ACT inhaler Inhale 1-2 puffs into the lungs every 6 (six) hours as needed for wheezing or shortness of breath.   ALPRAZolam (XANAX) 0.5 MG tablet Take 0.5 mg by mouth 3 (three) times daily as needed for anxiety.   aspirin 81 MG tablet Take 81 mg by mouth. Takes 2 tablets daily   famotidine (PEPCID) 20 MG tablet Take 20 mg by mouth daily as needed for heartburn.   furosemide (LASIX) 20 MG  tablet Take 3 tablets (60 mg total) by mouth daily.   glipiZIDE (GLUCOTROL XL) 10 MG 24 hr tablet Take 10 mg by mouth 2 (two) times daily.   ibuprofen (ADVIL) 200 MG tablet Take 200 mg by mouth every 8 (eight) hours as needed for mild pain.   isosorbide mononitrate (IMDUR) 30 MG 24 hr tablet Take 30 mg by mouth daily.   losartan (COZAAR) 25 MG tablet Take 25 mg by mouth daily.   Multiple Vitamin (MULTIVITAMIN WITH MINERALS) TABS tablet Take 1 tablet  by mouth daily.   nitroGLYCERIN (NITROSTAT) 0.4 MG SL tablet Place 0.4 mg under the tongue every 5 (five) minutes as needed for chest pain.   NON FORMULARY Take 3,000 mg by mouth 2 (two) times daily. [place]   OVER THE COUNTER MEDICATION Co q10 daily   OVER THE COUNTER MEDICATION Elderberry daily   pioglitazone (ACTOS) 45 MG tablet Take 45 mg by mouth daily.   rosuvastatin (CRESTOR) 5 MG tablet Take 1 tablet (5 mg total) by mouth at bedtime.   tamsulosin (FLOMAX) 0.4 MG CAPS capsule Take 0.4 mg by mouth daily.         Observations/Objective: Looks fine on camera / no conversational sob, no spont cough    Assessment and Plan: See problem list for active a/p's   Follow Up Instructions: Consider [facility] evaluation next if still feeling sensation of something stuck in your throat once GI evaluation complete   Also  Ok to try albuterol 15 min before an activity (on alternating days)  that you know would usually make you short of breath and see if it makes any difference and if makes none then don't take albuterol after activity unless you can't catch your breath as this means it's the resting that helps, not the albuterol.  My office will be contacting you by phone for referral to lung cancer screening   [phone]) - if you don't hear back from my office within one week,  please call us back or notify us thru MyChart and we'll address it right away.    The key is to stop smoking completely before smoking completely stops you!        Please schedule a follow up visit in 12 months but call sooner if needed     I discussed the assessment and treatment plan with the patient. The patient was provided an opportunity to ask questions and all were answered. The patient agreed with the plan and demonstrated an understanding of the instructions.   The patient was advised to call back or seek an in-person evaluation if the symptoms worsen or if the condition fails to improve as anticipated.  I provided 20 minutes of non-face-to-face time during this encounter.   Sandrea Hughs, MD                 Assessment

## 2023-12-30 ENCOUNTER — Telehealth (INDEPENDENT_AMBULATORY_CARE_PROVIDER_SITE_OTHER): Admitting: Internal Medicine

## 2023-12-30 ENCOUNTER — Encounter: Payer: Self-pay | Admitting: Internal Medicine

## 2023-12-30 DIAGNOSIS — J4489 Other specified chronic obstructive pulmonary disease: Secondary | ICD-10-CM

## 2023-12-30 DIAGNOSIS — F17210 Nicotine dependence, cigarettes, uncomplicated: Secondary | ICD-10-CM

## 2023-12-30 DIAGNOSIS — R918 Other nonspecific abnormal finding of lung field: Secondary | ICD-10-CM | POA: Diagnosis not present

## 2023-12-30 NOTE — Patient Instructions (Addendum)
 Consider [facility] evaluation next if still feeling sensation of something stuck in your throat once GI evaluation complete   Also  Ok to try albuterol 15 min before an activity (on alternating days)  that you know would usually make you short of breath and see if it makes any difference and if makes none then don't take albuterol after activity unless you can't catch your breath as this means it's the resting that helps, not the albuterol.  My office will be contacting you by phone for referral to lung cancer screening   [phone]) - if you don't hear back from my office within one week,  please call us back or notify us thru MyChart and we'll address it right away.    The key is to stop smoking completely before smoking completely stops you!       Please schedule a follow up visit in 12 months but call sooner if needed

## 2024-01-01 ENCOUNTER — Ambulatory Visit: Admitting: Gastroenterology

## 2024-01-02 NOTE — Assessment & Plan Note (Signed)
 Active smoker Onset around age 68-30s - PFTs 78 by Sn[facility] no obstruction but pos response to trelegy  11/07/2021  After extensive coaching inhaler device,  effectiveness =    75% try breztri x one week - 11/07/2021   Walked RA  x  3  lap(s) =  approx 450  ft  @ mod fast pace, stopped due to end of study, min sob  with lowest 02 sats 92%  - 12/27/2022   Walked on RA  x  3  lap(s) =  approx 450  ft  @ mod pace, stopped due to back pain and sob  with lowest 02 sats 91%    Has not needed saba despite active smoking and no maint rx at all  > Pt is Group A  terms of symptom/risk and saba approp therefore appropriate rx at this point  >>>     no change rx

## 2024-01-02 NOTE — Assessment & Plan Note (Signed)
 S/p CAP [place] xmas 2023 > 1 m admit> d/c off 02 12/04/22 Ct c/w partial clearing rec 3 m f/u > 03/04/23   Chest CT 12/04/22    Outside CT chest 10/06/2022 is available for comparison. Marked improvement in multilobar consolidation, worst in the left upper lobe, as well as in extensive peribronchovascular nodularity, without complete resolution  >>> rec f/u 3 m = 03/05/23   improved, with underlying bronchiectasis upper lobe predominant   No directed f/u needed > referred to [facility] program

## 2024-01-02 NOTE — Assessment & Plan Note (Signed)
 Counseled re importance of smoking cessation but did not meet time criteria for separate billing    Low-dose CT lung cancer screening is recommended for patients who are 57-68 years of age with a 20+ pack-year history of smoking and who are currently smoking or quit <=15 years ago. No coughing up blood  No unintentional weight loss of > 15 pounds in the last 6 months - pt is eligible for scanning yearly until age 55 > referred           Each maintenance medication was reviewed in detail including emphasizing most importantly the difference between maintenance and prns and under what circumstances the prns are to be triggered using an action plan format where appropriate.  Total time for H and P, chart review, counseling, reviewing hfa  device(s) and generating customized AVS unique to this office visit / same day charting = 20 min

## 2024-01-08 NOTE — Telephone Encounter (Signed)
 Procedure was cancelled. Will close this encounter.

## 2024-01-13 ENCOUNTER — Other Ambulatory Visit: Payer: Self-pay

## 2024-01-13 MED ORDER — ROSUVASTATIN CALCIUM 5 MG PO TABS: 5.0000 mg | ORAL_TABLET | Freq: Every day | ORAL | 0 refills | Status: AC

## 2024-01-21 ENCOUNTER — Telehealth: Payer: Self-pay | Admitting: Cardiology

## 2024-01-21 NOTE — Telephone Encounter (Signed)
 Called pt to schedule overdue recall w/Dr. Londa Rival. Pt stated that he was not ready to make an appt. He stated that he was feeling fine and was still taking his generic Lasix 3 times a day. I informed the pt that he would need to come in for an office visit to keep receiving his RX. He stated that he was not coming and he will stop taking the meds. I will be deleting the recall.

## 2024-03-19 DIAGNOSIS — Z1329 Encounter for screening for other suspected endocrine disorder: Secondary | ICD-10-CM | POA: Diagnosis not present

## 2024-03-19 DIAGNOSIS — Z0001 Encounter for general adult medical examination with abnormal findings: Secondary | ICD-10-CM | POA: Diagnosis not present

## 2024-03-19 DIAGNOSIS — R4582 Worries: Secondary | ICD-10-CM | POA: Diagnosis not present

## 2024-03-19 DIAGNOSIS — I252 Old myocardial infarction: Secondary | ICD-10-CM | POA: Diagnosis not present

## 2024-03-19 DIAGNOSIS — M5093 Cervical disc disorder, unspecified, cervicothoracic region: Secondary | ICD-10-CM | POA: Diagnosis not present

## 2024-03-19 DIAGNOSIS — Z1331 Encounter for screening for depression: Secondary | ICD-10-CM | POA: Diagnosis not present

## 2024-03-19 DIAGNOSIS — F419 Anxiety disorder, unspecified: Secondary | ICD-10-CM | POA: Diagnosis not present

## 2024-03-19 DIAGNOSIS — E1165 Type 2 diabetes mellitus with hyperglycemia: Secondary | ICD-10-CM | POA: Diagnosis not present

## 2024-03-19 DIAGNOSIS — Z6841 Body Mass Index (BMI) 40.0 and over, adult: Secondary | ICD-10-CM | POA: Diagnosis not present

## 2024-03-19 DIAGNOSIS — Z125 Encounter for screening for malignant neoplasm of prostate: Secondary | ICD-10-CM | POA: Diagnosis not present

## 2024-07-02 DIAGNOSIS — H6122 Impacted cerumen, left ear: Secondary | ICD-10-CM | POA: Diagnosis not present

## 2024-07-02 DIAGNOSIS — H6121 Impacted cerumen, right ear: Secondary | ICD-10-CM | POA: Diagnosis not present

## 2024-07-02 DIAGNOSIS — E1165 Type 2 diabetes mellitus with hyperglycemia: Secondary | ICD-10-CM | POA: Diagnosis not present

## 2024-07-02 DIAGNOSIS — H6123 Impacted cerumen, bilateral: Secondary | ICD-10-CM | POA: Diagnosis not present

## 2024-07-02 DIAGNOSIS — Z6841 Body Mass Index (BMI) 40.0 and over, adult: Secondary | ICD-10-CM | POA: Diagnosis not present

## 2024-07-22 ENCOUNTER — Encounter (INDEPENDENT_AMBULATORY_CARE_PROVIDER_SITE_OTHER): Payer: Self-pay | Admitting: Gastroenterology

## 2024-07-24 DIAGNOSIS — E1142 Type 2 diabetes mellitus with diabetic polyneuropathy: Secondary | ICD-10-CM | POA: Diagnosis not present

## 2024-07-24 DIAGNOSIS — I509 Heart failure, unspecified: Secondary | ICD-10-CM | POA: Diagnosis not present

## 2024-07-24 DIAGNOSIS — G959 Disease of spinal cord, unspecified: Secondary | ICD-10-CM | POA: Diagnosis not present

## 2024-07-24 DIAGNOSIS — Z79899 Other long term (current) drug therapy: Secondary | ICD-10-CM | POA: Diagnosis not present

## 2024-07-24 DIAGNOSIS — Z9181 History of falling: Secondary | ICD-10-CM | POA: Diagnosis not present

## 2024-07-24 DIAGNOSIS — F17210 Nicotine dependence, cigarettes, uncomplicated: Secondary | ICD-10-CM | POA: Diagnosis not present

## 2024-07-24 DIAGNOSIS — R269 Unspecified abnormalities of gait and mobility: Secondary | ICD-10-CM | POA: Diagnosis not present

## 2024-07-24 DIAGNOSIS — Z6841 Body Mass Index (BMI) 40.0 and over, adult: Secondary | ICD-10-CM | POA: Diagnosis not present

## 2024-07-24 DIAGNOSIS — Z008 Encounter for other general examination: Secondary | ICD-10-CM | POA: Diagnosis not present

## 2024-07-24 DIAGNOSIS — E1169 Type 2 diabetes mellitus with other specified complication: Secondary | ICD-10-CM | POA: Diagnosis not present

## 2024-07-24 DIAGNOSIS — E7849 Other hyperlipidemia: Secondary | ICD-10-CM | POA: Diagnosis not present
